# Patient Record
Sex: Male | Born: 1935 | Race: White | Hispanic: No | Marital: Married | Smoking: Never smoker
Health system: Southern US, Community
[De-identification: ages and names within clinical notes are randomized; demographics above are authoritative.]

## PROBLEM LIST (undated history)

## (undated) DIAGNOSIS — E538 Deficiency of other specified B group vitamins: Secondary | ICD-10-CM

## (undated) DIAGNOSIS — M79606 Pain in leg, unspecified: Secondary | ICD-10-CM

## (undated) DIAGNOSIS — R011 Cardiac murmur, unspecified: Secondary | ICD-10-CM

## (undated) DIAGNOSIS — E039 Hypothyroidism, unspecified: Secondary | ICD-10-CM

## (undated) DIAGNOSIS — I4891 Unspecified atrial fibrillation: Secondary | ICD-10-CM

## (undated) DIAGNOSIS — I2510 Atherosclerotic heart disease of native coronary artery without angina pectoris: Secondary | ICD-10-CM

## (undated) DIAGNOSIS — I495 Sick sinus syndrome: Secondary | ICD-10-CM

## (undated) DIAGNOSIS — Z87442 Personal history of urinary calculi: Secondary | ICD-10-CM

## (undated) DIAGNOSIS — I10 Essential (primary) hypertension: Secondary | ICD-10-CM

## (undated) HISTORY — DX: Personal history of urinary calculi: Z87.442

## (undated) HISTORY — DX: Sick sinus syndrome: I49.5

## (undated) HISTORY — DX: Deficiency of other specified B group vitamins: E53.8

## (undated) HISTORY — PX: ANGIOPLASTY: SHX39

## (undated) HISTORY — DX: Essential (primary) hypertension: I10

## (undated) HISTORY — DX: Cardiac murmur, unspecified: R01.1

## (undated) HISTORY — DX: Pain in leg, unspecified: M79.606

## (undated) HISTORY — DX: Atherosclerotic heart disease of native coronary artery without angina pectoris: I25.10

---

## 1998-01-31 ENCOUNTER — Encounter (HOSPITAL_COMMUNITY): Admission: RE | Admit: 1998-01-31 | Discharge: 1998-05-01 | Payer: Self-pay | Admitting: Cardiology

## 1998-05-03 ENCOUNTER — Encounter (HOSPITAL_COMMUNITY): Admission: RE | Admit: 1998-05-03 | Discharge: 1998-08-01 | Payer: Self-pay | Admitting: Cardiology

## 1998-08-02 ENCOUNTER — Encounter (HOSPITAL_COMMUNITY): Admission: RE | Admit: 1998-08-02 | Discharge: 1998-10-31 | Payer: Self-pay | Admitting: Cardiology

## 1998-11-01 ENCOUNTER — Encounter (HOSPITAL_COMMUNITY): Admission: RE | Admit: 1998-11-01 | Discharge: 1999-01-30 | Payer: Self-pay | Admitting: Cardiology

## 1999-01-31 ENCOUNTER — Encounter (HOSPITAL_COMMUNITY): Admission: RE | Admit: 1999-01-31 | Discharge: 1999-05-01 | Payer: Self-pay | Admitting: Cardiology

## 1999-02-08 ENCOUNTER — Encounter: Payer: Self-pay | Admitting: Emergency Medicine

## 1999-02-08 ENCOUNTER — Emergency Department (HOSPITAL_COMMUNITY): Admission: EM | Admit: 1999-02-08 | Discharge: 1999-02-08 | Payer: Self-pay | Admitting: Diagnostic Radiology

## 1999-02-09 ENCOUNTER — Encounter: Payer: Self-pay | Admitting: Emergency Medicine

## 1999-02-09 ENCOUNTER — Emergency Department (HOSPITAL_COMMUNITY): Admission: EM | Admit: 1999-02-09 | Discharge: 1999-02-09 | Payer: Self-pay | Admitting: Emergency Medicine

## 1999-02-14 ENCOUNTER — Encounter: Payer: Self-pay | Admitting: Family Medicine

## 1999-02-14 ENCOUNTER — Ambulatory Visit (HOSPITAL_COMMUNITY): Admission: RE | Admit: 1999-02-14 | Discharge: 1999-02-14 | Payer: Self-pay | Admitting: Family Medicine

## 1999-05-02 ENCOUNTER — Encounter (HOSPITAL_COMMUNITY): Admission: RE | Admit: 1999-05-02 | Discharge: 1999-06-02 | Payer: Self-pay | Admitting: Cardiology

## 1999-06-03 ENCOUNTER — Encounter: Admission: RE | Admit: 1999-06-03 | Discharge: 1999-09-01 | Payer: Self-pay | Admitting: Cardiology

## 1999-08-11 ENCOUNTER — Ambulatory Visit (HOSPITAL_COMMUNITY): Admission: RE | Admit: 1999-08-11 | Discharge: 1999-08-11 | Payer: Self-pay | Admitting: Cardiology

## 1999-08-11 ENCOUNTER — Encounter: Payer: Self-pay | Admitting: Cardiology

## 2000-05-21 ENCOUNTER — Inpatient Hospital Stay (HOSPITAL_COMMUNITY): Admission: AD | Admit: 2000-05-21 | Discharge: 2000-05-24 | Payer: Self-pay | Admitting: Cardiology

## 2000-05-21 ENCOUNTER — Encounter: Payer: Self-pay | Admitting: Cardiology

## 2000-09-16 ENCOUNTER — Encounter: Payer: Self-pay | Admitting: Cardiology

## 2000-09-16 ENCOUNTER — Inpatient Hospital Stay (HOSPITAL_COMMUNITY): Admission: AD | Admit: 2000-09-16 | Discharge: 2000-09-19 | Payer: Self-pay | Admitting: Cardiology

## 2001-06-16 ENCOUNTER — Emergency Department (HOSPITAL_COMMUNITY): Admission: EM | Admit: 2001-06-16 | Discharge: 2001-06-16 | Payer: Self-pay | Admitting: Emergency Medicine

## 2001-06-16 ENCOUNTER — Encounter: Payer: Self-pay | Admitting: Emergency Medicine

## 2001-10-21 ENCOUNTER — Ambulatory Visit (HOSPITAL_BASED_OUTPATIENT_CLINIC_OR_DEPARTMENT_OTHER): Admission: RE | Admit: 2001-10-21 | Discharge: 2001-10-21 | Payer: Self-pay | Admitting: Orthopedic Surgery

## 2002-07-08 ENCOUNTER — Encounter: Payer: Self-pay | Admitting: Family Medicine

## 2002-07-08 ENCOUNTER — Ambulatory Visit (HOSPITAL_COMMUNITY): Admission: RE | Admit: 2002-07-08 | Discharge: 2002-07-08 | Payer: Self-pay | Admitting: Family Medicine

## 2003-05-09 ENCOUNTER — Emergency Department (HOSPITAL_COMMUNITY): Admission: EM | Admit: 2003-05-09 | Discharge: 2003-05-09 | Payer: Self-pay | Admitting: Emergency Medicine

## 2003-05-09 ENCOUNTER — Encounter: Payer: Self-pay | Admitting: Emergency Medicine

## 2004-01-04 ENCOUNTER — Ambulatory Visit (HOSPITAL_COMMUNITY): Admission: RE | Admit: 2004-01-04 | Discharge: 2004-01-04 | Payer: Self-pay | Admitting: Family Medicine

## 2004-07-03 ENCOUNTER — Ambulatory Visit (HOSPITAL_COMMUNITY): Admission: RE | Admit: 2004-07-03 | Discharge: 2004-07-03 | Payer: Self-pay | Admitting: Cardiology

## 2004-08-05 ENCOUNTER — Encounter (INDEPENDENT_AMBULATORY_CARE_PROVIDER_SITE_OTHER): Payer: Self-pay | Admitting: *Deleted

## 2004-08-05 ENCOUNTER — Ambulatory Visit: Admission: RE | Admit: 2004-08-05 | Discharge: 2004-08-05 | Payer: Self-pay | Admitting: Critical Care Medicine

## 2004-09-04 ENCOUNTER — Ambulatory Visit: Payer: Self-pay | Admitting: Critical Care Medicine

## 2004-09-11 ENCOUNTER — Ambulatory Visit: Payer: Self-pay | Admitting: Cardiology

## 2004-09-12 ENCOUNTER — Ambulatory Visit: Payer: Self-pay | Admitting: Internal Medicine

## 2004-09-18 ENCOUNTER — Ambulatory Visit: Payer: Self-pay | Admitting: Internal Medicine

## 2004-09-23 ENCOUNTER — Ambulatory Visit: Payer: Self-pay

## 2004-10-02 ENCOUNTER — Ambulatory Visit: Payer: Self-pay | Admitting: Internal Medicine

## 2004-10-16 ENCOUNTER — Ambulatory Visit: Payer: Self-pay | Admitting: *Deleted

## 2004-11-06 ENCOUNTER — Ambulatory Visit: Payer: Self-pay | Admitting: Internal Medicine

## 2004-12-04 ENCOUNTER — Ambulatory Visit: Payer: Self-pay | Admitting: Critical Care Medicine

## 2004-12-04 ENCOUNTER — Ambulatory Visit: Payer: Self-pay | Admitting: Cardiology

## 2004-12-05 ENCOUNTER — Encounter: Admission: RE | Admit: 2004-12-05 | Discharge: 2004-12-05 | Payer: Self-pay | Admitting: Critical Care Medicine

## 2004-12-19 ENCOUNTER — Ambulatory Visit: Payer: Self-pay | Admitting: Internal Medicine

## 2005-02-03 ENCOUNTER — Ambulatory Visit (HOSPITAL_COMMUNITY): Admission: RE | Admit: 2005-02-03 | Discharge: 2005-02-03 | Payer: Self-pay | Admitting: Internal Medicine

## 2005-02-05 ENCOUNTER — Ambulatory Visit: Payer: Self-pay | Admitting: Internal Medicine

## 2005-02-10 ENCOUNTER — Ambulatory Visit: Payer: Self-pay | Admitting: Cardiology

## 2005-02-17 ENCOUNTER — Ambulatory Visit: Payer: Self-pay | Admitting: Cardiology

## 2005-02-17 ENCOUNTER — Ambulatory Visit (HOSPITAL_COMMUNITY): Admission: RE | Admit: 2005-02-17 | Discharge: 2005-02-17 | Payer: Self-pay | Admitting: Internal Medicine

## 2005-02-26 ENCOUNTER — Ambulatory Visit: Payer: Self-pay | Admitting: Cardiology

## 2005-03-10 ENCOUNTER — Ambulatory Visit (HOSPITAL_COMMUNITY): Admission: RE | Admit: 2005-03-10 | Discharge: 2005-03-10 | Payer: Self-pay | Admitting: Internal Medicine

## 2005-03-19 ENCOUNTER — Ambulatory Visit: Payer: Self-pay | Admitting: Cardiology

## 2005-03-19 ENCOUNTER — Ambulatory Visit: Payer: Self-pay | Admitting: Critical Care Medicine

## 2005-04-16 ENCOUNTER — Ambulatory Visit: Payer: Self-pay | Admitting: Internal Medicine

## 2005-05-08 ENCOUNTER — Ambulatory Visit: Payer: Self-pay | Admitting: Cardiology

## 2005-05-29 ENCOUNTER — Ambulatory Visit: Payer: Self-pay | Admitting: Cardiology

## 2005-06-11 ENCOUNTER — Ambulatory Visit: Payer: Self-pay | Admitting: Internal Medicine

## 2005-07-02 ENCOUNTER — Ambulatory Visit: Payer: Self-pay | Admitting: Cardiology

## 2005-07-30 ENCOUNTER — Ambulatory Visit: Payer: Self-pay | Admitting: Cardiology

## 2005-08-27 ENCOUNTER — Ambulatory Visit: Payer: Self-pay | Admitting: Internal Medicine

## 2005-09-23 ENCOUNTER — Ambulatory Visit: Payer: Self-pay | Admitting: Cardiology

## 2005-10-16 ENCOUNTER — Ambulatory Visit: Payer: Self-pay | Admitting: Cardiology

## 2005-11-13 ENCOUNTER — Ambulatory Visit: Payer: Self-pay | Admitting: Internal Medicine

## 2006-01-13 ENCOUNTER — Ambulatory Visit: Payer: Self-pay | Admitting: Cardiology

## 2006-01-28 ENCOUNTER — Ambulatory Visit: Payer: Self-pay | Admitting: Internal Medicine

## 2006-02-18 ENCOUNTER — Ambulatory Visit: Payer: Self-pay | Admitting: Cardiology

## 2006-03-18 ENCOUNTER — Ambulatory Visit: Payer: Self-pay | Admitting: Cardiology

## 2006-04-15 ENCOUNTER — Ambulatory Visit: Payer: Self-pay | Admitting: Cardiology

## 2006-04-29 ENCOUNTER — Ambulatory Visit: Payer: Self-pay | Admitting: Cardiology

## 2006-05-13 ENCOUNTER — Ambulatory Visit: Payer: Self-pay | Admitting: Cardiology

## 2006-06-22 ENCOUNTER — Ambulatory Visit: Payer: Self-pay | Admitting: Cardiology

## 2006-07-20 ENCOUNTER — Ambulatory Visit: Payer: Self-pay | Admitting: Cardiology

## 2006-08-17 ENCOUNTER — Ambulatory Visit: Payer: Self-pay | Admitting: Cardiology

## 2006-09-07 ENCOUNTER — Ambulatory Visit: Payer: Self-pay | Admitting: *Deleted

## 2006-10-05 ENCOUNTER — Ambulatory Visit: Payer: Self-pay | Admitting: Cardiology

## 2006-10-21 ENCOUNTER — Ambulatory Visit: Payer: Self-pay | Admitting: Cardiology

## 2006-10-28 ENCOUNTER — Ambulatory Visit: Payer: Self-pay | Admitting: Cardiology

## 2006-11-04 ENCOUNTER — Ambulatory Visit: Payer: Self-pay | Admitting: Cardiology

## 2006-11-25 ENCOUNTER — Ambulatory Visit: Payer: Self-pay | Admitting: Cardiology

## 2007-01-17 ENCOUNTER — Ambulatory Visit: Payer: Self-pay | Admitting: Internal Medicine

## 2007-02-15 ENCOUNTER — Ambulatory Visit: Payer: Self-pay | Admitting: *Deleted

## 2007-03-10 ENCOUNTER — Ambulatory Visit: Payer: Self-pay | Admitting: Cardiology

## 2007-03-24 ENCOUNTER — Ambulatory Visit: Payer: Self-pay | Admitting: Cardiology

## 2007-04-19 ENCOUNTER — Ambulatory Visit: Payer: Self-pay | Admitting: Cardiology

## 2007-05-17 ENCOUNTER — Ambulatory Visit: Payer: Self-pay | Admitting: Cardiovascular Disease

## 2007-06-17 ENCOUNTER — Ambulatory Visit: Payer: Self-pay | Admitting: Cardiology

## 2007-07-15 ENCOUNTER — Ambulatory Visit: Payer: Self-pay | Admitting: Cardiology

## 2007-07-27 ENCOUNTER — Ambulatory Visit: Payer: Self-pay | Admitting: Internal Medicine

## 2007-07-27 LAB — CONVERTED CEMR LAB: Vitamin B-12: 420 pg/mL (ref 211–911)

## 2007-08-12 ENCOUNTER — Ambulatory Visit: Payer: Self-pay | Admitting: Internal Medicine

## 2007-09-09 ENCOUNTER — Ambulatory Visit: Payer: Self-pay | Admitting: Cardiology

## 2007-09-09 LAB — CONVERTED CEMR LAB
ALT: 20 units/L (ref 0–53)
AST: 22 units/L (ref 0–37)
Albumin: 3.7 g/dL (ref 3.5–5.2)
Alkaline Phosphatase: 55 units/L (ref 39–117)
Bilirubin, Direct: 0.1 mg/dL (ref 0.0–0.3)
Cholesterol: 123 mg/dL (ref 0–200)
HDL: 38.4 mg/dL — ABNORMAL LOW (ref >39.0)
LDL Cholesterol: 65 mg/dL (ref 0–99)
Total Bilirubin: 0.8 mg/dL (ref 0.3–1.2)
Total CHOL/HDL Ratio: 3.2
Total Protein: 6.9 g/dL (ref 6.0–8.3)
Triglycerides: 97 mg/dL (ref 0–149)
VLDL: 19 mg/dL (ref 0–40)

## 2007-10-04 ENCOUNTER — Ambulatory Visit: Payer: Self-pay | Admitting: Cardiology

## 2007-11-01 ENCOUNTER — Ambulatory Visit: Payer: Self-pay | Admitting: Cardiology

## 2007-11-10 ENCOUNTER — Ambulatory Visit: Payer: Self-pay | Admitting: Cardiology

## 2007-11-11 ENCOUNTER — Ambulatory Visit: Payer: Self-pay | Admitting: Cardiology

## 2007-11-11 LAB — CONVERTED CEMR LAB
ALT: 29 units/L (ref 0–53)
AST: 28 units/L (ref 0–37)
Albumin: 4.1 g/dL (ref 3.5–5.2)
Alkaline Phosphatase: 58 units/L (ref 39–117)
BUN: 27 mg/dL — ABNORMAL HIGH (ref 6–23)
Basophils Absolute: 0 10*3/uL (ref 0.0–0.1)
Basophils Relative: 0.6 % (ref 0.0–1.0)
Bilirubin, Direct: 0.1 mg/dL (ref 0.0–0.3)
CO2: 29 meq/L (ref 19–32)
Calcium: 9.4 mg/dL (ref 8.4–10.5)
Chloride: 103 meq/L (ref 96–112)
Cholesterol: 160 mg/dL (ref 0–200)
Creatinine, Ser: 1.4 mg/dL (ref 0.4–1.5)
Eosinophils Absolute: 0.2 10*3/uL (ref 0.0–0.6)
Eosinophils Relative: 2.2 % (ref 0.0–5.0)
GFR calc Af Amer: 62 mL/min
GFR calc non Af Amer: 52 mL/min
Glucose, Bld: 124 mg/dL — ABNORMAL HIGH (ref 70–99)
HCT: 40.9 % (ref 39.0–52.0)
HDL: 48.9 mg/dL (ref >39.0)
Hemoglobin: 14.1 g/dL (ref 13.0–17.0)
LDL Cholesterol: 83 mg/dL (ref 0–99)
Lymphocytes Relative: 26.1 % (ref 12.0–46.0)
MCHC: 34.5 g/dL (ref 30.0–36.0)
MCV: 98.4 fL (ref 78.0–100.0)
Monocytes Absolute: 0.8 10*3/uL — ABNORMAL HIGH (ref 0.2–0.7)
Monocytes Relative: 10.9 % (ref 3.0–11.0)
Neutro Abs: 4.5 10*3/uL (ref 1.4–7.7)
Neutrophils Relative %: 60.2 % (ref 43.0–77.0)
Platelets: 238 10*3/uL (ref 150–400)
Potassium: 5.3 meq/L — ABNORMAL HIGH (ref 3.5–5.1)
RBC: 4.16 M/uL — ABNORMAL LOW (ref 4.22–5.81)
RDW: 12.9 % (ref 11.5–14.6)
Sodium: 140 meq/L (ref 135–145)
TSH: 0.34 microintl units/mL — ABNORMAL LOW (ref 0.35–5.50)
Total Bilirubin: 1.2 mg/dL (ref 0.3–1.2)
Total CHOL/HDL Ratio: 3.3
Total Protein: 7.5 g/dL (ref 6.0–8.3)
Triglycerides: 140 mg/dL (ref 0–149)
VLDL: 28 mg/dL (ref 0–40)
WBC: 7.5 10*3/uL (ref 4.5–10.5)

## 2007-11-18 ENCOUNTER — Ambulatory Visit: Payer: Self-pay | Admitting: Cardiology

## 2007-12-26 ENCOUNTER — Ambulatory Visit: Payer: Self-pay | Admitting: Cardiology

## 2007-12-26 ENCOUNTER — Ambulatory Visit: Payer: Self-pay | Admitting: Internal Medicine

## 2007-12-26 LAB — CONVERTED CEMR LAB
BUN: 19 mg/dL (ref 6–23)
CO2: 29 meq/L (ref 19–32)
Calcium: 9.2 mg/dL (ref 8.4–10.5)
Chloride: 103 meq/L (ref 96–112)
Creatinine, Ser: 1.3 mg/dL (ref 0.4–1.5)
GFR calc Af Amer: 68 mL/min
GFR calc non Af Amer: 56 mL/min
Glucose, Bld: 135 mg/dL — ABNORMAL HIGH (ref 70–99)
Potassium: 4.2 meq/L (ref 3.5–5.1)
Sodium: 140 meq/L (ref 135–145)
TSH: 1.74 microintl units/mL (ref 0.35–5.50)

## 2008-01-13 DIAGNOSIS — Z9861 Coronary angioplasty status: Secondary | ICD-10-CM

## 2008-01-13 DIAGNOSIS — I2510 Atherosclerotic heart disease of native coronary artery without angina pectoris: Secondary | ICD-10-CM

## 2008-01-13 DIAGNOSIS — E538 Deficiency of other specified B group vitamins: Secondary | ICD-10-CM

## 2008-01-13 DIAGNOSIS — I10 Essential (primary) hypertension: Secondary | ICD-10-CM

## 2008-01-13 DIAGNOSIS — Z87442 Personal history of urinary calculi: Secondary | ICD-10-CM

## 2008-01-24 ENCOUNTER — Ambulatory Visit: Payer: Self-pay | Admitting: Cardiology

## 2008-02-21 ENCOUNTER — Ambulatory Visit: Payer: Self-pay | Admitting: Cardiology

## 2008-03-20 ENCOUNTER — Ambulatory Visit: Payer: Self-pay | Admitting: Internal Medicine

## 2008-04-12 ENCOUNTER — Ambulatory Visit: Payer: Self-pay | Admitting: Cardiology

## 2008-05-10 ENCOUNTER — Ambulatory Visit: Payer: Self-pay | Admitting: Cardiology

## 2008-06-07 ENCOUNTER — Ambulatory Visit: Payer: Self-pay | Admitting: Cardiology

## 2008-07-05 ENCOUNTER — Ambulatory Visit: Payer: Self-pay | Admitting: Cardiology

## 2008-07-10 ENCOUNTER — Ambulatory Visit: Payer: Self-pay | Admitting: Internal Medicine

## 2008-07-16 LAB — CONVERTED CEMR LAB: Vitamin B-12: 308 pg/mL (ref 211–911)

## 2008-07-21 ENCOUNTER — Emergency Department (HOSPITAL_COMMUNITY): Admission: EM | Admit: 2008-07-21 | Discharge: 2008-07-21 | Payer: Self-pay | Admitting: Emergency Medicine

## 2008-08-02 ENCOUNTER — Ambulatory Visit: Payer: Self-pay | Admitting: Internal Medicine

## 2008-08-23 ENCOUNTER — Ambulatory Visit: Payer: Self-pay | Admitting: Cardiology

## 2008-08-31 ENCOUNTER — Encounter: Admission: RE | Admit: 2008-08-31 | Discharge: 2008-08-31 | Payer: Self-pay | Admitting: Orthopedic Surgery

## 2008-09-20 ENCOUNTER — Ambulatory Visit: Payer: Self-pay | Admitting: Internal Medicine

## 2008-10-18 ENCOUNTER — Ambulatory Visit: Payer: Self-pay | Admitting: Cardiology

## 2008-11-06 ENCOUNTER — Ambulatory Visit: Payer: Self-pay | Admitting: Cardiology

## 2008-11-13 ENCOUNTER — Ambulatory Visit: Payer: Self-pay | Admitting: Cardiovascular Disease

## 2008-11-13 ENCOUNTER — Ambulatory Visit: Payer: Self-pay | Admitting: Cardiology

## 2008-11-13 LAB — CONVERTED CEMR LAB
ALT: 22 units/L (ref 0–53)
AST: 22 units/L (ref 0–37)
Albumin: 3.8 g/dL (ref 3.5–5.2)
Alkaline Phosphatase: 49 units/L (ref 39–117)
BUN: 26 mg/dL — ABNORMAL HIGH (ref 6–23)
Basophils Absolute: 0.1 10*3/uL (ref 0.0–0.1)
Basophils Relative: 1.3 % (ref 0.0–3.0)
Bilirubin, Direct: 0.1 mg/dL (ref 0.0–0.3)
CO2: 30 meq/L (ref 19–32)
Calcium: 9 mg/dL (ref 8.4–10.5)
Chloride: 106 meq/L (ref 96–112)
Cholesterol: 167 mg/dL (ref 0–200)
Creatinine, Ser: 1.4 mg/dL (ref 0.4–1.5)
Eosinophils Absolute: 0.2 10*3/uL (ref 0.0–0.7)
Eosinophils Relative: 3.2 % (ref 0.0–5.0)
GFR calc Af Amer: 62 mL/min
GFR calc non Af Amer: 51 mL/min
Glucose, Bld: 126 mg/dL — ABNORMAL HIGH (ref 70–99)
HCT: 38.1 % — ABNORMAL LOW (ref 39.0–52.0)
HDL: 43.7 mg/dL (ref >39.0)
Hemoglobin: 13 g/dL (ref 13.0–17.0)
LDL Cholesterol: 99 mg/dL (ref 0–99)
Lymphocytes Relative: 31.4 % (ref 12.0–46.0)
MCHC: 34.2 g/dL (ref 30.0–36.0)
MCV: 99.6 fL (ref 78.0–100.0)
Monocytes Absolute: 0.7 10*3/uL (ref 0.1–1.0)
Monocytes Relative: 11.3 % (ref 3.0–12.0)
Neutro Abs: 3.5 10*3/uL (ref 1.4–7.7)
Neutrophils Relative %: 52.8 % (ref 43.0–77.0)
Platelets: 190 10*3/uL (ref 150–400)
Potassium: 4.6 meq/L (ref 3.5–5.1)
RBC: 3.83 M/uL — ABNORMAL LOW (ref 4.22–5.81)
RDW: 12.7 % (ref 11.5–14.6)
Sodium: 140 meq/L (ref 135–145)
TSH: 6.57 microintl units/mL — ABNORMAL HIGH (ref 0.35–5.50)
Total Bilirubin: 0.7 mg/dL (ref 0.3–1.2)
Total CHOL/HDL Ratio: 3.8
Total Protein: 7.2 g/dL (ref 6.0–8.3)
Triglycerides: 120 mg/dL (ref 0–149)
VLDL: 24 mg/dL (ref 0–40)
WBC: 6.6 10*3/uL (ref 4.5–10.5)

## 2008-12-25 ENCOUNTER — Ambulatory Visit: Payer: Self-pay | Admitting: Cardiovascular Disease

## 2009-01-08 ENCOUNTER — Ambulatory Visit: Payer: Self-pay | Admitting: Cardiology

## 2009-01-11 ENCOUNTER — Ambulatory Visit: Payer: Self-pay | Admitting: Cardiology

## 2009-01-11 LAB — CONVERTED CEMR LAB: TSH: 2.93 microintl units/mL (ref 0.35–5.50)

## 2009-01-29 ENCOUNTER — Ambulatory Visit: Payer: Self-pay | Admitting: Cardiology

## 2009-02-26 ENCOUNTER — Ambulatory Visit: Payer: Self-pay | Admitting: Cardiology

## 2009-03-07 ENCOUNTER — Telehealth: Payer: Self-pay | Admitting: Internal Medicine

## 2009-03-12 ENCOUNTER — Ambulatory Visit: Payer: Self-pay | Admitting: Internal Medicine

## 2009-03-12 LAB — CONVERTED CEMR LAB: Vitamin B-12: 684 pg/mL (ref 211–911)

## 2009-03-15 ENCOUNTER — Ambulatory Visit: Payer: Self-pay | Admitting: Cardiology

## 2009-03-22 ENCOUNTER — Encounter: Admission: RE | Admit: 2009-03-22 | Discharge: 2009-03-22 | Payer: Self-pay | Admitting: Internal Medicine

## 2009-04-02 ENCOUNTER — Encounter: Payer: Self-pay | Admitting: *Deleted

## 2009-04-09 ENCOUNTER — Ambulatory Visit: Payer: Self-pay | Admitting: Cardiology

## 2009-04-09 LAB — CONVERTED CEMR LAB
POC INR: 1.7
Protime: 18.1

## 2009-05-07 ENCOUNTER — Encounter (INDEPENDENT_AMBULATORY_CARE_PROVIDER_SITE_OTHER): Payer: Self-pay | Admitting: Cardiology

## 2009-05-07 ENCOUNTER — Ambulatory Visit: Payer: Self-pay | Admitting: Cardiology

## 2009-05-07 LAB — CONVERTED CEMR LAB
POC INR: 2.4
Prothrombin Time: 18.7 s

## 2009-05-08 ENCOUNTER — Encounter: Payer: Self-pay | Admitting: *Deleted

## 2009-06-04 ENCOUNTER — Ambulatory Visit: Payer: Self-pay | Admitting: Cardiovascular Disease

## 2009-06-04 LAB — CONVERTED CEMR LAB
POC INR: 2.3
Prothrombin Time: 18.7 s

## 2009-06-28 ENCOUNTER — Telehealth: Payer: Self-pay | Admitting: Cardiovascular Disease

## 2009-06-28 ENCOUNTER — Ambulatory Visit: Payer: Self-pay | Admitting: Internal Medicine

## 2009-06-28 LAB — CONVERTED CEMR LAB: POC INR: 2.5

## 2009-06-30 ENCOUNTER — Telehealth (INDEPENDENT_AMBULATORY_CARE_PROVIDER_SITE_OTHER): Payer: Self-pay | Admitting: Physician Assistant

## 2009-07-24 ENCOUNTER — Telehealth: Payer: Self-pay | Admitting: Cardiovascular Disease

## 2009-07-25 ENCOUNTER — Ambulatory Visit: Payer: Self-pay | Admitting: Cardiology

## 2009-07-25 LAB — CONVERTED CEMR LAB: POC INR: 3.4

## 2009-08-08 ENCOUNTER — Ambulatory Visit: Payer: Self-pay | Admitting: Cardiology

## 2009-08-08 LAB — CONVERTED CEMR LAB: POC INR: 2.7

## 2009-09-05 ENCOUNTER — Ambulatory Visit: Payer: Self-pay | Admitting: Internal Medicine

## 2009-09-05 LAB — CONVERTED CEMR LAB: POC INR: 2.4

## 2009-10-03 ENCOUNTER — Ambulatory Visit: Payer: Self-pay | Admitting: Cardiology

## 2009-10-31 ENCOUNTER — Ambulatory Visit: Payer: Self-pay | Admitting: Cardiovascular Disease

## 2009-10-31 LAB — CONVERTED CEMR LAB: POC INR: 2.4

## 2009-11-05 ENCOUNTER — Ambulatory Visit: Payer: Self-pay | Admitting: Cardiology

## 2009-11-22 ENCOUNTER — Encounter: Payer: Self-pay | Admitting: Cardiology

## 2009-11-25 ENCOUNTER — Ambulatory Visit: Payer: Self-pay | Admitting: Cardiology

## 2009-11-27 ENCOUNTER — Encounter: Payer: Self-pay | Admitting: Cardiology

## 2009-11-28 LAB — CONVERTED CEMR LAB
BUN: 26 mg/dL — ABNORMAL HIGH (ref 6–23)
CO2: 28 meq/L (ref 19–32)
Calcium: 9.1 mg/dL (ref 8.4–10.5)
Chloride: 103 meq/L (ref 96–112)
Creatinine, Ser: 1.3 mg/dL (ref 0.4–1.5)
GFR calc non Af Amer: 55.88 mL/min (ref >60)
Glucose, Bld: 94 mg/dL (ref 70–99)
Potassium: 4.7 meq/L (ref 3.5–5.1)
Sodium: 139 meq/L (ref 135–145)

## 2009-12-03 ENCOUNTER — Ambulatory Visit: Payer: Self-pay | Admitting: Cardiology

## 2009-12-03 ENCOUNTER — Ambulatory Visit: Payer: Self-pay | Admitting: Internal Medicine

## 2009-12-03 LAB — CONVERTED CEMR LAB: POC INR: 2.3

## 2010-01-07 ENCOUNTER — Ambulatory Visit: Payer: Self-pay | Admitting: Cardiovascular Disease

## 2010-01-07 LAB — CONVERTED CEMR LAB: POC INR: 2.4

## 2010-01-30 ENCOUNTER — Telehealth: Payer: Self-pay | Admitting: Internal Medicine

## 2010-02-04 ENCOUNTER — Ambulatory Visit: Payer: Self-pay | Admitting: Cardiology

## 2010-02-04 LAB — CONVERTED CEMR LAB: POC INR: 1.8

## 2010-03-04 ENCOUNTER — Ambulatory Visit: Payer: Self-pay | Admitting: Cardiology

## 2010-03-04 LAB — CONVERTED CEMR LAB: POC INR: 2.1

## 2010-03-17 ENCOUNTER — Encounter: Payer: Self-pay | Admitting: Cardiovascular Disease

## 2010-04-01 ENCOUNTER — Ambulatory Visit: Payer: Self-pay | Admitting: Cardiology

## 2010-04-01 LAB — CONVERTED CEMR LAB: POC INR: 1.7

## 2010-04-11 ENCOUNTER — Telehealth: Payer: Self-pay | Admitting: Internal Medicine

## 2010-04-17 ENCOUNTER — Ambulatory Visit: Payer: Self-pay | Admitting: Cardiovascular Disease

## 2010-04-17 LAB — CONVERTED CEMR LAB: POC INR: 2.3

## 2010-05-08 ENCOUNTER — Ambulatory Visit: Payer: Self-pay | Admitting: Internal Medicine

## 2010-05-09 LAB — CONVERTED CEMR LAB: Vitamin B-12: 899 pg/mL (ref 211–911)

## 2010-05-15 ENCOUNTER — Ambulatory Visit: Payer: Self-pay | Admitting: Cardiology

## 2010-05-15 LAB — CONVERTED CEMR LAB: POC INR: 1.8

## 2010-06-05 ENCOUNTER — Ambulatory Visit: Payer: Self-pay | Admitting: Internal Medicine

## 2010-06-05 LAB — CONVERTED CEMR LAB: POC INR: 2.7

## 2010-06-19 ENCOUNTER — Encounter: Payer: Self-pay | Admitting: Cardiovascular Disease

## 2010-06-27 ENCOUNTER — Ambulatory Visit: Payer: Self-pay | Admitting: Internal Medicine

## 2010-06-27 LAB — CONVERTED CEMR LAB: POC INR: 2.2

## 2010-08-19 ENCOUNTER — Telehealth: Payer: Self-pay | Admitting: Cardiology

## 2010-08-20 ENCOUNTER — Ambulatory Visit: Payer: Self-pay | Admitting: Internal Medicine

## 2010-08-20 LAB — CONVERTED CEMR LAB: POC INR: 2.1

## 2010-09-16 ENCOUNTER — Ambulatory Visit: Payer: Self-pay | Admitting: Cardiovascular Disease

## 2010-09-16 LAB — CONVERTED CEMR LAB: POC INR: 1.9

## 2010-10-14 ENCOUNTER — Ambulatory Visit: Payer: Self-pay | Admitting: Internal Medicine

## 2010-11-11 ENCOUNTER — Ambulatory Visit: Admission: RE | Admit: 2010-11-11 | Discharge: 2010-11-11 | Payer: Self-pay | Source: Home / Self Care

## 2010-11-11 LAB — CONVERTED CEMR LAB: POC INR: 2.5

## 2010-11-12 ENCOUNTER — Encounter: Payer: Self-pay | Admitting: Cardiovascular Disease

## 2010-11-12 ENCOUNTER — Ambulatory Visit
Admission: RE | Admit: 2010-11-12 | Discharge: 2010-11-12 | Payer: Self-pay | Source: Home / Self Care | Attending: Cardiovascular Disease | Admitting: Cardiovascular Disease

## 2010-11-12 ENCOUNTER — Other Ambulatory Visit: Payer: Self-pay | Admitting: Internal Medicine

## 2010-11-12 ENCOUNTER — Ambulatory Visit
Admission: RE | Admit: 2010-11-12 | Discharge: 2010-11-12 | Payer: Self-pay | Source: Home / Self Care | Attending: Internal Medicine | Admitting: Internal Medicine

## 2010-11-12 LAB — VITAMIN B12: Vitamin B-12: 281 pg/mL (ref 211–911)

## 2010-12-04 NOTE — Progress Notes (Signed)
Summary: refill request   Phone Note Refill Request Message from:  Patient on August 19, 2010 9:58 AM  Refills Requested: Medication #1:  QUINAPRIL HCL 40 MG TABS 2 tablet by mouth once daily [facility]/pt states this was req 10-15 and needs called in asap he is out   Method Requested: Telephone to Pharmacy Initial call taken by: Glynda Jaeger,  August 19, 2010 9:59 AM    Prescriptions: QUINAPRIL HCL 40 MG TABS (QUINAPRIL HCL) 2 tablet by mouth once daily  #60 x 6   Entered by:   Burnett Kanaris, CNA   Authorized by:   Lenoria Farrier, MD, [facility]   Signed by:   Burnett Kanaris, CNA on 08/19/2010   Method used:   Electronically to        [facility]  [phone]* (retail)       [address] [place], [place]  [address]       Ph: [phone] or [phone]       Fax: [phone]   RxID:   [id number]

## 2010-12-04 NOTE — Medication Information (Signed)
Summary: rov-tp  Anticoagulant Therapy  Managed by: Cloyde Reams, RN, BSN Referring MD: Dr Unknown PCP: Lucky Cowboy,  MD Supervising MD: Gala Romney MD, Reuel Boom Indication 1: Atrial Fibrillation (ICD-427.31) Indication 2: Patient has 2.5 mg tablets (ICD-2.5) Lab Used: [facility]  Site: [facility] INR POC 2.3 INR RANGE 2 - 3  Dietary changes: no    Health status changes: no    Bleeding/hemorrhagic complications: no    Recent/future hospitalizations: no    Any changes in medication regimen? no    Recent/future dental: no  Any missed doses?: no       Is patient compliant with meds? yes       Allergies (verified): No Known Drug Allergies  Anticoagulation Management History:      The patient is taking warfarin and comes in today for a routine follow up visit.  Positive risk factors for bleeding include an age of 75 years or older.  The bleeding index is 'intermediate risk'.  Positive CHADS2 values include History of HTN and Age > 66 years old.  The start date was 05/23/2000.  Anticoagulation responsible provider: Karess Harner MD, Reuel Boom.  INR POC: 2.3.  Cuvette Lot#: [id number].  Exp: 02/2011.    Anticoagulation Management Assessment/Plan:      The patient's current anticoagulation dose is Warfarin sodium 2.5 mg tabs: Take as directed by coumadin clinic..  The target INR is 2 - 3.  The next INR is due 01/07/2010.  Anticoagulation instructions were given to patient.  Results were reviewed/authorized by Cloyde Reams, RN, BSN.  He was notified by Cloyde Reams RN.         Prior Anticoagulation Instructions: The patient is to continue with the same dose of coumadin.  This dosage includes:   Current Anticoagulation Instructions: INR 2.3  Continue on same dosage 2.5mg  daily.  Recheck in 4 weeks.

## 2010-12-04 NOTE — Assessment & Plan Note (Signed)
Summary: f1y  Medications Added SIMVASTATIN 40 MG TABS (SIMVASTATIN) 1/2 tab by mouth once daily      Allergies Added: NKDA  Primary Provider:  Lucky Cowboy,  MD  CC:  yearly visit.  History of Present Illness: The patient is 77 years old and return for management of CAD and paroxysmal atrial fibrillation. He has had multiple prior PCI procedures and was last catheterized in 2000. he has a history of paroxysmal atrial fibrillation and is on amiodarone and Coumadin.  He has done quite well over the past year and has had no recent chest pain shortness of breath or palpitations.  He does not check his blood pressures at home. His blood pressure was borderline on his last reading here in the office.   His wife had a stroke in September but seems to be recovering   Current Problems (verified): 1)  Murmur  (ICD-785.2) 2)  Encounter For Long-term Use of Other Medications  (ICD-V58.69) 3)  Special Screening For Malignant Neoplasms Colon  (ICD-V76.51) 4)  Vitamin B12 Deficiency  (ICD-266.2) 5)  Nephrolithiasis, Hx of  (ICD-V13.01) 6)  Atrial Fibrillation, Paroxysmal, Hx of  (ICD-V12.50) 7)  Sinus Bradycardia  (ICD-427.81) 8)  Coronary Artery Disease  (ICD-414.00) 9)  Hypertension  (ICD-401.9)  Current Medications (verified): 1)  Amiodarone Hcl 200 Mg Tabs (Amiodarone Hcl) .... 1/2  Tablet By Mouth Once Daily 2)  Simvastatin 40 Mg Tabs (Simvastatin) .... 1/2 Tab By Mouth Once Daily 3)  Synthroid 75 Mcg Tabs (Levothyroxine Sodium) .... Take 1 Tablet Daily 4)  Warfarin Sodium 2.5 Mg Tabs (Warfarin Sodium) .... Take As Directed By Coumadin Clinic. 5)  Aspirin 81 Mg Tbec (Aspirin) .Marland Kitchen.. 1 Tablet By Mouth Once Daily 6)  Quinapril Hcl 40 Mg Tabs (Quinapril Hcl) .... 2 Tablet By Mouth Once Daily 7)  Amlodipine Besylate 5 Mg Tabs (Amlodipine Besylate) .... Take One Tablet By Mouth Daily  Allergies (verified): No Known Drug Allergies  Past History:  Past Medical History: Last updated:  01/13/2008 VITAMIN B12 DEFICIENCY (ICD-266.2) NEPHROLITHIASIS, HX OF (ICD-V13.01) ATRIAL FIBRILLATION, PAROXYSMAL, HX OF (ICD-V12.50) SINUS BRADYCARDIA (ICD-427.81) CORONARY ARTERY DISEASE (ICD-414.00) HYPERTENSION (ICD-401.9)  Past Surgical History: Last updated: 01/13/2008 RCA Angioplasty (1995)  Family History: Last updated: 03/12/2009 Family History of Colitis/Crohn's: Father Family History of Colon Polyps: Uncle Family History of Heart Disease: 3 Brothers, Father, Mother No FH of Colon Cancer:  Social History: Last updated: 03/12/2009 Alcohol Use - no Illicit Drug Use - no Occupation: retired Patient has never smoked.   Review of Systems       Denies fever, malais, weight loss, blurry vision, decreased visual acuity, cough, sputum, SOB, hemoptysis, pleuritic pain, palpitaitons, heartburn, abdominal pain, melena, lower extremity edema, claudication, or rash.   Vital Signs:  Patient profile:   77 year old male Height:      67 inches Weight:      160 pounds BMI:     25.15 Pulse rate:   66 / minute Resp:     14 per minute BP sitting:   120 / 68  (right arm)  Vitals Entered By: Kem Parkinson (November 12, 2010 9:53 AM)  Physical Exam  General:  Affect appropriate Healthy:  appears stated age HEENT: normal Neck supple with no adenopathy JVP normal no bruits no thyromegaly Lungs clear with no wheezing and good diaphragmatic motion Heart:  S1/S2 systolic  murmur,rub, gallop or click PMI normal Abdomen: benighn, BS positve, no tenderness, no AAA no bruit.  No HSM or HJR Distal  pulses intact with no bruits No edema Neuro non-focal Skin warm and dry    Impression & Recommendations:  Problem # 1:  MURMUR (ICD-785.2) AV sclerosis  no need for echo at this time His updated medication list for this problem includes:    Quinapril Hcl 40 Mg Tabs (Quinapril hcl) .Marland Kitchen... 2 tablet by mouth once daily  Problem # 2:  ATRIAL FIBRILLATION, PAROXYSMAL, HX OF  (ICD-V12.50) Maint NSR with no palpitations.  Continue F/U in coumadin clinic.  INR's Rx and still gets around well with low fall risk  Problem # 3:  CORONARY ARTERY DISEASE (ICD-414.00) Stable no angina Continue baby aspirin  His updated medication list for this problem includes:    Warfarin Sodium 2.5 Mg Tabs (Warfarin sodium) .Marland Kitchen... Take as directed by coumadin clinic.    Aspirin 81 Mg Tbec (Aspirin) .Marland Kitchen... 1 tablet by mouth once daily    Quinapril Hcl 40 Mg Tabs (Quinapril hcl) .Marland Kitchen... 2 tablet by mouth once daily    Amlodipine Besylate 5 Mg Tabs (Amlodipine besylate) .Marland Kitchen... Take one tablet by mouth daily  Problem # 4:  HYPERTENSION (ICD-401.9) Assessment: Comment Only Well contolled.  Follow HR may need to stop amlodimpine secondary to bradycardia His updated medication list for this problem includes:    Aspirin 81 Mg Tbec (Aspirin) .Marland Kitchen... 1 tablet by mouth once daily    Quinapril Hcl 40 Mg Tabs (Quinapril hcl) .Marland Kitchen... 2 tablet by mouth once daily    Amlodipine Besylate 5 Mg Tabs (Amlodipine besylate) .Marland Kitchen... Take one tablet by mouth daily  Other Orders: EKG w/ Interpretation (93000)  Patient Instructions: 1)  Your physician recommends that you schedule a follow-up appointment in: 6 MONTHS WITH DR Eden Emms 2)  Your physician recommends that you continue on your current medications as directed. Please refer to the Current Medication list given to you today.   EKG Report  Procedure date:  11/12/2010  Findings:      NSR 66 PR 220 LAD

## 2010-12-04 NOTE — Medication Information (Signed)
Summary: rov/tm   Anticoagulant Therapy  Managed by: Bethena Midget, RN, BSN Referring MD: Eden Emms MD, Theron Arista PCP: Lucky Cowboy,  MD Supervising MD: Shirlee Latch MD, Dalton Indication 1: Atrial Fibrillation (ICD-427.31) Indication 2: Patient has 2.5 mg tablets (ICD-2.5) Lab Used: [facility] Site: [facility] INR RANGE 2 - 3  Dietary changes: no    Health status changes: no    Bleeding/hemorrhagic complications: no    Recent/future hospitalizations: no    Any changes in medication regimen? no    Recent/future dental: no  Any missed doses?: no       Is patient compliant with meds? yes       Allergies: No Known Drug Allergies  Anticoagulation Management History:      Positive risk factors for bleeding include an age of 75 years or older.  The bleeding index is 'intermediate risk'.  Positive CHADS2 values include History of HTN and Age > 73 years old.  The start date was 05/23/2000.  Today's INR is 2.1.  Anticoagulation responsible provider: Shirlee Latch MD, Dalton.  Cuvette Lot#: [id number].  Exp: 11/2011.    Anticoagulation Management Assessment/Plan:      The patient's current anticoagulation dose is Warfarin sodium 2.5 mg tabs: Take as directed by coumadin clinic..  The target INR is 2 - 3.  The next INR is due 11/11/2010.  Anticoagulation instructions were given to patient.  Results were reviewed/authorized by Bethena Midget, RN, BSN.         Prior Anticoagulation Instructions: INR 1.9 Today take extra 2.5mg s then resume 2.5mg s everyday. Recheck in 4 weeks.   Current Anticoagulation Instructions: INR 2.1 The patient is to continue with the same dose of coumadin.  This dosage includes:  Take 1 tablet (2.5mg ) every day Recheck INR in 4 weeks

## 2010-12-04 NOTE — Medication Information (Signed)
Summary: rov/cb  Anticoagulant Therapy  Managed by: Eda Keys, PharmD Referring MD: Dr Unknown PCP: Lucky Cowboy,  MD Supervising MD: Shirlee Latch MD, Pearson Reasons Indication 1: Atrial Fibrillation (ICD-427.31) Indication 2: Patient has 2.5 mg tablets (ICD-2.5) Lab Used: [facility] Site: [facility] INR POC 2.1 INR RANGE 2 - 3  Dietary changes: no    Health status changes: no    Bleeding/hemorrhagic complications: no    Recent/future hospitalizations: no    Any changes in medication regimen? no    Recent/future dental: no  Any missed doses?: no       Is patient compliant with meds? yes       Allergies: No Known Drug Allergies  Anticoagulation Management History:      The patient is taking warfarin and comes in today for a routine follow up visit.  Positive risk factors for bleeding include an age of 75 years or older.  The bleeding index is 'intermediate risk'.  Positive CHADS2 values include History of HTN and Age > 66 years old.  The start date was 05/23/2000.  Anticoagulation responsible provider: Shirlee Latch MD, Darrien Laakso.  INR POC: 2.1.  Cuvette Lot#: [id number].  Exp: 04/2011.    Anticoagulation Management Assessment/Plan:      The patient's current anticoagulation dose is Warfarin sodium 2.5 mg tabs: Take as directed by coumadin clinic..  The target INR is 2 - 3.  The next INR is due 04/01/2010.  Anticoagulation instructions were given to patient.  Results were reviewed/authorized by Eda Keys, PharmD.  He was notified by Eda Keys.         Prior Anticoagulation Instructions: INR 1.8. Take an extra tablet today, then take 1 tablet daily.  Current Anticoagulation Instructions: INR 2.1  Continue taking 1 tablet every day.  Return to clinic in 4 weeks.

## 2010-12-04 NOTE — Medication Information (Signed)
Summary: rov/ewj  Anticoagulant Therapy  Managed by: Elaina Pattee, PharmD Referring MD: Dr Unknown PCP: Lucky Cowboy,  MD Supervising MD: Graciela Husbands MD, Viviann Spare Indication 1: Atrial Fibrillation (ICD-427.31) Indication 2: Patient has 2.5 mg tablets (ICD-2.5) Lab Used: [facility] Site: [facility] INR POC 1.8 INR RANGE 2 - 3  Dietary changes: no    Health status changes: no    Bleeding/hemorrhagic complications: no    Recent/future hospitalizations: no    Any changes in medication regimen? no    Recent/future dental: no  Any missed doses?: no       Is patient compliant with meds? yes       Allergies: No Known Drug Allergies  Anticoagulation Management History:      The patient is taking warfarin and comes in today for a routine follow up visit.  Positive risk factors for bleeding include an age of 5 years or older.  The bleeding index is 'intermediate risk'.  Positive CHADS2 values include History of HTN and Age > 80 years old.  The start date was 05/23/2000.  Anticoagulation responsible provider: Graciela Husbands MD, Viviann Spare.  INR POC: 1.8.  Cuvette Lot#: [id number].  Exp: 03/2011.    Anticoagulation Management Assessment/Plan:      The patient's current anticoagulation dose is Warfarin sodium 2.5 mg tabs: Take as directed by coumadin clinic..  The target INR is 2 - 3.  The next INR is due 03/04/2010.  Anticoagulation instructions were given to patient.  Results were reviewed/authorized by Elaina Pattee, PharmD.  He was notified by Elaina Pattee, PharmD.         Prior Anticoagulation Instructions: INR 2.4  Continue on same dosage 2.73mf daily.  Recheck in 4 weeks.    Current Anticoagulation Instructions: INR 1.8. Take an extra tablet today, then take 1 tablet daily.

## 2010-12-04 NOTE — Assessment & Plan Note (Signed)
Summary: bp check  Nurse Visit   Vital Signs:  Patient profile:   76 year old male Height:      67 inches (170.18 cm) Weight:      167.4 pounds (76.09 kg) Pulse rate:   80 / minute BP sitting:   128 / 48  (left arm)  Vitals Entered By: Dennis Bast, RN, BSN (December 03, 2009 3:03 PM)   Current Medications (verified): 1)  Cyanocobalamin 1000 Mcg/ml Soln (Cyanocobalamin) .... Inject 1 Ml Intramuscularly Into Deltoid Once Per Month. Pharmacy-Please Include Appropriate Syringes As Well. 2)  Amiodarone Hcl 200 Mg Tabs (Amiodarone Hcl) .... 1/2  Tablet By Mouth Once Daily 3)  Simvastatin 20 Mg Tabs (Simvastatin) .... Take One Tablet By Mouth Daily At Bedtime 4)  Synthroid 75 Mcg Tabs (Levothyroxine Sodium) .... Take 1 Tablet Daily 5)  Warfarin Sodium 2.5 Mg Tabs (Warfarin Sodium) .... Take As Directed By Coumadin Clinic. 6)  Aspirin 81 Mg Tbec (Aspirin) .Marland Kitchen.. 1 Tablet By Mouth Once Daily 7)  Quinapril Hcl 40 Mg Tabs (Quinapril Hcl) .... 2 Tablet By Mouth Once Daily 8)  Amlodipine Besylate 5 Mg Tabs (Amlodipine Besylate) .... Take One Tablet By Mouth Daily  Allergies (verified): No Known Drug Allergies   Visit Type:  BP Check  CC:  no problems/ All labs checked at Dr Kathryne Sharper office.

## 2010-12-04 NOTE — Medication Information (Signed)
Summary: rov/tm  Anticoagulant Therapy  Managed by: Bethena Midget, RN, BSN Referring MD: Eden Emms MD, Theron Arista PCP: Lucky Cowboy,  MD Supervising MD: Excell Seltzer MD, Casimiro Needle Indication 1: Atrial Fibrillation (ICD-427.31) Indication 2: Patient has 2.5 mg tablets (ICD-2.5) Lab Used: [facility] Site: [facility] INR POC 1.9 INR RANGE 2 - 3  Dietary changes: yes       Details: Did eat more green leafy veggies  Health status changes: no    Bleeding/hemorrhagic complications: no    Recent/future hospitalizations: no    Any changes in medication regimen? no    Recent/future dental: no  Any missed doses?: no       Is patient compliant with meds? yes       Allergies: No Known Drug Allergies  Anticoagulation Management History:      The patient is taking warfarin and comes in today for a routine follow up visit.  Positive risk factors for bleeding include an age of 75 years or older.  The bleeding index is 'intermediate risk'.  Positive CHADS2 values include History of HTN and Age > 36 years old.  The start date was 05/23/2000.  Anticoagulation responsible provider: Excell Seltzer MD, Casimiro Needle.  INR POC: 1.9.  Cuvette Lot#: [id number].  Exp: 10/2011.    Anticoagulation Management Assessment/Plan:      The patient's current anticoagulation dose is Warfarin sodium 2.5 mg tabs: Take as directed by coumadin clinic..  The target INR is 2 - 3.  The next INR is due 10/14/2010.  Anticoagulation instructions were given to patient.  Results were reviewed/authorized by Bethena Midget, RN, BSN.  He was notified by Bethena Midget, RN, BSN.         Prior Anticoagulation Instructions: INR 2.1 Continue 2.5mg s daily. REcheck in 4 weeks.   Current Anticoagulation Instructions: INR 1.9 Today take extra 2.5mg s then resume 2.5mg s everyday. Recheck in 4 weeks.

## 2010-12-04 NOTE — Medication Information (Signed)
Summary: rov/tm  Anticoagulant Therapy  Managed by: Cloyde Reams, RN, BSN Referring MD: Dr Unknown PCP: Lucky Cowboy,  MD Supervising MD: Tenny Craw MD, Gunnar Fusi Indication 1: Atrial Fibrillation (ICD-427.31) Indication 2: Patient has 2.5 mg tablets (ICD-2.5) Lab Used: [facility] Site: [facility] INR POC 2.7 INR RANGE 2 - 3  Dietary changes: no    Health status changes: no    Bleeding/hemorrhagic complications: no    Recent/future hospitalizations: no    Any changes in medication regimen? no    Recent/future dental: no  Any missed doses?: no       Is patient compliant with meds? yes       Allergies: No Known Drug Allergies  Anticoagulation Management History:      The patient is taking warfarin and comes in today for a routine follow up visit.  Positive risk factors for bleeding include an age of 75 years or older.  The bleeding index is 'intermediate risk'.  Positive CHADS2 values include History of HTN and Age > 35 years old.  The start date was 05/23/2000.  Anticoagulation responsible provider: Tenny Craw MD, Gunnar Fusi.  INR POC: 2.7.  Cuvette Lot#: [id number].  Exp: 08/2011.    Anticoagulation Management Assessment/Plan:      The patient's current anticoagulation dose is Warfarin sodium 2.5 mg tabs: Take as directed by coumadin clinic..  The target INR is 2 - 3.  The next INR is due 06/27/2010.  Anticoagulation instructions were given to patient.  Results were reviewed/authorized by Cloyde Reams, RN, BSN.  He was notified by Cloyde Reams RN.         Prior Anticoagulation Instructions: INR 1.8 Today take extra 1/2 pill on Friday take 1 1/2 pills  then resume 1 pill everyday. Recheck in 3 weeks.   Current Anticoagulation Instructions: INR 2.7  Continue on same dosage 1 tablet daily.   Recheck in 4 weeks.

## 2010-12-04 NOTE — Assessment & Plan Note (Signed)
Summary: 1 YR F/U  Medications Added AMLODIPINE BESYLATE 5 MG TABS (AMLODIPINE BESYLATE) Take one tablet by mouth daily      Allergies Added: NKDA  Visit Type:  Follow-up Primary Provider:  Lucky Cowboy,  MD  CC:  no complaints.  History of Present Illness: The patient is 77 years old and return for management of CAD and paroxysmal atrial fibrillation. He has had multiple prior PCI procedures and was last catheterized in 2000. he has a history of paroxysmal atrial fibrillation and is on amiodarone and Coumadin.  He has done quite well over the past year and has had no recent chest pain shortness of breath or palpitations.  He does not check his blood pressures at home. His blood pressure was borderline on his last reading here in the office.  Current Medications (verified): 1)  Cyanocobalamin 1000 Mcg/ml Soln (Cyanocobalamin) .... Inject 1 Ml Intramuscularly Into Deltoid Once Per Month. Pharmacy-Please Include Appropriate Syringes As Well. 2)  Amiodarone Hcl 200 Mg Tabs (Amiodarone Hcl) .... 1/2  Tablet By Mouth Once Daily 3)  Simvastatin 20 Mg Tabs (Simvastatin) .... Take One Tablet By Mouth Daily At Bedtime 4)  Synthroid 75 Mcg Tabs (Levothyroxine Sodium) .... Take 1 Tablet Daily 5)  Warfarin Sodium 2.5 Mg Tabs (Warfarin Sodium) .... Take As Directed By Coumadin Clinic. 6)  Aspirin 81 Mg Tbec (Aspirin) .Marland Kitchen.. 1 Tablet By Mouth Once Daily 7)  Quinapril Hcl 40 Mg Tabs (Quinapril Hcl) .... 2 Tablet By Mouth Once Daily  Allergies (verified): No Known Drug Allergies  Past History:  Past Medical History: Reviewed history from 01/13/2008 and no changes required. VITAMIN B12 DEFICIENCY (ICD-266.2) NEPHROLITHIASIS, HX OF (ICD-V13.01) ATRIAL FIBRILLATION, PAROXYSMAL, HX OF (ICD-V12.50) SINUS BRADYCARDIA (ICD-427.81) CORONARY ARTERY DISEASE (ICD-414.00) HYPERTENSION (ICD-401.9)  Review of Systems       ROS is negative except as outlined in HPI.   Vital Signs:  Patient  profile:   77 year old male Height:      67 inches Weight:      164 pounds Pulse rate:   82 / minute BP sitting:   150 / 78  (left arm) Cuff size:   regular  Vitals Entered By: Burnett Kanaris, CNA (November 05, 2009 4:11 PM)  Physical Exam  Additional Exam:  Gen. Well-nourished, in no distress   Neck: No JVD, thyroid not enlarged, no carotid bruits Lungs: No tachypnea, clear without rales, rhonchi or wheezes Cardiovascular: Rhythm regular, PMI not displaced,  heart sounds  normal, no murmurs or gallops, no peripheral edema, pulses normal in all 4 extremities. Abdomen: BS normal, abdomen soft and non-tender without masses or organomegaly, no hepatosplenomegaly. MS: No deformities, no cyanosis or clubbing   Neuro:  No focal sns   Skin:  no lesions    Impression & Recommendations:  Problem # 1:  CORONARY ARTERY DISEASE (ICD-414.00) He has had multiple PCI procedures and his last catheterization was in 2000. His had no recent chest pain. This problem is stable. His updated medication list for this problem includes:    Warfarin Sodium 2.5 Mg Tabs (Warfarin sodium) .Marland Kitchen... Take as directed by coumadin clinic.    Aspirin 81 Mg Tbec (Aspirin) .Marland Kitchen... 1 tablet by mouth once daily    Quinapril Hcl 40 Mg Tabs (Quinapril hcl) .Marland Kitchen... 2 tablet by mouth once daily    Amlodipine Besylate 5 Mg Tabs (Amlodipine besylate) .Marland Kitchen... Take one tablet by mouth daily  Problem # 2:  ATRIAL FIBRILLATION, PAROXYSMAL, HX OF (ICD-V12.50) He has paroxysmal  atrial fibrillation managed with amiodarone and Coumadin. His had no symptomatic recurrences over the past year. We will continue current therapy.  Problem # 3:  HYPERTENSION (ICD-401.9) His blood pressure is elevated today. We will start amlodipine 5 mg daily. We will get a followup blood pressure check with his Coumadin check in 4 weeks. We also get blood work for amiodarone surveillance at that time. His updated medication list for this problem includes:    Aspirin  81 Mg Tbec (Aspirin) .Marland Kitchen... 1 tablet by mouth once daily    Quinapril Hcl 40 Mg Tabs (Quinapril hcl) .Marland Kitchen... 2 tablet by mouth once daily    Amlodipine Besylate 5 Mg Tabs (Amlodipine besylate) .Marland Kitchen... Take one tablet by mouth daily  Patient Instructions: 1)  Your physician recommends that you return for FASTING lab work on: Tues 12/03/09 @ 9:00am- lipid/liver/tsh/bmp/cbc (414.01; 427.31;402.10) 2)  Your physician has recommended you make the following change in your medication: 1) START norvasc (amlodipine) 5mg  once daily. 3)  Your physician wants you to follow-up in: 1 year with Dr. Eden Emms.  You will receive a reminder letter in the mail two months in advance. If you don't receive a letter, please call our office to schedule the follow-up appointment. Prescriptions: AMLODIPINE BESYLATE 5 MG TABS (AMLODIPINE BESYLATE) Take one tablet by mouth daily  #30 x 11   Entered by:   Sherri Rad, RN, BSN   Authorized by:   Lenoria Farrier, MD, [facility]   Signed by:   Sherri Rad, RN, BSN on 11/05/2009   Method used:   [facility]ron[facility] to        [facility]  [phone]* (retail)       [address] [place], [place]  [address]       Ph: [phone] or [phone]       Fax: [phone]   RxID:   [id number]

## 2010-12-04 NOTE — Medication Information (Signed)
Summary: rov/ewj   Anticoagulant Therapy  Managed by: Reina Fuse, PharmD Referring MD: Dr Unknown PCP: Lucky Cowboy,  MD Supervising MD: Tenny Craw MD, Gunnar Fusi Indication 1: Atrial Fibrillation (ICD-427.31) Indication 2: Patient has 2.5 mg tablets (ICD-2.5) Lab Used: [facility] Site: [facility] INR POC 2.2 INR RANGE 2 - 3  Dietary changes: no    Health status changes: no    Bleeding/hemorrhagic complications: no    Recent/future hospitalizations: no    Any changes in medication regimen? no    Recent/future dental: no  Any missed doses?: no       Is patient compliant with meds? yes       Allergies: No Known Drug Allergies  Anticoagulation Management History:      The patient is taking warfarin and comes in today for a routine follow up visit.  Positive risk factors for bleeding include an age of 35 years or older.  The bleeding index is 'intermediate risk'.  Positive CHADS2 values include History of HTN and Age > 63 years old.  The start date was 05/23/2000.  Anticoagulation responsible provider: Tenny Craw MD, Gunnar Fusi.  INR POC: 2.2.  Cuvette Lot#: [id number].  Exp: 08/2011.    Anticoagulation Management Assessment/Plan:      The patient's current anticoagulation dose is Warfarin sodium 2.5 mg tabs: Take as directed by coumadin clinic..  The target INR is 2 - 3.  The next INR is due 07/29/2010.  Anticoagulation instructions were given to patient.  Results were reviewed/authorized by Reina Fuse, PharmD.  He was notified by Reina Fuse PharmD.         Prior Anticoagulation Instructions: INR 2.7  Continue on same dosage 1 tablet daily.   Recheck in 4 weeks.    Current Anticoagulation Instructions: INR 2.2  Continue taking Coumadin 1 tab (2.5 mg) every day. Return to clinic in 4 weeks.

## 2010-12-04 NOTE — Medication Information (Signed)
Summary: [name]  Anticoagulant Therapy  Managed by: Cloyde Reams, RN, BSN Referring MD: Dr Unknown PCP: Lucky Cowboy,  MD Supervising MD: Gala Romney MD, Reuel Boom Indication 1: Atrial Fibrillation (ICD-427.31) Indication 2: Patient has 2.5 mg tablets (ICD-2.5) Lab Used: [facility] Site: [facility] INR POC 2.4 INR RANGE 2 - 3  Dietary changes: no    Health status changes: yes       Details: Pt fell on uneven pavement broke 2 fingers on L hand splinted by ortho.  Bleeding/hemorrhagic complications: no    Recent/future hospitalizations: no    Any changes in medication regimen? no    Recent/future dental: no  Any missed doses?: no       Is patient compliant with meds? yes       Allergies: No Known Drug Allergies  Anticoagulation Management History:      The patient is taking warfarin and comes in today for a routine follow up visit.  Positive risk factors for bleeding include an age of 75 years or older.  The bleeding index is 'intermediate risk'.  Positive CHADS2 values include History of HTN and Age > 42 years old.  The start date was 05/23/2000.  Anticoagulation responsible provider: Bensimhon MD, Reuel Boom.  INR POC: 2.4.  Cuvette Lot#: [id number].  Exp: 03/2011.    Anticoagulation Management Assessment/Plan:      The patient's current anticoagulation dose is Warfarin sodium 2.5 mg tabs: Take as directed by coumadin clinic..  The target INR is 2 - 3.  The next INR is due 02/04/2010.  Anticoagulation instructions were given to patient.  Results were reviewed/authorized by Cloyde Reams, RN, BSN.  He was notified by Cloyde Reams RN.         Prior Anticoagulation Instructions: INR 2.3  Continue on same dosage 2.5mg  daily.  Recheck in 4 weeks.    Current Anticoagulation Instructions: INR 2.4  Continue on same dosage 2.38mf daily.  Recheck in 4 weeks.

## 2010-12-04 NOTE — Medication Information (Signed)
Summary: rov/sp  Anticoagulant Therapy  Managed by: Bethena Midget, RN, BSN Referring MD: Dr Unknown PCP: Lucky Cowboy,  MD Supervising MD: Jens Som MD, Arlys John Indication 1: Atrial Fibrillation (ICD-427.31) Indication 2: Patient has 2.5 mg tablets (ICD-2.5) Lab Used: [facility] Site: [facility] INR POC 1.8 INR RANGE 2 - 3  Dietary changes: no    Health status changes: no    Bleeding/hemorrhagic complications: no    Recent/future hospitalizations: no    Any changes in medication regimen? no    Recent/future dental: no  Any missed doses?: no       Is patient compliant with meds? yes       Allergies: No Known Drug Allergies  Anticoagulation Management History:      The patient is taking warfarin and comes in today for a routine follow up visit.  Positive risk factors for bleeding include an age of 75 years or older.  The bleeding index is 'intermediate risk'.  Positive CHADS2 values include History of HTN and Age > 58 years old.  The start date was 05/23/2000.  Anticoagulation responsible provider: Jens Som MD, Arlys John.  INR POC: 1.8.  Cuvette Lot#: [id number].  Exp: 07/2011.    Anticoagulation Management Assessment/Plan:      The patient's current anticoagulation dose is Warfarin sodium 2.5 mg tabs: Take as directed by coumadin clinic..  The target INR is 2 - 3.  The next INR is due 06/05/2010.  Anticoagulation instructions were given to patient.  Results were reviewed/authorized by Bethena Midget, RN, BSN.  He was notified by Bethena Midget, RN, BSN.         Prior Anticoagulation Instructions: INR 2.3  Continue same dose of 1 tablet every day   Current Anticoagulation Instructions: INR 1.8 Today take extra 1/2 pill on Friday take 1 1/2 pills  then resume 1 pill everyday. Recheck in 3 weeks.

## 2010-12-04 NOTE — Progress Notes (Signed)
Summary: TRIAGE   Phone Note Call from Patient Call back at [phone] cell   Call For: Dr Juanda Chance Reason for Call: Talk to Nurse Summary of Call:  Pharmacy gave him a different syringe than he usuallty gets - so he took too much B 12. Will that give him side effects? Initial call taken by: Leanor Kail [facility],  January 30, 2010 9:55 AM  Follow-up for Phone Call        Pt. took "2 1/2 mg" of VB12, wants to make sure he won't have a reaction and wants to know if he needs to take it next month?   DR.BRODIE PLEASE ADVISE  Follow-up by: Laureen Ochs LPN,  January 30, 2010 10:12 AM  Additional Follow-up for Phone Call Additional follow up Details #1::        no worry! he will not have any side effects. Just get the right syringe next time. Additional Follow-up by: Hart Carwin MD,  January 30, 2010 1:30 PM    Additional Follow-up for Phone Call Additional follow up Details #2::    Above MD orders reviewed with patient. Pt. instructed to call back as needed.  Follow-up by: Laureen Ochs LPN,  January 30, 2010 1:51 PM

## 2010-12-04 NOTE — Medication Information (Signed)
Summary: rov/eac  Anticoagulant Therapy  Managed by: Eda Keys, PharmD Referring MD: Dr Unknown PCP: Lucky Cowboy,  MD Supervising MD: Shirlee Latch MD, Jaquavius Hudler Indication 1: Atrial Fibrillation (ICD-427.31) Indication 2: Patient has 2.5 mg tablets (ICD-2.5) Lab Used: [facility] Site: [facility] INR POC 1.7 INR RANGE 2 - 3  Dietary changes: no    Health status changes: no    Bleeding/hemorrhagic complications: no    Recent/future hospitalizations: no    Any changes in medication regimen? yes       Details: started new medication (amoxicillin 250mg ) for 30 days for prostrate infection, per Dr. Vaughan Basta. Patient started this medication a week ago.  Recent/future dental: no  Any missed doses?: no       Is patient compliant with meds? yes       Allergies: No Known Drug Allergies  Anticoagulation Management History:      The patient is taking warfarin and comes in today for a routine follow up visit.  Positive risk factors for bleeding include an age of 40 years or older.  The bleeding index is 'intermediate risk'.  Positive CHADS2 values include History of HTN and Age > 66 years old.  The start date was 05/23/2000.  Anticoagulation responsible provider: Shirlee Latch MD, Torez Beauregard.  INR POC: 1.7.  Cuvette Lot#: [id number].  Exp: 06/2011.    Anticoagulation Management Assessment/Plan:      The patient's current anticoagulation dose is Warfarin sodium 2.5 mg tabs: Take as directed by coumadin clinic..  The target INR is 2 - 3.  The next INR is due 04/15/2010.  Anticoagulation instructions were given to patient.  Results were reviewed/authorized by Eda Keys, PharmD.  He was notified by Alcus Dad B Pharm.         Prior Anticoagulation Instructions: INR 2.1  Continue taking 1 tablet every day.  Return to clinic in 4 weeks.    Current Anticoagulation Instructions: INR-1.7 Take an extra 1/2 a tablet today. Resume normal schedule. Take 1 tablet daily. Return in 3 weeks.

## 2010-12-04 NOTE — Progress Notes (Signed)
Summary: b12 inj prescription  Medications Added CYANOCOBALAMIN 1000 MCG/ML SOLN (CYANOCOBALAMIN) Inject 1 ml intramuscularly into deltoid once per month. PHARMACY-PLEASE INCLUDE APPROPRIATE SYRINGES       Phone Note Call from Patient Call back at Home Phone [phone]   Call For: Dr Juanda Chance Summary of Call: When can he get another prescription for his B12 injections.  Initial call taken by: Leanor Kail [facility],  April 11, 2010 9:21 AM  Follow-up for Phone Call        Patient took 2-3x the amount of b12 he was supposed to take one month because he did not have the appropriate syringes...looks like he did call us to advise [place] of that. I will send a new prescription for him. Follow-up by: Lamona Curl CMA Duncan Dull),  April 11, 2010 9:35 AM    New/Updated Medications: CYANOCOBALAMIN 1000 MCG/ML SOLN (CYANOCOBALAMIN) Inject 1 ml intramuscularly into deltoid once per month. PHARMACY-PLEASE INCLUDE APPROPRIATE SYRINGES Prescriptions: CYANOCOBALAMIN 1000 MCG/ML SOLN (CYANOCOBALAMIN) Inject 1 ml intramuscularly into deltoid once per month. PHARMACY-PLEASE INCLUDE APPROPRIATE SYRINGES  #5 ml x 0   Entered by:   Lamona Curl CMA (AAMA)   Authorized by:   Hart Carwin MD   Signed by:   Lamona Curl CMA (AAMA) on 04/11/2010   Method used:   Electronically to        [facility]  [phone]* (retail)       [address] [place], [place]  [address]       Ph: [phone] or [phone]       Fax: [phone]   RxID:   [id number]   Appended Document: b12 inj prescription --- 05/02/2010 8:43 AM, Lamona Curl CMA (AAMA) wrote: Does patient need his b12 levels drawn? Last time I see it was done was 03/11/09.....  ---- 05/03/2010 9:47 PM, Hart Carwin MD wrote: yes, please recheck B12 once a year  Appended Document: b12 inj prescription Advised patient's wife that patient needs b12 levels drawn.

## 2010-12-04 NOTE — Medication Information (Signed)
Summary: ccr  Anticoagulant Therapy  Managed by: Weston Brass, PharmD Referring MD: Dr Crissie Figures PCP: Lucky Cowboy,  MD Supervising MD: Myrtis Ser MD, Tinnie Gens Indication 1: Atrial Fibrillation (ICD-427.31) Indication 2: Patient has 2.5 mg tablets (ICD-2.5) Lab Used: [facility] Site: [facility] INR POC 2.3 INR RANGE 2 - 3  Dietary changes: no    Health status changes: no    Bleeding/hemorrhagic complications: no    Recent/future hospitalizations: no    Any changes in medication regimen? no    Recent/future dental: no  Any missed doses?: no       Is patient compliant with meds? yes       Allergies: No Known Drug Allergies  Anticoagulation Management History:      The patient is taking warfarin and comes in today for a routine follow up visit.  Positive risk factors for bleeding include an age of 76 years or older.  The bleeding index is 'intermediate risk'.  Positive CHADS2 values include History of HTN and Age > 23 years old.  The start date was 05/23/2000.  Anticoagulation responsible provider: Myrtis Ser MD, Tinnie Gens.  INR POC: 2.3.  Cuvette Lot#: [id number].  Exp: 06/2011.    Anticoagulation Management Assessment/Plan:      The patient's current anticoagulation dose is Warfarin sodium 2.5 mg tabs: Take as directed by coumadin clinic..  The target INR is 2 - 3.  The next INR is due 05/15/2010.  Anticoagulation instructions were given to patient.  Results were reviewed/authorized by Weston Brass, PharmD.  He was notified by Weston Brass PharmD.         Prior Anticoagulation Instructions: INR-1.7 Take an extra 1/2 a tablet today. Resume normal schedule. Take 1 tablet daily. Return in 3 weeks.  Current Anticoagulation Instructions: INR 2.3  Continue same dose of 1 tablet every day

## 2010-12-04 NOTE — Medication Information (Signed)
Summary: ccr  Anticoagulant Therapy  Managed by: Bethena Midget, RN, BSN Referring MD: Eden Emms MD, Theron Arista PCP: Lucky Cowboy,  MD Supervising MD: Graciela Husbands MD, Viviann Spare Indication 1: Atrial Fibrillation (ICD-427.31) Indication 2: Patient has 2.5 mg tablets (ICD-2.5) Lab Used: [facility] Site: [facility] INR POC 2.1 INR RANGE 2 - 3  Dietary changes: no    Health status changes: no    Bleeding/hemorrhagic complications: no    Recent/future hospitalizations: no    Any changes in medication regimen? no    Recent/future dental: no  Any missed doses?: no       Is patient compliant with meds? yes       Allergies: No Known Drug Allergies  Anticoagulation Management History:      The patient is taking warfarin and comes in today for a routine follow up visit.  Positive risk factors for bleeding include an age of 75 years or older.  The bleeding index is 'intermediate risk'.  Positive CHADS2 values include History of HTN and Age > 75 years old.  The start date was 05/23/2000.  Anticoagulation responsible provider: Graciela Husbands MD, Viviann Spare.  INR POC: 2.1.  Cuvette Lot#: [id number].  Exp: 09/2011.    Anticoagulation Management Assessment/Plan:      The patient's current anticoagulation dose is Warfarin sodium 2.5 mg tabs: Take as directed by coumadin clinic..  The target INR is 2 - 3.  The next INR is due 09/17/2010.  Anticoagulation instructions were given to patient.  Results were reviewed/authorized by Bethena Midget, RN, BSN.  He was notified by Bethena Midget, RN, BSN.         Prior Anticoagulation Instructions: INR 2.2  Continue taking Coumadin 1 tab (2.5 mg) every day. Return to clinic in 4 weeks.   Current Anticoagulation Instructions: INR 2.1 Continue 2.5mg s daily. REcheck in 4 weeks.

## 2010-12-04 NOTE — Medication Information (Signed)
Summary: rov/mwb   Anticoagulant Therapy  Managed by: Weston Brass, PharmD Referring MD: Eden Emms MD, Theron Arista PCP: Lucky Cowboy,  MD Supervising MD: Shirlee Latch MD, Dalton Indication 1: Atrial Fibrillation (ICD-427.31) Indication 2: Patient has 2.5 mg tablets (ICD-2.5) Lab Used: [facility] Site: [facility] INR POC 2.5 INR RANGE 2 - 3  Dietary changes: no    Health status changes: no    Bleeding/hemorrhagic complications: no    Recent/future hospitalizations: no    Any changes in medication regimen? no    Recent/future dental: no  Any missed doses?: no       Is patient compliant with meds? yes       Allergies: No Known Drug Allergies  Anticoagulation Management History:      The patient is taking warfarin and comes in today for a routine follow up visit.  Positive risk factors for bleeding include an age of 75 years or older.  The bleeding index is 'intermediate risk'.  Positive CHADS2 values include History of HTN and Age > 47 years old.  The start date was 05/23/2000.  His last INR was 2.1.  Anticoagulation responsible provider: Shirlee Latch MD, Dalton.  INR POC: 2.5.  Cuvette Lot#: [id number].  Exp: 11/2011.    Anticoagulation Management Assessment/Plan:      The patient's current anticoagulation dose is Warfarin sodium 2.5 mg tabs: Take as directed by coumadin clinic..  The target INR is 2 - 3.  The next INR is due 12/05/2010.  Anticoagulation instructions were given to patient.  Results were reviewed/authorized by Weston Brass, PharmD.  He was notified by Linward Headland, PharmD candidate.         Prior Anticoagulation Instructions: INR 2.1 The patient is to continue with the same dose of coumadin.  This dosage includes:  Take 1 tablet (2.5mg ) every day Recheck INR in 4 weeks  Current Anticoagulation Instructions: INR 2.5 (goal INR: 2-3)  Take 1 tablet everyday.  Recheck in 3 weeks

## 2010-12-05 ENCOUNTER — Encounter: Payer: Self-pay | Admitting: Cardiology

## 2010-12-05 ENCOUNTER — Encounter (INDEPENDENT_AMBULATORY_CARE_PROVIDER_SITE_OTHER): Payer: Medicare Other

## 2010-12-05 ENCOUNTER — Ambulatory Visit: Admit: 2010-12-05 | Payer: Self-pay

## 2010-12-05 DIAGNOSIS — Z7901 Long term (current) use of anticoagulants: Secondary | ICD-10-CM

## 2010-12-05 DIAGNOSIS — I4891 Unspecified atrial fibrillation: Secondary | ICD-10-CM

## 2010-12-10 NOTE — Medication Information (Signed)
Summary: rov/ewj rsv per pt request-mb  Anticoagulant Therapy  Managed by: Windell Hummingbird, RN Referring MD: Eden Emms MD, Theron Arista PCP: Lucky Cowboy,  MD Supervising MD: Jens Som MD, Arlys John Indication 1: Atrial Fibrillation (ICD-427.31) Indication 2: Patient has 2.5 mg tablets (ICD-2.5) Lab Used: [facility] Site: [facility] INR POC 1.9 INR RANGE 2 - 3  Dietary changes: no    Health status changes: no    Bleeding/hemorrhagic complications: no    Recent/future hospitalizations: no    Any changes in medication regimen? no    Recent/future dental: no  Any missed doses?: no       Is patient compliant with meds? yes       Allergies: No Known Drug Allergies  Anticoagulation Management History:      The patient is taking warfarin and comes in today for a routine follow up visit.  Positive risk factors for bleeding include an age of 75 years or older.  The bleeding index is 'intermediate risk'.  Positive CHADS2 values include History of HTN and Age > 65 years old.  The start date was 05/23/2000.  His last INR was 2.1.  Anticoagulation responsible provider: Jens Som MD, Arlys John.  INR POC: 1.9.  Cuvette Lot#: [id number].  Exp: 11/2011.    Anticoagulation Management Assessment/Plan:      The patient's current anticoagulation dose is Warfarin sodium 2.5 mg tabs: Take as directed by coumadin clinic..  The target INR is 2 - 3.  The next INR is due 01/02/2011.  Anticoagulation instructions were given to patient.  Results were reviewed/authorized by Windell Hummingbird, RN.  He was notified by Windell Hummingbird, RN.         Prior Anticoagulation Instructions: INR 2.5 (goal INR: 2-3)  Take 1 tablet everyday.  Recheck in 3 weeks  Current Anticoagulation Instructions: INR 1.9 Take 1 1/2 tablets today, then continue taking 1 tablet every day. Recheck in 4 weeks.

## 2010-12-16 DIAGNOSIS — I4891 Unspecified atrial fibrillation: Secondary | ICD-10-CM

## 2010-12-16 DIAGNOSIS — Z8679 Personal history of other diseases of the circulatory system: Secondary | ICD-10-CM

## 2011-01-05 ENCOUNTER — Encounter (INDEPENDENT_AMBULATORY_CARE_PROVIDER_SITE_OTHER): Payer: Medicare Other

## 2011-01-05 ENCOUNTER — Encounter: Payer: Self-pay | Admitting: Cardiology

## 2011-01-05 DIAGNOSIS — I4891 Unspecified atrial fibrillation: Secondary | ICD-10-CM

## 2011-01-05 DIAGNOSIS — Z7901 Long term (current) use of anticoagulants: Secondary | ICD-10-CM

## 2011-01-05 LAB — CONVERTED CEMR LAB: POC INR: 2.4

## 2011-01-13 NOTE — Medication Information (Signed)
Summary: rov/tm  Anticoagulant Therapy  Managed by: Cloyde Reams, RN, BSN Referring MD: Eden Emms MD, Theron Arista PCP: Lucky Cowboy,  MD Supervising MD: Daleen Squibb MD, Maisie Fus Indication 1: Atrial Fibrillation (ICD-427.31) Indication 2: Patient has 2.5 mg tablets (ICD-2.5) Lab Used: [facility] Site: [facility] INR POC 2.4 INR RANGE 2 - 3  Dietary changes: no    Health status changes: no    Bleeding/hemorrhagic complications: no    Recent/future hospitalizations: no    Any changes in medication regimen? no    Recent/future dental: no  Any missed doses?: no       Is patient compliant with meds? yes       Allergies: No Known Drug Allergies  Anticoagulation Management History:      The patient is taking warfarin and comes in today for a routine follow up visit.  Positive risk factors for bleeding include an age of 75 years or older.  The bleeding index is 'intermediate risk'.  Positive CHADS2 values include History of HTN and Age > 75 years old.  The start date was 05/23/2000.  His last INR was 2.1.  Anticoagulation responsible provider: Daleen Squibb MD, Maisie Fus.  INR POC: 2.4.  Cuvette Lot#: [id number].  Exp: 11/2011.    Anticoagulation Management Assessment/Plan:      The patient's current anticoagulation dose is Warfarin sodium 2.5 mg tabs: Take as directed by coumadin clinic..  The target INR is 2 - 3.  The next INR is due 02/03/2011.  Anticoagulation instructions were given to patient.  Results were reviewed/authorized by Cloyde Reams, RN, BSN.  He was notified by Cloyde Reams RN.         Prior Anticoagulation Instructions: INR 1.9 Take 1 1/2 tablets today, then continue taking 1 tablet every day. Recheck in 4 weeks.  Current Anticoagulation Instructions: INR 2.4  Continue on same dosage 1 tablet daily.  Recheck in 4 weeks.

## 2011-01-27 ENCOUNTER — Ambulatory Visit (INDEPENDENT_AMBULATORY_CARE_PROVIDER_SITE_OTHER): Payer: Medicare Other | Admitting: *Deleted

## 2011-01-27 DIAGNOSIS — Z8679 Personal history of other diseases of the circulatory system: Secondary | ICD-10-CM

## 2011-01-27 DIAGNOSIS — I4891 Unspecified atrial fibrillation: Secondary | ICD-10-CM

## 2011-01-27 LAB — POCT INR: INR: 2.3

## 2011-01-27 NOTE — Patient Instructions (Signed)
INR 2.5  Continue same dose of 1 tablet every day.

## 2011-02-24 ENCOUNTER — Ambulatory Visit (INDEPENDENT_AMBULATORY_CARE_PROVIDER_SITE_OTHER): Payer: Medicare Other | Admitting: *Deleted

## 2011-02-24 DIAGNOSIS — I4891 Unspecified atrial fibrillation: Secondary | ICD-10-CM

## 2011-02-24 DIAGNOSIS — Z8679 Personal history of other diseases of the circulatory system: Secondary | ICD-10-CM

## 2011-03-04 ENCOUNTER — Telehealth: Payer: Self-pay | Admitting: Cardiology

## 2011-03-05 ENCOUNTER — Encounter (INDEPENDENT_AMBULATORY_CARE_PROVIDER_SITE_OTHER): Payer: Medicare Other | Admitting: Cardiology

## 2011-03-05 ENCOUNTER — Other Ambulatory Visit: Payer: Self-pay | Admitting: Cardiology

## 2011-03-05 DIAGNOSIS — R109 Unspecified abdominal pain: Secondary | ICD-10-CM

## 2011-03-05 DIAGNOSIS — I714 Abdominal aortic aneurysm, without rupture: Secondary | ICD-10-CM

## 2011-03-10 ENCOUNTER — Encounter: Payer: Self-pay | Admitting: Internal Medicine

## 2011-03-17 NOTE — Assessment & Plan Note (Signed)
[facility]                            CARDIOLOGY OFFICE NOTE   NAME:Kohls, ISSAM CARLYON                     MRN:          [id number]  DATE:11/10/2007                            DOB:          [birth year]    PRIMARY CARE PHYSICIAN:  Lucky Cowboy, M.D.   Mr. Dyas is 77 years old and has coronary artery disease, status  post multiple percutaneous interventions.  His last cath was in 2000, at  which time he had 90% stenosis in the posterior descending branch of the  right coronary artery which was managed medically.   He also has paroxysmal atrial fibrillation which has been managed with  amiodarone and Coumadin.   He says he has been doing quite well and has had no chest pain,  shortness breath or palpitations.   PAST MEDICAL HISTORY:  Significant for  1. Hypertension.  2. Hyperlipidemia.  3. Low B12.   CURRENT MEDICATIONS:  1. Amiodarone 100 mg daily.  2. Aspirin.,  3. Synthroid.  4. Simvastatin.  5. Quinapril.   PHYSICAL EXAMINATION:  VITAL SIGNS:  Today blood pressure was 140/74 and  pulse 65 and regular.  NECK:  There is no venous distention.  The carotid pulses were full  without bruits.  CHEST:  Clear without rales or rhonchi.  CARDIAC:  Rhythm was regular.  No murmurs or gallops.  ABDOMEN:  Soft, no organomegaly.  EXTREMITIES:  Peripheral pulses were full and there was no peripheral  edema.   Electrocardiogram showed left axis deviation and intraventricular  conduction delay and sinus rhythm.   IMPRESSION:  1. Coronary artery disease status post multiple percutaneous      interventions, now stable on medical therapy.  2. History of paroxysmal atrial fibrillation controlled on amiodarone.  3. Hypertension.  4. Hyperlipidemia.  5. History of B12 deficiency.   RECOMMENDATIONS:  I think Mr. Salguero is doing well.  Will get  laboratory work to check for amiodarone toxicity as well as a lipid  profile.  I will plan to see him  back in follow-up in one year.     Bruce Elvera Lennox Juanda Chance, MD, [facility]  Electronically Signed    BRB/MedQ  DD: 11/10/2007  DT: 11/10/2007  Job #: [id number]   cc:   Lucky Cowboy, M.D.

## 2011-03-17 NOTE — Assessment & Plan Note (Signed)
[facility] HEALTHCARE                            CARDIOLOGY OFFICE NOTE   NAME:Gnau, RIP HAWES                     MRN:          [id number]  DATE:11/06/2008                            DOB:          08-11-1923    PRIMARY CARE PHYSICIAN:  Lucky Cowboy, MD   CLINICAL HISTORY:  Mr. Yardley is 77 years old and returned for  followup management of his coronary heart disease and atrial  fibrillation.  He has had multiple percutaneous interventions and his  last cath in 2000, showed significant disease and small posterior  descending branch of the right coronary artery, which was managed  medically.  He also has atrial fibrillation, which has been managed with  amiodarone and Coumadin.   He says he has been doing quite well and has had no recent chest pain,  shortness of breath, or palpitations.  No chest pain, palpitations, or  exertional shortness of breath.  Says occasionally he gets shortness of  breath at rest, which is very short lived.   PAST MEDICAL HISTORY:  Significant for hypertension, hyperlipidemia, and  low B12.   CURRENT MEDICATIONS:  1. Coumadin.  2. Amiodarone 200 mg one-half tablet daily.  3. Aspirin.  4. Synthroid.  5. Quinapril 80 mg daily.  6. Simvastatin 40 mg daily.   PHYSICAL EXAMINATION:  VITAL SIGNS:  blood pressure is 156/64 and the  pulse 65 and regular.  There was no venous tension.  The carotid pulses  were full without bruits.  CHEST:  Clear without rales or rhonchi.  HEART:  Rhythm was regular.  No murmurs, rubs, or gallops.  ABDOMEN:  Soft without organomegaly.  EXTREMITIES:  Peripheral pulses full with no peripheral edema.   Electrocardiogram showed left axis deviation was otherwise normal.  Rhythm was sinus.   IMPRESSION:  1. Coronary artery disease status post multiple percutaneous      interventions with coronary anatomy as described above, now stable.  2. History of paroxysmal atrial fibrillation, controlled on  amiodarone      and Coumadin.  3. Hypertension, under optimal control.  4. Hyperlipidemia.  5. History of B12 deficiency.   RECOMMENDATIONS:  I think Mr. Bourque was doing well.  His blood  pressure is somewhat elevated today.  He is going to [place] in about a  week for about 5 weeks.  We will plan to have him come in for fasting  lipid profile, fasting lipid liver, CBC, BMP, and TSH next week when he  has his Coumadin check.  This will be prior to amiodarone surveillance  as well.  We will check his blood pressure when he gets back from  [place] since it is persistently elevated, I think we need to add  another medication for blood pressure control, and I probably chose  amlodipine 5 mg.  We will schedule him for followup p.r.n. in a year.     Bruce Elvera Lennox Juanda Chance, MD, [facility]  Electronically Signed    BRB/MedQ  DD: 11/06/2008  DT: 11/07/2008  Job #: [id number]   cc:   Lucky Cowboy, M.D.

## 2011-03-17 NOTE — Assessment & Plan Note (Signed)
[facility]                         GASTROENTEROLOGY OFFICE NOTE   NAME:Adam [name], Adam [name]                     MRN:          [id number]  DATE:07/27/2007                            DOB:          [birth year]    Mr. Dise is an 77 year old gentleman who is here today to recheck  his B12 level. In 2003 when we saw him for evaluation of an abnormal  function test, he was found to have borderline low level B12 at 213. He  at that time was started on B12 1000 mcg monthly which he has continued  until the present time. He ran out of the medications two months ago and  will need refills, but he was told that he needs an appointment. The  reason for his B12 deficiency is not known. He had an abnormal liver  function test. He also has paroxysmal atrial fibrillation, Coumadin  therapy, sinus bradycardia, coronary artery disease status post  angioplasty in 1995. He has hyperlipidemia and hypertension.   PHYSICAL EXAMINATION:  VITAL SIGNS:  Blood pressure 136/52, pulse 64,  weight 160 pounds.  HEENT:  The patient had rhiphoma of the nose and general flushing of the  face.  LUNGS:  Clear to auscultation.  CARDIAC:  Normal S1 and S2.  ABDOMEN:  Soft and nontender.   IMPRESSION:  This is an 77 year old gentleman with history of B12  deficiency of unknown etiology.   PLAN:  Repeat B12 levels. If low, the patient will continue on B12  supplements, and if normal, there is no need to continue. We will notify  the patient with the results.     Hedwig Morton. Juanda Chance, MD  Electronically Signed    DMB/MedQ  DD: 07/27/2007  DT: 07/28/2007  Job #: [id number]   cc:   Lucky Cowboy, M.D.

## 2011-03-20 NOTE — Discharge Summary (Signed)
[place]. [facility]  Patient:    Adam [name], Adam [name]                     MRN: [id number] Adm. Date:  [id number] Disc. Date: [id number] Attending:  Lenoria Farrier Dictator:   Rozell Searing, P.A.                           Discharge Summary  REASON FOR ADMISSION:  Please refer to dictated admission note.  LABORATORY DATA:  CBC normal. Metabolic profile notable for elevated BUN 27 on admission. Otherwise normal. LFTs normal, save for a mildly low albumin 3.3. Cardiac enzymes negative x 1. TSH and T4 normal. INR of 4.7 on admission, 2.8 at discharge.  Admission chest x-ray:  No active disease. Probable vascular prominence in right superior hilar region (as noted on prior study). Follow-up chest x-ray suggested for further evaluation.  Pulmonary function tests:  Normal spirometry limits/mild decrease in ______ .   HOSPITAL COURSE:  Adam [name] was admitted for amiodarone load secondary to recurrent atrial fibrillation on maximal sotalol. As noted, he had undergone recent successful TEE-TCCV this past July. He presented to the office for follow-up. He denied any complaints and was found to be in atrial fibrillation with controlled ventricular response.  On admission, sotalol was placed on hold, and amiodarone was initiated. The patient tolerated the amiodarone with no reported ill effect. He received a total of 2.6 g by time of discharge. On the morning of discharge, the patient was noted to have spontaneously converted to NSR wherein he remained at time of discharge.  The patient is on chronic Coumadin. INR elevated on admission (4.7). Thus, Coumadin was placed on hold during this brief stay. INR 2.8 at discharge, and patient was instructed to resume at 2.5 mg q.d. with early protime follow-up in the [facility].  RECOMMENDATIONS AT DISCHARGE:  Continue outpatient dosing of amiodarone at 400 mg q.d. with subsequent dosing to be determined by  Bruce R. Juanda Chance, M.D.  DISCHARGE MEDICATIONS: 1. Amiodarone 400 mg q.d. 2. Coumadin 2.5 mg q.d. 3. Zocor 10 mg q.d.  INSTRUCTIONS:  I will check follow-up protime Tuesday (November 20) at [facility].  FOLLOW-UP:  The patient is to return to [employer]. Juanda Chance, M.D. as previously scheduled in approximately 2-3 weeks.  DISCHARGE DIAGNOSES: 1. Recurrent paroxysmal atrial fibrillation.    a. Admitted for amiodarone load.    b. Failed sotalol.    c. Status post successful TEE-TCCV, July 2001. 2. Chronic Coumadin. Mildly supratherapeutic INR on admission. 3. Coronary artery disease, stable. 4. Hyperlipidemia. DD:  09/19/00 TD:  09/19/00 Job: [id number]

## 2011-03-20 NOTE — Op Note (Signed)
[name]. [facility]  Patient:    Adam, [name] Visit Number: [id number] MRN: [id number]          Service Type: DSU Location: [facility] Attending Physician:  Ronne Binning Dictated by:   Nicki Reaper, M.D. Proc. Date: 10/21/01 Admit Date:  10/21/2001                             Operative Report  PREOPERATIVE DIAGNOSIS:  Saw injury, right thumb.  POSTOPERATIVE DIAGNOSIS:  Saw injury, right thumb.  OPERATION:  Revision amputation, right thumb.  SURGEON:  Nicki Reaper, M.D.  ASSISTANT:  None.  ANESTHESIA:  Metacarpal block.  HISTORY:  The patient is a 77 year old male who suffered a saw injury to the tip of his left thumb.  DESCRIPTION OF PROCEDURE:  The patient was given a metacarpal block with 1% Xylocaine without epinephrine; 3 cc was used.  A Penrose drain was used for a tourniquet control at the base of the finger.  Betadine was used for prep. The remainder of the distal nail bed was removed.  The fractured fragments of the distal phalanx were removed with a rongeur.  The volar flap was undermined and this was able to be rotated dorsally.  A small incision was made in the lateral aspect to allow more complete closure.  The wound was irrigated, debrided, and closed with interrupted 5-0 chromic sutures.  Sterile compressive dressing and splint were applied.  The patient tolerated the procedure well.  DISPOSITION:  He is discharged home to return to [facility] in one week on Vicodin and Keflex. Dictated by:   Nicki Reaper, M.D. Attending Physician:  Ronne Binning DD:  10/21/01 TD:  10/22/01 Job: [id number]

## 2011-03-20 NOTE — Op Note (Signed)
NAME:  SEVERO, BEBER NO.:  [id number]   MEDICAL RECORD NO.:  [id number]          PATIENT TYPE:  AMB   LOCATION:  CARD                         FACILITY:  [facility]   PHYSICIAN:  Shan Levans, M.D. LHCDATE OF BIRTH:  August 21, 1923   DATE OF PROCEDURE:  08/05/2004  DATE OF DISCHARGE:                                 OPERATIVE REPORT   INDICATION:  Left upper lobe density, evaluate for malignancy.   OPERATOR:  Shan Levans, M.D. [facility]   ANESTHESIA:  1% Xylocaine local.   PREOPERATIVE MEDICATIONS:  1.  Versed 3 mg IV push.  2.  Demerol 30 mg IV push.   PROCEDURE:  The Pentax video bronchoscope was introduced in the right naris.  The upper airways were visualized and were unremarkable.  The entire  tracheobronchial tree was visualized and revealed diffuse tracheobronchitis.   Attention was then paid to the left upper lobe orifice.  No endobronchial  lesions were seen.  Transbronchial biopsies x5 were obtained.  Bronchial  washings were obtained.  A 50 cc hemorrhage ensued.  This was controlled  with topical epinephrine and 10,000 units of topical thrombin.  At the end  of the procedure, the bleeding had stopped.  The patient was stabilized and  transported to the recovery room.   IMPRESSION:  Left upper lobe density, rule out malignancy.   RECOMMENDATIONS:  Follow up pathology/microbiology.     Patr   PW/MEDQ  D:  08/05/2004  T:  08/05/2004  Job:  [id number]

## 2011-03-20 NOTE — Discharge Summary (Signed)
[place]. [facility]  Patient:    Adam [name], Adam [name]                       MRN: [id number] Adm. Date:  05/21/00 Disc. Date: 05/24/00 Attending:  Everardo Beals. Juanda Chance, M.D. [facility] Dictator:   Abelino Derrick, P.A.C. [facility] CC:         Bruce R. Juanda Chance, M.D. [facility]                           Discharge Summary  DISCHARGE DIAGNOSES: 1. Paroxysmal atrial fibrillation, holding sinus rhythm on increased    Betapace at discharge. 2. Coronary disease with a history of right coronary artery angioplasty in    1995, catheterization October 2000, plan for medical therapy. 3. Normal left ventricular function. 4. Treated hyperlipidemia. 5. Controlled hypertension.  PROCEDURES:  TEE-guided cardioversion May 21, 2000.  HOSPITAL COURSE:  The patient is a 77 year old male followed by Dr. Juanda Chance and Dr. Dayton Scrape with a history of coronary disease.  He has had previous RCA intervention in 1995.  Catheterization in October revealed diffuse coronary disease; plan is for medical therapy.  He has PAF and has been holding sinus rhythm for several years.  He has been on Betapace 120 b.i.d.  Coumadin was stopped in 1997 because he had been holding sinus rhythm.  He presented to the office May 18, 2000, with complaints of weakness and palpitations and was found to be in atrial fibrillation.  Dr. Juanda Chance decided to admit him for TEE-guided cardioversion and the follow him on telemetry for a few days on increased sotalol.  The patient was admitted May 21, 2000, for TEE-guided cardioversion.  Coumadin had been started May 18, 2000.  Dr. Tenny Craw performed a TEE-guided cardioversion on him without complications. TEE revealed no left atrial clot.  He had good LV function.  The patient was cardioverted to sinus rhythm.  He was kept on heparin postprocedure.  He did have some bradycardia with heart rate in the 40s, but he tolerated this well, and his Betapace was continued.  His heparin was continued until  July 22.  He continues to hold sinus rhythm, and he will be discharged May 24, 2000.  His INR is 2.3.  His QTC is 454.  He will have a pro time checked Wednesday at 8:45 a.m. and will follow up with Dian Queen August 9 at 11:30 a.m.  DISCHARGE MEDICATIONS: 1. Betapace 160 mg twice a day. 2. Zocor 10 mg a day. 3. Coumadin 5 mg once a day except 2.5 mg Monday, Wednesday, and Friday.  LABORATORY DATA:  INR on admission was 1.9; on July 21, it was 2.9; on July 22, it was 2.8.  White count 7.0, hemoglobin 12.8, hematocrit 36.2, platelets 151.  Sodium 138, potassium 4.6, BUN 22, creatinine 1.1.  TSH is 0.618. Urinalysis unremarkable.  EKG shows normal sinus rhythm, sinus bradycardia.  Chest x-ray shows no acute disease.  DISPOSITION:  Patient discharged in stable condition with followup as noted above. DD:  05/24/00 TD:  05/24/00 Job: [id number]

## 2011-03-20 NOTE — Assessment & Plan Note (Signed)
[facility] HEALTHCARE                            CARDIOLOGY OFFICE NOTE   NAME:Pettway, QUEST TAVENNER                     MRN:          [id number]  DATE:10/21/2006                            DOB:          1923-04-26    Primary care physician, Dr. Marlowe Shores.   CLINICAL HISTORY:  Mr. Uher is 77 years old, and has coronary artery  disease, and has had multiple percutaneous interventions.  His last cath  was in 2000, at which time he had 90% stenosis of the posterior  descending branch, which was treated medically.   He also has paroxysmal atrial fibrillation, is being controlled on  amiodarone.   He says he has been doing quite well, has had no recent chest pain,  shortness of breath, or palpitations.   PAST MEDICAL HISTORY:  Significant for hypertension, hyperlipidemia, and  a low B12.   CURRENT MEDICATIONS:  1. Coumadin.  2. Amiodarone.  3. Aspirin  4. Synthroid.  5. Quinapril.  6. Zocor.   PHYSICAL EXAMINATION:  On examination today, the blood pressure was  150/70.  There was no venous distention.  The carotid pulses were full  without bruits.  CHEST:  Clear without rales or rhonchi.  HEART:  Rhythm was regular, no murmurs or gallops.  ABDOMEN:  Soft without organomegaly.  Peripheral pulses were full and there is no peripheral edema.   An ECG showed left axis deviation.   IMPRESSION:  1. Coronary artery disease, status post prior percutaneous coronary      intervention procedures, now stable.  2. History of paroxysmal atrial fibrillation, controlled with      amiodarone.  3. Hypertension under optimal control.  4. Good left ventricular function.  5. Hyperlipidemia.  6. History of B12 deficiency.   RECOMMENDATIONS:  We will get a BMP on Mr. Zandi today, and if his  creatinine is stable, then we will increase his quinapril from 40 to 80  a day.  His creatinines in the past have been in the 1.4 range.  We will  also get a BMP a week  after starting him on the Accupril.  He is  scheduled to see Dr. Oneta Rack early next year, and we will ask him to  include a TSH, a lipid and liver, as part of his laboratories studies  for amiodarone surveillance.  I plan to see Mr. Gieselman back in  followup in 12 months.     Bruce Elvera Lennox Juanda Chance, MD, [facility]  Electronically Signed    BRB/MedQ  DD: 10/21/2006  DT: 10/21/2006  Job #: [id number]   cc:   Lucky Cowboy, M.D.

## 2011-03-24 ENCOUNTER — Ambulatory Visit (INDEPENDENT_AMBULATORY_CARE_PROVIDER_SITE_OTHER): Payer: Medicare Other | Admitting: *Deleted

## 2011-03-24 DIAGNOSIS — I4891 Unspecified atrial fibrillation: Secondary | ICD-10-CM

## 2011-03-24 DIAGNOSIS — Z8679 Personal history of other diseases of the circulatory system: Secondary | ICD-10-CM

## 2011-03-31 ENCOUNTER — Encounter: Payer: Self-pay | Admitting: Cardiovascular Disease

## 2011-04-01 ENCOUNTER — Telehealth: Payer: Self-pay | Admitting: Cardiovascular Disease

## 2011-04-01 MED ORDER — QUINAPRIL HCL 40 MG PO TABS: ORAL_TABLET | ORAL | Status: DC

## 2011-04-01 NOTE — Telephone Encounter (Signed)
Refill meds quinapril  -[facility] on [address]

## 2011-04-06 ENCOUNTER — Telehealth: Payer: Self-pay | Admitting: *Deleted

## 2011-04-06 NOTE — Telephone Encounter (Signed)
PT AWARE  OF  LAB RESULTS FROM 03/31/11  DR MCKEOWN'S OFFICE./CY

## 2011-04-21 ENCOUNTER — Ambulatory Visit (INDEPENDENT_AMBULATORY_CARE_PROVIDER_SITE_OTHER): Payer: Medicare Other | Admitting: *Deleted

## 2011-04-21 DIAGNOSIS — Z8679 Personal history of other diseases of the circulatory system: Secondary | ICD-10-CM

## 2011-04-21 DIAGNOSIS — I4891 Unspecified atrial fibrillation: Secondary | ICD-10-CM

## 2011-04-21 LAB — POCT INR: INR: 2.2

## 2011-05-19 ENCOUNTER — Ambulatory Visit (INDEPENDENT_AMBULATORY_CARE_PROVIDER_SITE_OTHER): Payer: Medicare Other | Admitting: *Deleted

## 2011-05-19 DIAGNOSIS — Z8679 Personal history of other diseases of the circulatory system: Secondary | ICD-10-CM

## 2011-05-19 DIAGNOSIS — I4891 Unspecified atrial fibrillation: Secondary | ICD-10-CM

## 2011-05-19 LAB — POCT INR: INR: 2.6

## 2011-05-28 ENCOUNTER — Other Ambulatory Visit: Payer: Self-pay | Admitting: Cardiovascular Disease

## 2011-06-09 ENCOUNTER — Encounter: Payer: Medicare Other | Admitting: *Deleted

## 2011-06-10 ENCOUNTER — Ambulatory Visit (INDEPENDENT_AMBULATORY_CARE_PROVIDER_SITE_OTHER): Payer: Medicare Other | Admitting: *Deleted

## 2011-06-10 DIAGNOSIS — Z8679 Personal history of other diseases of the circulatory system: Secondary | ICD-10-CM

## 2011-06-10 DIAGNOSIS — I4891 Unspecified atrial fibrillation: Secondary | ICD-10-CM

## 2011-06-10 LAB — POCT INR: INR: 2.2

## 2011-07-01 ENCOUNTER — Encounter: Payer: Self-pay | Admitting: Cardiovascular Disease

## 2011-07-07 ENCOUNTER — Ambulatory Visit (INDEPENDENT_AMBULATORY_CARE_PROVIDER_SITE_OTHER): Payer: Medicare Other | Admitting: *Deleted

## 2011-07-07 DIAGNOSIS — Z8679 Personal history of other diseases of the circulatory system: Secondary | ICD-10-CM

## 2011-07-07 DIAGNOSIS — I4891 Unspecified atrial fibrillation: Secondary | ICD-10-CM

## 2011-07-20 ENCOUNTER — Other Ambulatory Visit: Payer: Self-pay | Admitting: Internal Medicine

## 2011-08-03 ENCOUNTER — Other Ambulatory Visit: Payer: Self-pay | Admitting: Internal Medicine

## 2011-08-03 DIAGNOSIS — E538 Deficiency of other specified B group vitamins: Secondary | ICD-10-CM

## 2011-08-03 NOTE — Telephone Encounter (Signed)
Advised patient that he needs b12 levels drawn first (we have not checked them since January 2012). Patient verbalizes understanding.

## 2011-08-04 ENCOUNTER — Ambulatory Visit (INDEPENDENT_AMBULATORY_CARE_PROVIDER_SITE_OTHER): Payer: Medicare Other | Admitting: *Deleted

## 2011-08-04 ENCOUNTER — Other Ambulatory Visit (INDEPENDENT_AMBULATORY_CARE_PROVIDER_SITE_OTHER): Payer: Medicare Other

## 2011-08-04 ENCOUNTER — Encounter: Payer: Medicare Other | Admitting: *Deleted

## 2011-08-04 DIAGNOSIS — E538 Deficiency of other specified B group vitamins: Secondary | ICD-10-CM

## 2011-08-04 DIAGNOSIS — I4891 Unspecified atrial fibrillation: Secondary | ICD-10-CM

## 2011-08-04 DIAGNOSIS — Z8679 Personal history of other diseases of the circulatory system: Secondary | ICD-10-CM

## 2011-08-04 LAB — POCT INR: INR: 2.5

## 2011-08-05 ENCOUNTER — Telehealth: Payer: Self-pay | Admitting: *Deleted

## 2011-08-05 DIAGNOSIS — E538 Deficiency of other specified B group vitamins: Secondary | ICD-10-CM

## 2011-08-05 NOTE — Telephone Encounter (Signed)
Labs in EPIC. Note to remind patient. Left a message for patient to call me.

## 2011-08-05 NOTE — Telephone Encounter (Signed)
Message copied by Daphine Deutscher on Wed Aug 05, 2011  8:43 AM ------      Message from: Hart Carwin      Created: Tue Aug 04, 2011 11:09 PM       Please call pt with normal B12 level, no B12 supplements, recheck B12 in  6 months

## 2011-09-01 ENCOUNTER — Ambulatory Visit (INDEPENDENT_AMBULATORY_CARE_PROVIDER_SITE_OTHER): Payer: Medicare Other | Admitting: *Deleted

## 2011-09-01 DIAGNOSIS — Z8679 Personal history of other diseases of the circulatory system: Secondary | ICD-10-CM

## 2011-09-01 DIAGNOSIS — I4891 Unspecified atrial fibrillation: Secondary | ICD-10-CM

## 2011-09-09 ENCOUNTER — Encounter: Payer: Self-pay | Admitting: *Deleted

## 2011-09-09 ENCOUNTER — Emergency Department (HOSPITAL_COMMUNITY)
Admission: EM | Admit: 2011-09-09 | Discharge: 2011-09-10 | Discharge status: Against Medical Advice | Payer: Medicare Other | Attending: Emergency Medicine | Admitting: Emergency Medicine

## 2011-09-09 ENCOUNTER — Other Ambulatory Visit: Payer: Self-pay

## 2011-09-09 DIAGNOSIS — M79609 Pain in unspecified limb: Secondary | ICD-10-CM | POA: Insufficient documentation

## 2011-09-09 DIAGNOSIS — M79602 Pain in left arm: Secondary | ICD-10-CM

## 2011-09-09 HISTORY — DX: Unspecified atrial fibrillation: I48.91

## 2011-09-09 NOTE — ED Notes (Signed)
Labs drawn with IV stick for hold.

## 2011-09-09 NOTE — ED Notes (Signed)
Pt in c/o left arm pain since 7pm tonight, pt denies chest pain or any other symptoms, EKG being obtained in triage

## 2011-09-10 ENCOUNTER — Emergency Department (HOSPITAL_COMMUNITY): Payer: Medicare Other

## 2011-09-10 LAB — CBC
HCT: 33.9 % — ABNORMAL LOW (ref 39.0–52.0)
MCH: 33.4 pg (ref 26.0–34.0)
MCV: 100.3 fL — ABNORMAL HIGH (ref 78.0–100.0)
RBC: 3.38 MIL/uL — ABNORMAL LOW (ref 4.22–5.81)
RDW: 13.2 % (ref 11.5–15.5)
WBC: 8.7 10*3/uL (ref 4.0–10.5)

## 2011-09-10 LAB — CARDIAC PANEL(CRET KIN+CKTOT+MB+TROPI)
Relative Index: 2.3 (ref 0.0–2.5)
Total CK: 241 U/L — ABNORMAL HIGH (ref 7–232)
Troponin I: <0.3 ng/mL (ref <0.30)

## 2011-09-10 LAB — DIFFERENTIAL
Eosinophils Absolute: 0.2 10*3/uL (ref 0.0–0.7)
Eosinophils Relative: 2 % (ref 0–5)
Lymphocytes Relative: 32 % (ref 12–46)
Lymphs Abs: 2.8 10*3/uL (ref 0.7–4.0)
Monocytes Absolute: 1 10*3/uL (ref 0.1–1.0)

## 2011-09-10 LAB — BASIC METABOLIC PANEL
CO2: 24 mEq/L (ref 19–32)
Calcium: 9.2 mg/dL (ref 8.4–10.5)
Creatinine, Ser: 1.65 mg/dL — ABNORMAL HIGH (ref 0.50–1.35)
GFR calc non Af Amer: 36 mL/min — ABNORMAL LOW (ref >90)
Glucose, Bld: 149 mg/dL — ABNORMAL HIGH (ref 70–99)

## 2011-09-10 NOTE — ED Provider Notes (Signed)
History     CSN: [id number] Arrival date & time: 09/09/2011 10:07 PM   First MD Initiated Contact with Patient 09/09/11 2303      Chief Complaint  Patient presents with  . Arm Pain    left    Patient is a 77 y.o. male presenting with arm pain.  Arm Pain   77 year old male presents to emergency with complaint of left arm pain. Patient reports onset of pain tonight around 7 PM after fixing dinner. Pain is diffuse mild starts in mid upper arm and radiates downward. He denies any chest pain shortness of breath nausea diaphoresis or other associated symptoms. Pain worse with moving arm neck and fourth. If he keeps his arm still pain is fine. Patient with significant history of mild coronary disease with reported catheter 5 years ago no prior history stents. Patient with history of atrial fibrillation, on Coumadin and amiodarone, followed by Dr. Ellyn Hack. No prior history of left arm pain. Patient denies any injury to the left arm. Patient played golf today as he does 2-3 times a week.  Past Medical History  Diagnosis Date  . Atrial fibrillation     History reviewed. No pertinent past surgical history.  History reviewed. No pertinent family history.  History  Substance Use Topics  . Smoking status: Never Smoker   . Smokeless tobacco: Not on file  . Alcohol Use: No      Review of Systems  Constitutional: Negative.   HENT: Negative.   Eyes: Negative.   Respiratory: Negative.   Cardiovascular: Negative.   Gastrointestinal: Negative.   Genitourinary: Negative.   Musculoskeletal: Positive for myalgias.  Skin: Negative.   Neurological: Negative.   Hematological: Negative.   Psychiatric/Behavioral: Negative.   All other systems reviewed and are negative.    Allergies  Review of patient's allergies indicates no known allergies.  Home Medications   Current Outpatient Rx  Name Route Sig Dispense Refill  . SYNTHROID PO Oral Take 75 mcg by mouth.     . QUINAPRIL HCL 40 MG PO  TABS  Take 2 tablets once daily 60 tablet 11  . SIMVASTATIN 40 MG PO TABS Oral Take 40 mg by mouth at bedtime.      . WARFARIN SODIUM 2.5 MG PO TABS  TAKE AS DIRECTED BY COUMADIN CLINIC. 90 tablet 1    REFILL    BP 138/76  Pulse 72  Temp(Src) 97.9 F (36.6 C) (Oral)  Resp 20  SpO2 99%  Physical Exam  Nursing note and vitals reviewed. Constitutional: He is oriented to person, place, and time. He appears well-developed and well-nourished.  HENT:  Head: Normocephalic and atraumatic.  Nose: Nose normal.  Mouth/Throat: Oropharynx is clear and moist.  Eyes: Conjunctivae and EOM are normal. Pupils are equal, round, and reactive to light.  Neck: Normal range of motion. Neck supple. No JVD present. No tracheal deviation present. No thyromegaly present.  Cardiovascular: Normal rate, regular rhythm, normal heart sounds and intact distal pulses.  Exam reveals no gallop and no friction rub.   No murmur heard. Pulmonary/Chest: Effort normal and breath sounds normal. No stridor. No respiratory distress. He has no wheezes. He has no rales. He exhibits no tenderness.  Abdominal: Soft. Bowel sounds are normal. He exhibits no distension and no mass. There is no tenderness. There is no rebound and no guarding.  Musculoskeletal: Normal range of motion. He exhibits no edema and no tenderness.       Unable to elicit pain with palpation or  movement of arm  Lymphadenopathy:    He has no cervical adenopathy.  Neurological: He is oriented to person, place, and time. He has normal reflexes. No cranial nerve deficit. He exhibits normal muscle tone. Coordination normal.  Skin: Skin is dry. No rash noted. No erythema. No pallor.  Psychiatric: He has a normal mood and affect. His behavior is normal. Judgment and thought content normal.    ED Course  Procedures (including critical care time)   Labs Reviewed  CARDIAC PANEL(CRET KIN+CKTOT+MB+TROPI)  PROTIME-INR  CBC  DIFFERENTIAL  BASIC METABOLIC PANEL    No results found.   No diagnosis found.   Date: 09/10/2011  Rate: 70  Rhythm: normal sinus rhythm  QRS Axis: normal  Intervals: PR prolonged  ST/T Wave abnormalities: normal  Conduction Disutrbances:first-degree A-V block   Narrative Interpretation: LAFB  Old EKG Reviewed: changes noted, PVC have resolved   MDM  77 year old gentleman with left arm pain without any other symptoms but has history of cardiac disease, atrial fibrillation. Workup essentially unremarkable. Discuss with Dr. Mayford Knife on call for Dr. Eden Emms, who recommends admission by hospitalist for rule out MI. Discuss with the patient who does not wish to be admitted. Patient strongly encouraged to stay, however does not wish admission. Will followup with Dr.Nishnan within the week        Olivia Mackie, MD 09/10/11 [phone]

## 2011-09-10 NOTE — ED Notes (Addendum)
Patient stable resting in room. Patient being discharged home.   Patient stable and being discharged home with family.

## 2011-10-01 ENCOUNTER — Encounter: Payer: Self-pay | Admitting: Cardiovascular Disease

## 2011-10-13 ENCOUNTER — Encounter: Payer: Self-pay | Admitting: *Deleted

## 2011-10-13 ENCOUNTER — Encounter: Payer: Medicare Other | Admitting: *Deleted

## 2011-10-14 ENCOUNTER — Ambulatory Visit (INDEPENDENT_AMBULATORY_CARE_PROVIDER_SITE_OTHER): Payer: Medicare Other | Admitting: *Deleted

## 2011-10-14 ENCOUNTER — Ambulatory Visit (INDEPENDENT_AMBULATORY_CARE_PROVIDER_SITE_OTHER): Payer: Medicare Other | Admitting: Cardiovascular Disease

## 2011-10-14 ENCOUNTER — Encounter: Payer: Self-pay | Admitting: Cardiovascular Disease

## 2011-10-14 DIAGNOSIS — I4891 Unspecified atrial fibrillation: Secondary | ICD-10-CM

## 2011-10-14 DIAGNOSIS — I2510 Atherosclerotic heart disease of native coronary artery without angina pectoris: Secondary | ICD-10-CM

## 2011-10-14 DIAGNOSIS — I10 Essential (primary) hypertension: Secondary | ICD-10-CM

## 2011-10-14 DIAGNOSIS — Z8679 Personal history of other diseases of the circulatory system: Secondary | ICD-10-CM

## 2011-10-14 NOTE — Assessment & Plan Note (Signed)
Recent ER visit 3 weeks ago R/O sounded like muscular left arm pain.

## 2011-10-14 NOTE — Assessment & Plan Note (Signed)
Well controlled.  Continue current medications and low sodium Dash type diet.    

## 2011-10-14 NOTE — Progress Notes (Signed)
History of Present Illness:  The patient is 77 years old and return for management of CAD and paroxysmal atrial fibrillation.  He has had multiple prior PCI procedures and was last catheterized in 2000. he has a history of paroxysmal atrial fibrillation and is on amiodarone and Coumadin.  He has done quite well over the past year and has had no recent chest pain shortness of breath or palpitations.  He does not check his blood pressures at home. His blood pressure was borderline on his last reading here in the office.  His wife had a stroke in September but seems to be recovering   Reviewed ER note from 3 weeks ago Seen for atypical left arm pain Likely muscle strain from golfing   ROS: Denies fever, malais, weight loss, blurry vision, decreased visual acuity, cough, sputum, SOB, hemoptysis, pleuritic pain, palpitaitons, heartburn, abdominal pain, melena, lower extremity edema, claudication, or rash.  All other systems reviewed and negative  General: Affect appropriate Healthy:  appears stated age HEENT: normal Neck supple with no adenopathy JVP normal no bruits no thyromegaly Lungs clear with no wheezing and good diaphragmatic motion Heart:  S1/S2 no murmur,rub, gallop or click PMI normal Abdomen: benighn, BS positve, no tenderness, no AAA no bruit.  No HSM or HJR Distal pulses intact with no bruits No edema Neuro non-focal Skin warm and dry No muscular weakness   Current Outpatient Prescriptions  Medication Sig Dispense Refill  . amiodarone (PACERONE) 200 MG tablet Take 100 mg by mouth daily.        Marland Kitchen amLODipine (NORVASC) 5 MG tablet Take 5 mg by mouth daily.        Marland Kitchen aspirin 81 MG tablet Take 81 mg by mouth daily.        . Levothyroxine Sodium (SYNTHROID PO) Take 75 mcg by mouth.       . quinapril (ACCUPRIL) 40 MG tablet Take 2 tablets once daily  60 tablet  11  . simvastatin (ZOCOR) 40 MG tablet Take 20 mg by mouth at bedtime.       Marland Kitchen warfarin (COUMADIN) 2.5 MG tablet TAKE  AS DIRECTED BY COUMADIN CLINIC.  90 tablet  1    Allergies  Review of patient's allergies indicates no known allergies.  Electrocardiogram:  Assessment and Plan e

## 2011-10-14 NOTE — Assessment & Plan Note (Signed)
Maint NSR continue low dose amiodarone  Coumadin clinic today

## 2011-10-25 ENCOUNTER — Other Ambulatory Visit: Payer: Self-pay | Admitting: Cardiovascular Disease

## 2011-11-24 ENCOUNTER — Ambulatory Visit (INDEPENDENT_AMBULATORY_CARE_PROVIDER_SITE_OTHER): Payer: Medicare Other

## 2011-11-24 DIAGNOSIS — Z8679 Personal history of other diseases of the circulatory system: Secondary | ICD-10-CM

## 2011-11-24 DIAGNOSIS — I4891 Unspecified atrial fibrillation: Secondary | ICD-10-CM

## 2011-11-27 ENCOUNTER — Other Ambulatory Visit: Payer: Self-pay | Admitting: *Deleted

## 2011-11-27 MED ORDER — AMLODIPINE BESYLATE 5 MG PO TABS: 5.0000 mg | ORAL_TABLET | Freq: Every day | ORAL | Status: DC

## 2011-11-27 NOTE — Telephone Encounter (Signed)
Fax Received. Refill Completed. Kasy Iannacone Chowoe (R.M.A)   

## 2012-01-12 ENCOUNTER — Ambulatory Visit (INDEPENDENT_AMBULATORY_CARE_PROVIDER_SITE_OTHER): Payer: Medicare Other | Admitting: Pharmacist

## 2012-01-12 DIAGNOSIS — I4891 Unspecified atrial fibrillation: Secondary | ICD-10-CM

## 2012-01-12 DIAGNOSIS — Z8679 Personal history of other diseases of the circulatory system: Secondary | ICD-10-CM

## 2012-01-15 ENCOUNTER — Encounter: Payer: Self-pay | Admitting: Cardiovascular Disease

## 2012-02-02 ENCOUNTER — Telehealth: Payer: Self-pay | Admitting: *Deleted

## 2012-02-02 ENCOUNTER — Other Ambulatory Visit (INDEPENDENT_AMBULATORY_CARE_PROVIDER_SITE_OTHER): Payer: Medicare Other

## 2012-02-02 DIAGNOSIS — E538 Deficiency of other specified B group vitamins: Secondary | ICD-10-CM

## 2012-02-02 NOTE — Telephone Encounter (Signed)
Message copied by Daphine Deutscher on Tue Feb 02, 2012  9:18 AM ------      Message from: Daphine Deutscher      Created: Thu Aug 06, 2011  8:23 AM       Call and remind pat. B12 level due to be rechecked 02/04/12

## 2012-02-02 NOTE — Telephone Encounter (Signed)
Spoke with patient and he will come for his labs.

## 2012-02-02 NOTE — Telephone Encounter (Signed)
Left a message for patient to call me. 

## 2012-02-04 ENCOUNTER — Telehealth: Payer: Self-pay | Admitting: Internal Medicine

## 2012-02-04 ENCOUNTER — Other Ambulatory Visit: Payer: Self-pay

## 2012-02-04 DIAGNOSIS — E538 Deficiency of other specified B group vitamins: Secondary | ICD-10-CM

## 2012-02-04 MED ORDER — CYANOCOBALAMIN 1000 MCG/ML IJ SOLN: INTRAMUSCULAR | Status: DC

## 2012-02-04 NOTE — Telephone Encounter (Signed)
Patient advised of the results of the B12 and Dr Regino Schultze recommendations.  He wants to give himself his own injections.  I have sent the RX to his pharmacy and I will order labs for 6 months.

## 2012-02-08 ENCOUNTER — Telehealth: Payer: Self-pay | Admitting: Internal Medicine

## 2012-02-08 NOTE — Telephone Encounter (Signed)
Patient wants to know why he got individual vials and not a large multi-dose vial.  I have explained that the multidose vial will be need to be disposed of after 30 days.

## 2012-02-12 ENCOUNTER — Telehealth: Payer: Self-pay | Admitting: Cardiovascular Disease

## 2012-02-12 NOTE — Telephone Encounter (Signed)
02/12/12--LMTCB--NT

## 2012-02-12 NOTE — Telephone Encounter (Signed)
New Problem:     Patient called in needing clarification about his quinapril (ACCUPRIL) 40 MG tablet dose.  Please call back.

## 2012-02-15 NOTE — Telephone Encounter (Signed)
PER PT WAS CHECKING ON DIRECTIONS  FOR  ACCUPRIL PER LAST OFFICE NOTE PT TO TAKE  40 MG  1 TAB TWICE DAILY, THOUGHT WAS CHANGED TO 1 A DAY  NO DOCUMENTATION  TO VERIFY . PT AWARE  TO TAKE BID .Zack Seal

## 2012-02-18 ENCOUNTER — Ambulatory Visit (INDEPENDENT_AMBULATORY_CARE_PROVIDER_SITE_OTHER): Payer: Medicare Other

## 2012-02-18 DIAGNOSIS — I4891 Unspecified atrial fibrillation: Secondary | ICD-10-CM

## 2012-02-18 DIAGNOSIS — Z8679 Personal history of other diseases of the circulatory system: Secondary | ICD-10-CM

## 2012-02-18 LAB — POCT INR: INR: 2.2

## 2012-02-22 ENCOUNTER — Other Ambulatory Visit: Payer: Self-pay

## 2012-02-22 ENCOUNTER — Other Ambulatory Visit: Payer: Self-pay | Admitting: Cardiovascular Disease

## 2012-02-22 MED ORDER — AMLODIPINE BESYLATE 5 MG PO TABS: 5.0000 mg | ORAL_TABLET | Freq: Every day | ORAL | Status: DC

## 2012-02-22 MED ORDER — QUINAPRIL HCL 40 MG PO TABS: ORAL_TABLET | ORAL | Status: DC

## 2012-03-31 ENCOUNTER — Ambulatory Visit (INDEPENDENT_AMBULATORY_CARE_PROVIDER_SITE_OTHER): Payer: Medicare Other

## 2012-03-31 DIAGNOSIS — I4891 Unspecified atrial fibrillation: Secondary | ICD-10-CM

## 2012-03-31 DIAGNOSIS — Z8679 Personal history of other diseases of the circulatory system: Secondary | ICD-10-CM

## 2012-03-31 LAB — POCT INR: INR: 2.1

## 2012-05-12 ENCOUNTER — Ambulatory Visit (INDEPENDENT_AMBULATORY_CARE_PROVIDER_SITE_OTHER): Payer: Medicare Other

## 2012-05-12 DIAGNOSIS — I4891 Unspecified atrial fibrillation: Secondary | ICD-10-CM

## 2012-05-12 DIAGNOSIS — Z8679 Personal history of other diseases of the circulatory system: Secondary | ICD-10-CM

## 2012-05-12 LAB — POCT INR: INR: 2.3

## 2012-05-16 ENCOUNTER — Other Ambulatory Visit: Payer: Self-pay | Admitting: Cardiovascular Disease

## 2012-05-24 ENCOUNTER — Other Ambulatory Visit: Payer: Self-pay | Admitting: Cardiovascular Disease

## 2012-06-13 ENCOUNTER — Other Ambulatory Visit: Payer: Self-pay | Admitting: Cardiovascular Disease

## 2012-06-23 ENCOUNTER — Ambulatory Visit (INDEPENDENT_AMBULATORY_CARE_PROVIDER_SITE_OTHER): Payer: Medicare Other | Admitting: Pharmacist

## 2012-06-23 DIAGNOSIS — Z8679 Personal history of other diseases of the circulatory system: Secondary | ICD-10-CM

## 2012-06-23 DIAGNOSIS — I4891 Unspecified atrial fibrillation: Secondary | ICD-10-CM

## 2012-06-23 LAB — POCT INR: INR: 2.3

## 2012-07-16 ENCOUNTER — Other Ambulatory Visit: Payer: Self-pay | Admitting: Cardiovascular Disease

## 2012-08-04 ENCOUNTER — Ambulatory Visit (INDEPENDENT_AMBULATORY_CARE_PROVIDER_SITE_OTHER): Payer: Medicare Other | Admitting: *Deleted

## 2012-08-04 DIAGNOSIS — Z8679 Personal history of other diseases of the circulatory system: Secondary | ICD-10-CM

## 2012-08-04 DIAGNOSIS — I4891 Unspecified atrial fibrillation: Secondary | ICD-10-CM

## 2012-08-04 LAB — POCT INR: INR: 3.5

## 2012-08-05 ENCOUNTER — Telehealth: Payer: Self-pay | Admitting: *Deleted

## 2012-08-05 NOTE — Telephone Encounter (Signed)
Message copied by Daphine Deutscher on Fri Aug 05, 2012  9:00 AM ------      Message from: Annett Fabian      Created: Thu Feb 04, 2012  3:23 PM       Needs b12 level, please call him I have placed the orders

## 2012-08-05 NOTE — Telephone Encounter (Signed)
Spoke with patient and reminded of labs due. 

## 2012-08-11 ENCOUNTER — Other Ambulatory Visit (INDEPENDENT_AMBULATORY_CARE_PROVIDER_SITE_OTHER): Payer: Medicare Other

## 2012-08-11 DIAGNOSIS — E538 Deficiency of other specified B group vitamins: Secondary | ICD-10-CM

## 2012-08-12 ENCOUNTER — Other Ambulatory Visit: Payer: Self-pay | Admitting: *Deleted

## 2012-08-12 DIAGNOSIS — E538 Deficiency of other specified B group vitamins: Secondary | ICD-10-CM

## 2012-08-25 ENCOUNTER — Ambulatory Visit (INDEPENDENT_AMBULATORY_CARE_PROVIDER_SITE_OTHER): Payer: Medicare Other | Admitting: *Deleted

## 2012-08-25 DIAGNOSIS — I4891 Unspecified atrial fibrillation: Secondary | ICD-10-CM

## 2012-08-25 DIAGNOSIS — Z8679 Personal history of other diseases of the circulatory system: Secondary | ICD-10-CM

## 2012-09-23 ENCOUNTER — Ambulatory Visit (INDEPENDENT_AMBULATORY_CARE_PROVIDER_SITE_OTHER): Payer: Medicare Other | Admitting: *Deleted

## 2012-09-23 DIAGNOSIS — Z8679 Personal history of other diseases of the circulatory system: Secondary | ICD-10-CM

## 2012-09-23 DIAGNOSIS — I4891 Unspecified atrial fibrillation: Secondary | ICD-10-CM

## 2012-10-20 ENCOUNTER — Ambulatory Visit (INDEPENDENT_AMBULATORY_CARE_PROVIDER_SITE_OTHER): Payer: Medicare Other

## 2012-10-20 DIAGNOSIS — Z8679 Personal history of other diseases of the circulatory system: Secondary | ICD-10-CM

## 2012-10-20 DIAGNOSIS — I4891 Unspecified atrial fibrillation: Secondary | ICD-10-CM

## 2012-10-20 LAB — POCT INR: INR: 2

## 2012-10-21 ENCOUNTER — Ambulatory Visit: Payer: Medicare Other | Admitting: Cardiovascular Disease

## 2012-11-04 ENCOUNTER — Other Ambulatory Visit: Payer: Self-pay | Admitting: Surgery

## 2012-11-11 ENCOUNTER — Encounter: Payer: Self-pay | Admitting: Cardiovascular Disease

## 2012-11-11 ENCOUNTER — Ambulatory Visit (INDEPENDENT_AMBULATORY_CARE_PROVIDER_SITE_OTHER): Payer: Medicare Other | Admitting: Cardiovascular Disease

## 2012-11-11 VITALS — BP 129/55 | HR 64 | Wt 162.0 lb

## 2012-11-11 DIAGNOSIS — I2510 Atherosclerotic heart disease of native coronary artery without angina pectoris: Secondary | ICD-10-CM

## 2012-11-11 DIAGNOSIS — I4891 Unspecified atrial fibrillation: Secondary | ICD-10-CM

## 2012-11-11 DIAGNOSIS — I10 Essential (primary) hypertension: Secondary | ICD-10-CM

## 2012-11-11 NOTE — Assessment & Plan Note (Signed)
Maint NSR on very low dose amiodarone Labs followed by Dr Marvel Plan

## 2012-11-11 NOTE — Assessment & Plan Note (Signed)
Stable with no angina and good activity level.  Continue medical Rx  

## 2012-11-11 NOTE — Assessment & Plan Note (Signed)
Well controlled.  Continue current medications and low sodium Dash type diet.    

## 2012-11-11 NOTE — Progress Notes (Signed)
Patient ID: Adam [name], male   DOB: July 22, 1923, 79 y.o.   MRN: [id number] History of Present Illness:  The patient is 79 years old and return for management of CAD and paroxysmal atrial fibrillation.  He has had multiple prior PCI procedures and was last catheterized in 2000. he has a history of paroxysmal atrial fibrillation and is on amiodarone and Coumadin.  He has done quite well over the past year and has had no recent chest pain shortness of breath or palpitations.  He does not check his blood pressures at home. His blood pressure was borderline on his last reading here in the office.  His wife had a stroke in September but seems to be recovering  Reviewed ER note from 3 weeks ago Seen for atypical left arm pain Likely muscle strain from golfing   ROS: Denies fever, malais, weight loss, blurry vision, decreased visual acuity, cough, sputum, SOB, hemoptysis, pleuritic pain, palpitaitons, heartburn, abdominal pain, melena, lower extremity edema, claudication, or rash.  All other systems reviewed and negative  General: Affect appropriate Healthy:  appears stated age HEENT: normal Neck supple with no adenopathy JVP normal no bruits no thyromegaly Lungs clear with no wheezing and good diaphragmatic motion Heart:  S1/S2 no murmur, no rub, gallop or click PMI normal Abdomen: benighn, BS positve, no tenderness, no AAA no bruit.  No HSM or HJR Distal pulses intact with no bruits No edema but some venous stasis and dry skin in LEs Neuro non-focal Skin warm and dry No muscular weakness   Current Outpatient Prescriptions  Medication Sig Dispense Refill  . amiodarone (PACERONE) 200 MG tablet Take 100 mg by mouth daily.        Marland Kitchen amLODipine (NORVASC) 5 MG tablet Take 1 tablet (5 mg total) by mouth daily.  30 tablet  5  . aspirin 81 MG tablet Take 81 mg by mouth daily.        . cyanocobalamin (,VITAMIN B-12,) 1000 MCG/ML injection Inject 1 ml into the muscle once a month.  1 mL  6  .  Levothyroxine Sodium (SYNTHROID PO) Take 75 mcg by mouth.       . quinapril (ACCUPRIL) 40 MG tablet TAKE 2 TABLETS BY MOUTH EVERY DAY  60 tablet  12  . rosuvastatin (CRESTOR) 10 MG tablet Take 5 mg by mouth daily.      Marland Kitchen warfarin (COUMADIN) 2.5 MG tablet TAKE AS DIRECTED BY COUMADIN CLINIC.  90 tablet  1    Allergies  Review of patient's allergies indicates no known allergies.  Electrocardiogram:  NSR rate 64 LAD PR 232   Assessment and Plan

## 2012-11-11 NOTE — Patient Instructions (Signed)
**Note De-identified  Obfuscation** Your physician recommends that you continue on your current medications as directed. Please refer to the Current Medication list given to you today.  Your physician wants you to follow-up in: 1 year. You will receive a reminder letter in the mail two months in advance. If you don't receive a letter, please call our office to schedule the follow-up appointment.  

## 2012-11-18 ENCOUNTER — Other Ambulatory Visit: Payer: Self-pay | Admitting: *Deleted

## 2012-11-18 MED ORDER — WARFARIN SODIUM 2.5 MG PO TABS: 2.5000 mg | ORAL_TABLET | ORAL | Status: DC

## 2012-12-01 ENCOUNTER — Ambulatory Visit (INDEPENDENT_AMBULATORY_CARE_PROVIDER_SITE_OTHER): Payer: Medicare Other

## 2012-12-01 DIAGNOSIS — I4891 Unspecified atrial fibrillation: Secondary | ICD-10-CM

## 2012-12-01 DIAGNOSIS — Z8679 Personal history of other diseases of the circulatory system: Secondary | ICD-10-CM

## 2012-12-01 LAB — POCT INR: INR: 1.9

## 2012-12-12 ENCOUNTER — Other Ambulatory Visit: Payer: Self-pay | Admitting: *Deleted

## 2012-12-12 MED ORDER — AMLODIPINE BESYLATE 5 MG PO TABS: 5.0000 mg | ORAL_TABLET | Freq: Every day | ORAL | Status: DC

## 2012-12-12 NOTE — Telephone Encounter (Signed)
Pt is Dr. Eden Emms patient not Dr. Elease Hashimoto. Fax Received. Refill Completed. Cayci Mcnabb Chowoe (R.M.A)

## 2013-01-12 ENCOUNTER — Ambulatory Visit (INDEPENDENT_AMBULATORY_CARE_PROVIDER_SITE_OTHER): Payer: Medicare Other | Admitting: *Deleted

## 2013-01-12 DIAGNOSIS — Z8679 Personal history of other diseases of the circulatory system: Secondary | ICD-10-CM

## 2013-01-12 DIAGNOSIS — I4891 Unspecified atrial fibrillation: Secondary | ICD-10-CM

## 2013-01-12 LAB — POCT INR: INR: 1.7

## 2013-01-26 ENCOUNTER — Ambulatory Visit (INDEPENDENT_AMBULATORY_CARE_PROVIDER_SITE_OTHER): Payer: Medicare Other

## 2013-01-26 DIAGNOSIS — Z8679 Personal history of other diseases of the circulatory system: Secondary | ICD-10-CM

## 2013-01-26 DIAGNOSIS — I4891 Unspecified atrial fibrillation: Secondary | ICD-10-CM

## 2013-01-26 LAB — POCT INR: INR: 1.9

## 2013-02-09 ENCOUNTER — Telehealth: Payer: Self-pay | Admitting: *Deleted

## 2013-02-09 ENCOUNTER — Other Ambulatory Visit: Payer: Self-pay | Admitting: *Deleted

## 2013-02-09 NOTE — Telephone Encounter (Signed)
Patient not available with try again later.

## 2013-02-09 NOTE — Telephone Encounter (Signed)
Message copied by Daphine Deutscher on Thu Feb 09, 2013  9:49 AM ------      Message from: Daphine Deutscher      Created: Fri Aug 12, 2012  9:58 AM       Call and remind patient due for B12 recheck 02/13/13.DB ------

## 2013-02-10 NOTE — Telephone Encounter (Signed)
Patient notified of lab work due next week. He will come for labs.

## 2013-02-16 ENCOUNTER — Other Ambulatory Visit (INDEPENDENT_AMBULATORY_CARE_PROVIDER_SITE_OTHER): Payer: Medicare Other

## 2013-02-16 DIAGNOSIS — E538 Deficiency of other specified B group vitamins: Secondary | ICD-10-CM

## 2013-02-16 LAB — VITAMIN B12: Vitamin B-12: 290 pg/mL (ref 211–911)

## 2013-02-20 ENCOUNTER — Other Ambulatory Visit: Payer: Self-pay | Admitting: Internal Medicine

## 2013-02-20 DIAGNOSIS — D649 Anemia, unspecified: Secondary | ICD-10-CM

## 2013-02-22 ENCOUNTER — Telehealth: Payer: Self-pay | Admitting: Internal Medicine

## 2013-02-22 NOTE — Telephone Encounter (Signed)
Spoke with patient and told him per results note on 02/16/13, he is to take B12 1000 ug po daily OTC.

## 2013-02-23 ENCOUNTER — Ambulatory Visit (INDEPENDENT_AMBULATORY_CARE_PROVIDER_SITE_OTHER): Payer: Medicare Other | Admitting: Pharmacist

## 2013-02-23 DIAGNOSIS — I4891 Unspecified atrial fibrillation: Secondary | ICD-10-CM

## 2013-02-23 DIAGNOSIS — Z8679 Personal history of other diseases of the circulatory system: Secondary | ICD-10-CM

## 2013-02-28 ENCOUNTER — Telehealth: Payer: Self-pay | Admitting: Internal Medicine

## 2013-02-28 NOTE — Telephone Encounter (Signed)
Patient advised that b12 injectable is on national backorder. However, he can take B12 1000 mcg orally once daily until the b12 becomes available. Patient verbalizes understanding.

## 2013-03-16 ENCOUNTER — Ambulatory Visit (INDEPENDENT_AMBULATORY_CARE_PROVIDER_SITE_OTHER): Payer: Medicare Other

## 2013-03-16 DIAGNOSIS — I4891 Unspecified atrial fibrillation: Secondary | ICD-10-CM

## 2013-03-16 DIAGNOSIS — Z8679 Personal history of other diseases of the circulatory system: Secondary | ICD-10-CM

## 2013-03-16 LAB — POCT INR: INR: 2

## 2013-03-20 ENCOUNTER — Other Ambulatory Visit: Payer: Self-pay | Admitting: Emergency Medicine

## 2013-03-20 DIAGNOSIS — R1012 Left upper quadrant pain: Secondary | ICD-10-CM

## 2013-03-21 ENCOUNTER — Telehealth: Payer: Self-pay | Admitting: Cardiovascular Disease

## 2013-03-21 ENCOUNTER — Other Ambulatory Visit: Payer: Self-pay | Admitting: *Deleted

## 2013-03-21 ENCOUNTER — Ambulatory Visit
Admission: RE | Admit: 2013-03-21 | Discharge: 2013-03-21 | Disposition: A | Discharge status: Home / Self Care | Payer: Medicare Other | Source: Ambulatory Visit | Attending: Emergency Medicine | Admitting: Emergency Medicine

## 2013-03-21 DIAGNOSIS — R1012 Left upper quadrant pain: Secondary | ICD-10-CM

## 2013-03-21 MED ORDER — WARFARIN SODIUM 2.5 MG PO TABS: 2.5000 mg | ORAL_TABLET | ORAL | Status: DC

## 2013-03-21 NOTE — Telephone Encounter (Signed)
New problem   Pt has a question regarding his coumadin

## 2013-03-21 NOTE — Telephone Encounter (Signed)
Patient calling to have his coumadin refilled at [facility] on [address]. He is no longer with [facility] and needs this "as soon as possible" because he is out of coumadin. Let patient know I will forward this message to coumadin. He said thank you.  Micki Riley, CMA

## 2013-03-21 NOTE — Telephone Encounter (Signed)
Larey Seat last week and bruised his back, pt states bruising is extensive and wraps around to abdomen.  Pt had INR checked on 03/16/13 2.0.  Pt states he skipped yesterday's dosage of Coumadin secondary to bruising and has not taken today's dosage yet.  Advised we do not recommend holding Coumadin without an INR check or doctor recommendation after assessment of injury.  Pt had CT scan done this am to assess injury.  Advised pt to continue on Coumadin unless MD specifies otherwise.  Pt verbalized understanding.  If bruising worsens advised pt to go to ED for assessment.  Pt will await results from CT scan.

## 2013-04-04 ENCOUNTER — Encounter: Payer: Self-pay | Admitting: Cardiovascular Disease

## 2013-04-06 ENCOUNTER — Ambulatory Visit (INDEPENDENT_AMBULATORY_CARE_PROVIDER_SITE_OTHER): Payer: Medicare Other

## 2013-04-06 DIAGNOSIS — Z8679 Personal history of other diseases of the circulatory system: Secondary | ICD-10-CM

## 2013-04-06 DIAGNOSIS — I4891 Unspecified atrial fibrillation: Secondary | ICD-10-CM

## 2013-04-06 LAB — POCT INR: INR: 2.2

## 2013-05-04 ENCOUNTER — Ambulatory Visit (INDEPENDENT_AMBULATORY_CARE_PROVIDER_SITE_OTHER): Payer: Medicare Other | Admitting: *Deleted

## 2013-05-04 DIAGNOSIS — I4891 Unspecified atrial fibrillation: Secondary | ICD-10-CM

## 2013-05-04 DIAGNOSIS — Z8679 Personal history of other diseases of the circulatory system: Secondary | ICD-10-CM

## 2013-06-08 ENCOUNTER — Telehealth: Payer: Self-pay

## 2013-06-08 ENCOUNTER — Ambulatory Visit (INDEPENDENT_AMBULATORY_CARE_PROVIDER_SITE_OTHER): Payer: Medicare Other

## 2013-06-08 DIAGNOSIS — Z8679 Personal history of other diseases of the circulatory system: Secondary | ICD-10-CM

## 2013-06-08 DIAGNOSIS — I4891 Unspecified atrial fibrillation: Secondary | ICD-10-CM

## 2013-06-08 NOTE — Telephone Encounter (Signed)
Pt needs refill on Amlodipine 30 day supply to [facility].

## 2013-06-12 ENCOUNTER — Other Ambulatory Visit: Payer: Self-pay | Admitting: *Deleted

## 2013-06-12 MED ORDER — AMLODIPINE BESYLATE 5 MG PO TABS: 5.0000 mg | ORAL_TABLET | Freq: Every day | ORAL | Status: DC

## 2013-06-12 NOTE — Telephone Encounter (Signed)
Patient needs to ask Dr Eden Emms for refills. Dr Juanda Chance is his gastroenterologist. Patient's wife has been advised (patient asked that I speak with his wife as he is unable to hear well)

## 2013-07-20 ENCOUNTER — Ambulatory Visit (INDEPENDENT_AMBULATORY_CARE_PROVIDER_SITE_OTHER): Payer: Medicare Other | Admitting: Pharmacist

## 2013-07-20 DIAGNOSIS — Z8679 Personal history of other diseases of the circulatory system: Secondary | ICD-10-CM

## 2013-07-20 DIAGNOSIS — I4891 Unspecified atrial fibrillation: Secondary | ICD-10-CM

## 2013-07-20 LAB — POCT INR: INR: 2.8

## 2013-08-07 ENCOUNTER — Telehealth: Payer: Self-pay | Admitting: *Deleted

## 2013-08-07 NOTE — Telephone Encounter (Signed)
Left a message for patient to call me. 

## 2013-08-07 NOTE — Telephone Encounter (Signed)
Message copied by Daphine Deutscher on Mon Aug 07, 2013  2:38 PM ------      Message from: HUNT, [place] R      Created: Mon Feb 20, 2013  3:44 PM      Regarding: B12 level       Needs labs in ------

## 2013-08-08 NOTE — Telephone Encounter (Signed)
Left a message for patient to call me. 

## 2013-08-11 ENCOUNTER — Other Ambulatory Visit (INDEPENDENT_AMBULATORY_CARE_PROVIDER_SITE_OTHER): Payer: Medicare Other

## 2013-08-11 ENCOUNTER — Ambulatory Visit (HOSPITAL_COMMUNITY)
Admission: RE | Admit: 2013-08-11 | Discharge: 2013-08-11 | Disposition: A | Discharge status: Home / Self Care | Payer: Medicare Other | Source: Ambulatory Visit | Attending: Internal Medicine | Admitting: Internal Medicine

## 2013-08-11 ENCOUNTER — Other Ambulatory Visit (HOSPITAL_COMMUNITY): Payer: Self-pay | Admitting: Internal Medicine

## 2013-08-11 DIAGNOSIS — R05 Cough: Secondary | ICD-10-CM | POA: Insufficient documentation

## 2013-08-11 DIAGNOSIS — J189 Pneumonia, unspecified organism: Secondary | ICD-10-CM | POA: Insufficient documentation

## 2013-08-11 DIAGNOSIS — D649 Anemia, unspecified: Secondary | ICD-10-CM

## 2013-08-11 DIAGNOSIS — R059 Cough, unspecified: Secondary | ICD-10-CM | POA: Insufficient documentation

## 2013-08-11 LAB — VITAMIN B12: Vitamin B-12: 1124 pg/mL — ABNORMAL HIGH (ref 211–911)

## 2013-08-14 ENCOUNTER — Other Ambulatory Visit: Payer: Self-pay | Admitting: Cardiovascular Disease

## 2013-08-14 ENCOUNTER — Other Ambulatory Visit: Payer: Self-pay

## 2013-08-14 DIAGNOSIS — E538 Deficiency of other specified B group vitamins: Secondary | ICD-10-CM

## 2013-08-14 NOTE — Telephone Encounter (Signed)
Patient aware. See lab result note from 08/11/13 (b12).

## 2013-08-31 ENCOUNTER — Ambulatory Visit (INDEPENDENT_AMBULATORY_CARE_PROVIDER_SITE_OTHER): Payer: Medicare Other | Admitting: General Practice

## 2013-08-31 DIAGNOSIS — Z8679 Personal history of other diseases of the circulatory system: Secondary | ICD-10-CM

## 2013-08-31 DIAGNOSIS — I4891 Unspecified atrial fibrillation: Secondary | ICD-10-CM

## 2013-08-31 LAB — POCT INR: INR: 5.4

## 2013-09-05 ENCOUNTER — Other Ambulatory Visit: Payer: Self-pay | Admitting: Cardiovascular Disease

## 2013-09-12 ENCOUNTER — Ambulatory Visit (INDEPENDENT_AMBULATORY_CARE_PROVIDER_SITE_OTHER): Payer: Medicare Other | Admitting: General Practice

## 2013-09-12 DIAGNOSIS — I4891 Unspecified atrial fibrillation: Secondary | ICD-10-CM

## 2013-09-12 DIAGNOSIS — Z8679 Personal history of other diseases of the circulatory system: Secondary | ICD-10-CM

## 2013-09-12 LAB — POCT INR: INR: 3.3

## 2013-09-26 ENCOUNTER — Encounter (INDEPENDENT_AMBULATORY_CARE_PROVIDER_SITE_OTHER): Payer: Self-pay

## 2013-09-26 ENCOUNTER — Ambulatory Visit (INDEPENDENT_AMBULATORY_CARE_PROVIDER_SITE_OTHER): Payer: Medicare Other | Admitting: General Practice

## 2013-09-26 DIAGNOSIS — Z8679 Personal history of other diseases of the circulatory system: Secondary | ICD-10-CM

## 2013-09-26 DIAGNOSIS — I4891 Unspecified atrial fibrillation: Secondary | ICD-10-CM

## 2013-09-26 LAB — POCT INR: INR: 3.3

## 2013-10-15 ENCOUNTER — Encounter: Payer: Self-pay | Admitting: Internal Medicine

## 2013-10-17 ENCOUNTER — Ambulatory Visit (INDEPENDENT_AMBULATORY_CARE_PROVIDER_SITE_OTHER): Payer: Medicare Other | Admitting: *Deleted

## 2013-10-17 ENCOUNTER — Ambulatory Visit (INDEPENDENT_AMBULATORY_CARE_PROVIDER_SITE_OTHER): Payer: Medicare Other | Admitting: Internal Medicine

## 2013-10-17 ENCOUNTER — Encounter: Payer: Self-pay | Admitting: Internal Medicine

## 2013-10-17 ENCOUNTER — Other Ambulatory Visit: Payer: Self-pay | Admitting: Internal Medicine

## 2013-10-17 VITALS — BP 134/70 | HR 76 | Temp 97.9°F | Resp 18 | Wt 165.8 lb

## 2013-10-17 DIAGNOSIS — E559 Vitamin D deficiency, unspecified: Secondary | ICD-10-CM | POA: Insufficient documentation

## 2013-10-17 DIAGNOSIS — I10 Essential (primary) hypertension: Secondary | ICD-10-CM

## 2013-10-17 DIAGNOSIS — R7309 Other abnormal glucose: Secondary | ICD-10-CM

## 2013-10-17 DIAGNOSIS — Z79899 Other long term (current) drug therapy: Secondary | ICD-10-CM

## 2013-10-17 DIAGNOSIS — E1165 Type 2 diabetes mellitus with hyperglycemia: Secondary | ICD-10-CM | POA: Insufficient documentation

## 2013-10-17 DIAGNOSIS — I4891 Unspecified atrial fibrillation: Secondary | ICD-10-CM

## 2013-10-17 DIAGNOSIS — Z8679 Personal history of other diseases of the circulatory system: Secondary | ICD-10-CM

## 2013-10-17 DIAGNOSIS — E782 Mixed hyperlipidemia: Secondary | ICD-10-CM

## 2013-10-17 LAB — CBC WITH DIFFERENTIAL/PLATELET
Basophils Absolute: 0.1 10*3/uL (ref 0.0–0.1)
Eosinophils Relative: 3 % (ref 0–5)
HCT: 35 % — ABNORMAL LOW (ref 39.0–52.0)
Lymphocytes Relative: 27 % (ref 12–46)
MCHC: 33.1 g/dL (ref 30.0–36.0)
MCV: 100 fL (ref 78.0–100.0)
Monocytes Absolute: 0.9 10*3/uL (ref 0.1–1.0)
Platelets: 232 10*3/uL (ref 150–400)
RDW: 14.1 % (ref 11.5–15.5)
WBC: 7.1 10*3/uL (ref 4.0–10.5)

## 2013-10-17 LAB — BASIC METABOLIC PANEL WITH GFR
BUN: 30 mg/dL — ABNORMAL HIGH (ref 6–23)
Chloride: 106 mEq/L (ref 96–112)
Creat: 1.71 mg/dL — ABNORMAL HIGH (ref 0.50–1.35)
GFR, Est Non African American: 35 mL/min — ABNORMAL LOW
Glucose, Bld: 104 mg/dL — ABNORMAL HIGH (ref 70–99)
Potassium: 5.4 mEq/L — ABNORMAL HIGH (ref 3.5–5.3)

## 2013-10-17 LAB — HEPATIC FUNCTION PANEL
ALT: 16 U/L (ref 0–53)
AST: 17 U/L (ref 0–37)
Albumin: 4.3 g/dL (ref 3.5–5.2)
Alkaline Phosphatase: 58 U/L (ref 39–117)
Bilirubin, Direct: 0.1 mg/dL (ref 0.0–0.3)
Total Protein: 7 g/dL (ref 6.0–8.3)

## 2013-10-17 LAB — HEMOGLOBIN A1C
Hgb A1c MFr Bld: 7 % — ABNORMAL HIGH (ref <5.7)
Mean Plasma Glucose: 154 mg/dL — ABNORMAL HIGH (ref <117)

## 2013-10-17 LAB — TSH: TSH: 3.755 u[IU]/mL (ref 0.350–4.500)

## 2013-10-17 LAB — LIPID PANEL
Cholesterol: 173 mg/dL (ref 0–200)
LDL Cholesterol: 86 mg/dL (ref 0–99)
Total CHOL/HDL Ratio: 3.6 Ratio
Triglycerides: 195 mg/dL — ABNORMAL HIGH (ref <150)

## 2013-10-17 LAB — POCT INR: INR: 2.4

## 2013-10-17 MED ORDER — QUINAPRIL HCL 40 MG PO TABS: 40.0000 mg | ORAL_TABLET | Freq: Every day | ORAL | Status: DC

## 2013-10-17 NOTE — Patient Instructions (Signed)
Continue diet & medications same as discussed.   Further disposition pending lab results.     Hypertension As your heart beats, it forces blood through your arteries. This force is your blood pressure. If the pressure is too high, it is called hypertension (HTN) or high blood pressure. HTN is dangerous because you may have it and not know it. High blood pressure may mean that your heart has to work harder to pump blood. Your arteries may be narrow or stiff. The extra work puts you at risk for heart disease, stroke, and other problems.  Blood pressure consists of two numbers, a higher number over a lower, 110/72, for example. It is stated as "110 over 72." The ideal is below 120 for the top number (systolic) and under 80 for the bottom (diastolic). Write down your blood pressure today. You should pay close attention to your blood pressure if you have certain conditions such as:  Heart failure.  Prior heart attack.  Diabetes  Chronic kidney disease.  Prior stroke.  Multiple risk factors for heart disease. To see if you have HTN, your blood pressure should be measured while you are seated with your arm held at the level of the heart. It should be measured at least twice. A one-time elevated blood pressure reading (especially in the Emergency Department) does not mean that you need treatment. There may be conditions in which the blood pressure is different between your right and left arms. It is important to see your caregiver soon for a recheck. Most people have essential hypertension which means that there is not a specific cause. This type of high blood pressure may be lowered by changing lifestyle factors such as:  Stress.  Smoking.  Lack of exercise.  Excessive weight.  Drug/tobacco/alcohol use.  Eating less salt. Most people do not have symptoms from high blood pressure until it has caused damage to the body. Effective treatment can often prevent, delay or reduce that  damage. TREATMENT  When a cause has been identified, treatment for high blood pressure is directed at the cause. There are a large number of medications to treat HTN. These fall into several categories, and your caregiver will help you select the medicines that are best for you. Medications may have side effects. You should review side effects with your caregiver. If your blood pressure stays high after you have made lifestyle changes or started on medicines,   Your medication(s) may need to be changed.  Other problems may need to be addressed.  Be certain you understand your prescriptions, and know how and when to take your medicine.  Be sure to follow up with your caregiver within the time frame advised (usually within two weeks) to have your blood pressure rechecked and to review your medications.  If you are taking more than one medicine to lower your blood pressure, make sure you know how and at what times they should be taken. Taking two medicines at the same time can result in blood pressure that is too low. SEEK IMMEDIATE MEDICAL CARE IF:  You develop a severe headache, blurred or changing vision, or confusion.  You have unusual weakness or numbness, or a faint feeling.  You have severe chest or abdominal pain, vomiting, or breathing problems. MAKE SURE YOU:   Understand these instructions.  Will watch your condition.  Will get help right away if you are not doing well or get worse. Document Released: 10/19/2005 Document Revised: 01/11/2012 Document Reviewed: 06/08/2008 Exit[employer] Patient Information 2014   [employer]. Cholesterol Cholesterol is a white, waxy, fat-like protein needed by your body in small amounts. The liver makes all the cholesterol you need. It is carried from the liver by the blood through the blood vessels. Deposits (plaque) may build up on blood vessel walls. This makes the arteries narrower and stiffer. Plaque increases the risk for heart attack and  stroke. You cannot feel your cholesterol level even if it is very high. The only way to know is by a blood test to check your lipid (fats) levels. Once you know your cholesterol levels, you should keep a record of the test results. Work with your caregiver to to keep your levels in the desired range. WHAT THE RESULTS MEAN:  Total cholesterol is a rough measure of all the cholesterol in your blood.  LDL is the so-called bad cholesterol. This is the type that deposits cholesterol in the walls of the arteries. You want this level to be low.  HDL is the good cholesterol because it cleans the arteries and carries the LDL away. You want this level to be high.  Triglycerides are fat that the body can either burn for energy or store. High levels are closely linked to heart disease. DESIRED LEVELS:  Total cholesterol below 200.  LDL below 100 for people at risk, below 70 for very high risk.  HDL above 50 is good, above 60 is best.  Triglycerides below 150. HOW TO LOWER YOUR CHOLESTEROL:  Diet.  Choose fish or white meat chicken and turkey, roasted or baked. Limit fatty cuts of red meat, fried foods, and processed meats, such as sausage and lunch meat.  Eat lots of fresh fruits and vegetables. Choose whole grains, beans, pasta, potatoes and cereals.  Use only small amounts of olive, corn or canola oils. Avoid butter, mayonnaise, shortening or palm kernel oils. Avoid foods with trans-fats.  Use skim/nonfat milk and low-fat/nonfat yogurt and cheeses. Avoid whole milk, cream, ice cream, egg yolks and cheeses. Healthy desserts include angel food cake, ginger snaps, animal crackers, hard candy, popsicles, and low-fat/nonfat frozen yogurt. Avoid pastries, cakes, pies and cookies.  Exercise.  A regular program helps decrease LDL and raises HDL.  Helps with weight control.  Do things that increase your activity level like gardening, walking, or taking the stairs.  Medication.  May be  prescribed by your caregiver to help lowering cholesterol and the risk for heart disease.  You may need medicine even if your levels are normal if you have several risk factors. HOME CARE INSTRUCTIONS   Follow your diet and exercise programs as suggested by your caregiver.  Take medications as directed.  Have blood work done when your caregiver feels it is necessary. MAKE SURE YOU:   Understand these instructions.  Will watch your condition.  Will get help right away if you are not doing well or get worse. Document Released: 07/14/2001 Document Revised: 01/11/2012 Document Reviewed: 01/04/2008 Exit[employer] Patient Information 2014 [employer]. Vitamin D Deficiency Vitamin D is an important vitamin that your body needs. Having too little of it in your body is called a deficiency. A very bad deficiency can make your bones soft and can cause a condition called rickets.  Vitamin D is important to your body for different reasons, such as:   It helps your body absorb 2 minerals called calcium and phosphorus.  It helps make your bones healthy.  It may prevent some diseases, such as diabetes and multiple sclerosis.  It helps your muscles and heart.   You can get vitamin D in several ways. It is a natural part of some foods. The vitamin is also added to some dairy products and cereals. Some people take vitamin D supplements. Also, your body makes vitamin D when you are in the sun. It changes the sun's rays into a form of the vitamin that your body can use. CAUSES   Not eating enough foods that contain vitamin D.  Not getting enough sunlight.  Having certain digestive system diseases that make it hard to absorb vitamin D. These diseases include Crohn's disease, chronic pancreatitis, and cystic fibrosis.  Having a surgery in which part of the stomach or small intestine is removed.  Being obese. Fat cells pull vitamin D out of your blood. That means that obese people may not have enough  vitamin D left in their blood and in other body tissues.  Having chronic kidney or liver disease. RISK FACTORS Risk factors are things that make you more likely to develop a vitamin D deficiency. They include:  Being older.  Not being able to get outside very much.  Living in a nursing home.  Having had broken bones.  Having weak or thin bones (osteoporosis).  Having a disease or condition that changes how your body absorbs vitamin D.  Having dark skin.  Some medicines such as seizure medicines or steroids.  Being overweight or obese. SYMPTOMS Mild cases of vitamin D deficiency may not have any symptoms. If you have a very bad case, symptoms may include:  Bone pain.  Muscle pain.  Falling often.  Broken bones caused by a minor injury, due to osteoporosis. DIAGNOSIS A blood test is the best way to tell if you have a vitamin D deficiency. TREATMENT Vitamin D deficiency can be treated in different ways. Treatment for vitamin D deficiency depends on what is causing it. Options include:  Taking vitamin D supplements.  Taking a calcium supplement. Your caregiver will suggest what dose is best for you. HOME CARE INSTRUCTIONS  Take any supplements that your caregiver prescribes. Follow the directions carefully. Take only the suggested amount.  Have your blood tested 2 months after you start taking supplements.  Eat foods that contain vitamin D. Healthy choices include:  Fortified dairy products, cereals, or juices. Fortified means vitamin D has been added to the food. Check the label on the package to be sure.  Fatty fish like salmon or trout.  Eggs.  Oysters.  Do not use a tanning bed.  Keep your weight at a healthy level. Lose weight if you need to.  Keep all follow-up appointments. Your caregiver will need to perform blood tests to make sure your vitamin D deficiency is going away. SEEK MEDICAL CARE IF:  You have any questions about your treatment.  You  continue to have symptoms of vitamin D deficiency.  You have nausea or vomiting.  You are constipated.  You feel confused.  You have severe abdominal or back pain. MAKE SURE YOU:  Understand these instructions.  Will watch your condition.  Will get help right away if you are not doing well or get worse. Document Released: 01/11/2012 Document Revised: 02/13/2013 Document Reviewed: 01/11/2012 [employer] Patient Information 2014 [employer].  

## 2013-10-17 NOTE — Progress Notes (Signed)
Patient ID: Adam [name], male   DOB: September 12, 1923, 79 y.o.   MRN: [id number]   This very nice 79 yo MWM presents for 3 month follow up with Hypertension, Hyperlipidemia, Pre-Diabetes and Vitamin D Deficiency.    BP has been controlled at home. Today's BP is 134/70. In October, patient had become more pre-renal with BUN/Creat rising from 31/1.80 (GFR 33) to 54/2.08 (GFR 28) and he was advised to taper Quinapril 40 mg BID to QD and increase water intake .  He also had his Amlodipine dose cut in half because of low BP's and lightheadedness. He is here in part for follow up recheck of renal parameters. Patient denies any cardiac type chest pain, palpitations, dyspnea/orthopnea/PND, dizziness, claudication, or dependent edema. He continues on warfarin for his Hx/o  pA.fib - monitored at [facility] coumadin clinic. He states his annual F/U with Dr Eden Emms is forthcoming soon.   Hyperlipidemia is controlled with diet & meds. Last Cholesterol was  169, Triglycerides were 186, HDL 42 and LDL 90. Patient denies myalgias or other med SE's.    Also, the patient has history of PreDiabetes/insulin resistance with last A1c of 6.5% and elevated insulin 56 suggesting insulin resistance (A1c was 6.5% in May 2012). Patient denies any symptoms of reactive hypoglycemia, diabetic polys, paresthesias or visual blurring.   Further, Patient has history of Vitamin D Deficiency with last vitamin D of 69 (was 25 in 2009). Patient supplements vitamin D without any suspected side-effects.  Current Outpatient Prescriptions on File Prior to Visit  Medication Sig Dispense Refill  . amiodarone (PACERONE) 200 MG tablet Take 100 mg by mouth daily.        Marland Kitchen amLODipine (NORVASC) 5 MG tablet Take 1 tablet (5 mg total) by mouth daily.  30 tablet  5  . aspirin 81 MG tablet Take 81 mg by mouth daily.        . cyanocobalamin (,VITAMIN B-12,) 1000 MCG/ML injection Inject 1 ml into the muscle once a month.  1 mL  6  . Levothyroxine Sodium  (SYNTHROID PO) Take 75 mcg by mouth.       . quinapril (ACCUPRIL) 40 MG tablet TAKE TWO TABLETS BY MOUTH ONCE DAILY  60 tablet  6  . rosuvastatin (CRESTOR) 10 MG tablet Take 5 mg by mouth daily.      Marland Kitchen warfarin (COUMADIN) 2.5 MG tablet TAKE ONE TABLET BY MOUTH AS DIRECTED  100 tablet  1   No current facility-administered medications on file prior to visit.     No Known Allergies  PMHx:   Past Medical History  Diagnosis Date  . Atrial fibrillation   . SINUS BRADYCARDIA   . HYPERTENSION   . CORONARY ARTERY DISEASE   . VITAMIN B12 DEFICIENCY   . NEPHROLITHIASIS, HX OF    PRE-DIABETES / INSULIN RESISTANCE    HYPOTHYROIDISM   . MURMUR     FHx:    Reviewed / unchanged  SHx:    Reviewed / unchanged  Systems Review: Constitutional: Denies fever, chills, wt changes, headaches, insomnia, fatigue, night sweats, change in appetite. Eyes: Denies redness, blurred vision, diplopia, discharge, itchy, watery eyes.  ENT: Denies discharge, congestion, post nasal drip, epistaxis, sore throat, earache, hearing loss, dental pain, tinnitus, vertigo, sinus pain, snoring.  CV: Denies chest pain, palpitations, irregular heartbeat, syncope, dyspnea, diaphoresis, orthopnea, PND, claudication, edema. Respiratory: denies cough, dyspnea, DOE, pleurisy, hoarseness, laryngitis, wheezing.  Gastrointestinal: Denies dysphagia, odynophagia, heartburn, reflux, water brash, abdominal pain or cramps, nausea, vomiting, bloating,  diarrhea, constipation, hematemesis, melena, hematochezia,  or hemorrhoids. Genitourinary: Denies dysuria, frequency, urgency, nocturia, hesitancy, discharge, hematuria, flank pain. Musculoskeletal: Denies arthralgias, myalgias, stiffness, jt. swelling, pain, limp, strain/sprain.  Skin: Denies pruritus, rash, hives, warts, acne, eczema, change in skin lesion(s). Neuro: No weakness, tremor, incoordination, spasms, paresthesia, or pain. Psychiatric: Denies confusion, memory loss, or sensory  loss. Endo: Denies change in weight, skin, hair change.  Heme/Lymph: No excessive bleeding, bruising, orenlarged lymph nodes.  There were no vitals filed for this visit.  Estimated body mass index is 25.37 kg/(m^2) as calculated from the following:   Height as of 10/14/11: 5\' 7"  (1.702 m).   Weight as of 11/11/12: 162 lb (73.483 kg).  On Exam: Appears well nourished - in no distress. Eyes: PERRLA, EOMs, conjunctiva no swelling or erythema. Sinuses: No frontal/maxillary tenderness ENT/Mouth: EAC's clear, TM's nl w/o erythema, bulging. Nares clear w/o erythema, swelling, exudates. Oropharynx clear without erythema or exudates. Oral hygiene is good. Tongue normal, non obstructing. Hearing intact.  Neck: Supple. Thyroid nl. Car 2+/2+ without bruits, nodes or JVD. Chest: Respirations nl with BS clear & equal w/o rales, rhonchi, wheezing or stridor.  Cor: Heart sounds normal w/ regular rate and rhythm without sig. murmurs, gallops, clicks, or rubs. Peripheral pulses normal and equal  without edema.  Abdomen: Soft & bowel sounds normal. Non-tender w/o guarding, rebound, hernias, masses, or organomegaly.  Lymphatics: Unremarkable.  Musculoskeletal: Full ROM all peripheral extremities, joint stability, 5/5 strength, and normal gait.  Skin: Warm, dry without exposed rashes, lesions, ecchymosis apparent.  Neuro: Cranial nerves intact, reflexes equal bilaterally. Sensory-motor testing grossly intact. Tendon reflexes grossly intact.  Pysch: Alert & oriented x 3. Insight and judgement nl & appropriate. No ideations.  Assessment and Plan:  1. Hypertension - Continue monitor blood pressure at home. Continue diet/meds same.  2. Hyperlipidemia - Continue diet/meds, exercise,& lifestyle modifications. Continue monitor periodic cholesterol/liver & renal functions   3. Pre-diabetes/Insulin Resistance - Continue diet, exercise, lifestyle modifications. Monitor appropriate labs.  4. Vitamin D Deficiency -  Continue supplementation.  5. ASHD/A.Fib  6. 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 Recommended labs to assess and monitor clinical status. Further disposition pending results of labs.

## 2013-10-18 LAB — VITAMIN D 25 HYDROXY (VIT D DEFICIENCY, FRACTURES): Vit D, 25-Hydroxy: 63 ng/mL (ref 30–89)

## 2013-10-18 LAB — INSULIN, FASTING: Insulin fasting, serum: 26 u[IU]/mL (ref 3–28)

## 2013-10-19 LAB — VITAMIN D 25 HYDROXY (VIT D DEFICIENCY, FRACTURES): Vit D, 25-Hydroxy: 69 ng/mL (ref 30–89)

## 2013-11-07 ENCOUNTER — Ambulatory Visit (INDEPENDENT_AMBULATORY_CARE_PROVIDER_SITE_OTHER): Payer: Medicare Other | Admitting: *Deleted

## 2013-11-07 DIAGNOSIS — I4891 Unspecified atrial fibrillation: Secondary | ICD-10-CM

## 2013-11-07 DIAGNOSIS — Z8679 Personal history of other diseases of the circulatory system: Secondary | ICD-10-CM

## 2013-11-07 LAB — POCT INR: INR: 2.1

## 2013-11-24 ENCOUNTER — Ambulatory Visit: Payer: Medicare Other | Admitting: Cardiovascular Disease

## 2013-11-28 ENCOUNTER — Ambulatory Visit (INDEPENDENT_AMBULATORY_CARE_PROVIDER_SITE_OTHER): Payer: Medicare Other | Admitting: Cardiovascular Disease

## 2013-11-28 VITALS — BP 138/54 | HR 68 | Ht 67.0 in | Wt 165.0 lb

## 2013-11-28 DIAGNOSIS — Z9189 Other specified personal risk factors, not elsewhere classified: Principal | ICD-10-CM

## 2013-11-28 DIAGNOSIS — I4891 Unspecified atrial fibrillation: Secondary | ICD-10-CM

## 2013-11-28 DIAGNOSIS — Z79899 Other long term (current) drug therapy: Secondary | ICD-10-CM

## 2013-11-28 NOTE — Assessment & Plan Note (Signed)
No palpitations INR;s ok on coumadin.  NSR in office today  Check PFTls with DLCO on amiodarone  TSH and LFTls normal 12/14

## 2013-11-28 NOTE — Assessment & Plan Note (Signed)
SEM of AV sclerosis no need for echo at this time

## 2013-11-28 NOTE — Assessment & Plan Note (Signed)
Stable with no angina and good activity level.  Continue medical Rx  

## 2013-11-28 NOTE — Assessment & Plan Note (Signed)
Well controlled.  Continue current medications and low sodium Dash type diet.   Quinapril decreased to 40 daily by primary

## 2013-11-28 NOTE — Patient Instructions (Signed)
Your physician has recommended that you have a pulmonary function test. Pulmonary Function Tests are a group of tests that measure how well air moves in and out of your lungs.  Your physician wants you to follow-up in: 1 year with Dr. Eden EmmsNishan. You will receive a reminder letter in the mail two months in advance. If you don't receive a letter, please call our office to schedule the follow-up appointment.

## 2013-11-28 NOTE — Progress Notes (Signed)
Patient ID: Adam [name], male   DOB: 02/19/1923, 80 y.o.   MRN: [id number] The patient is 80 years old and return for management of CAD and paroxysmal atrial fibrillation.  He has had multiple prior PCI procedures and was last catheterized in 2000. he has a history of paroxysmal atrial fibrillation and is on amiodarone and Coumadin.  He has done quite well over the past year and has had no recent chest pain shortness of breath or palpitations.  He does not check his blood pressures at home. His blood pressure was borderline on his last reading here in the office.  His wife had a stroke in September but seems to be recovering   Last ER visit 1/14 Seen for atypical left arm pain Likely muscle strain from golfing  Hepatic and TSH normal 12/14  No recent PFTls       ROS: Denies fever, malais, weight loss, blurry vision, decreased visual acuity, cough, sputum, SOB, hemoptysis, pleuritic pain, palpitaitons, heartburn, abdominal pain, melena, lower extremity edema, claudication, or rash.  All other systems reviewed and negative  General: Affect appropriate Healthy:  appears stated age HEENT: hearing aids  Neck supple with no adenopathy JVP normal no bruits no thyromegaly Lungs clear with no wheezing and good diaphragmatic motion Heart:  S1/S2 SEM  murmur, no rub, gallop or click PMI normal Abdomen: benighn, BS positve, no tenderness, no AAA no bruit.  No HSM or HJR Distal pulses intact with no bruits No edema Neuro non-focal Skin warm and dry No muscular weakness   Current Outpatient Prescriptions  Medication Sig Dispense Refill  . amiodarone (PACERONE) 200 MG tablet Take 100 mg by mouth daily.       Marland Kitchen. amLODipine (NORVASC) 5 MG tablet Take 5 mg by mouth daily. Take 1/2 tablet daily.      Marland Kitchen. aspirin 81 MG tablet Take 81 mg by mouth daily.        . Cholecalciferol (VITAMIN D-3) 1000 UNITS CAPS Take 4,000 Units by mouth daily.      . Levothyroxine Sodium (SYNTHROID PO) Take 75 mcg by  mouth.       . quinapril (ACCUPRIL) 40 MG tablet Take 1 tablet (40 mg total) by mouth daily. For BP, heart and kidneys  90 tablet  99  . rosuvastatin (CRESTOR) 10 MG tablet Take 5 mg by mouth daily.      . vitamin B-12 (CYANOCOBALAMIN) 1000 MCG tablet Take 1,000 mcg by mouth daily.      Marland Kitchen. warfarin (COUMADIN) 2.5 MG tablet TAKE ONE TABLET BY MOUTH AS DIRECTED  100 tablet  1   No current facility-administered medications for this visit.    Allergies  Review of patient's allergies indicates no known allergies.  Electrocardiogram:SR rate 68 PR 216 PAC otherwise normal   Assessment and Plan

## 2013-12-05 ENCOUNTER — Ambulatory Visit (INDEPENDENT_AMBULATORY_CARE_PROVIDER_SITE_OTHER): Payer: Medicare Other

## 2013-12-05 DIAGNOSIS — I4891 Unspecified atrial fibrillation: Secondary | ICD-10-CM

## 2013-12-05 DIAGNOSIS — Z8679 Personal history of other diseases of the circulatory system: Secondary | ICD-10-CM

## 2013-12-05 DIAGNOSIS — Z5181 Encounter for therapeutic drug level monitoring: Secondary | ICD-10-CM

## 2013-12-05 LAB — POCT INR: INR: 2.3

## 2014-01-09 ENCOUNTER — Ambulatory Visit (INDEPENDENT_AMBULATORY_CARE_PROVIDER_SITE_OTHER): Payer: Medicare Other | Admitting: Pharmacist

## 2014-01-09 ENCOUNTER — Ambulatory Visit (INDEPENDENT_AMBULATORY_CARE_PROVIDER_SITE_OTHER): Payer: Medicare Other | Admitting: Internal Medicine

## 2014-01-09 DIAGNOSIS — Z5181 Encounter for therapeutic drug level monitoring: Secondary | ICD-10-CM

## 2014-01-09 DIAGNOSIS — Z8679 Personal history of other diseases of the circulatory system: Secondary | ICD-10-CM

## 2014-01-09 DIAGNOSIS — Z9189 Other specified personal risk factors, not elsewhere classified: Principal | ICD-10-CM

## 2014-01-09 DIAGNOSIS — Z79899 Other long term (current) drug therapy: Secondary | ICD-10-CM

## 2014-01-09 DIAGNOSIS — I4891 Unspecified atrial fibrillation: Secondary | ICD-10-CM

## 2014-01-09 LAB — POCT INR: INR: 2.4

## 2014-01-09 NOTE — Progress Notes (Signed)
PFT done today. 

## 2014-01-10 LAB — PULMONARY FUNCTION TEST
DL/VA % pred: 90 %
DL/VA: 3.96 ml/min/mmHg/L
DLCO UNC: 21.18 ml/min/mmHg
DLCO unc % pred: 74 %
FEF 25-75 Post: 2.5 L/sec
FEF 25-75 Pre: 1.96 L/sec
FEF2575-%CHANGE-POST: 27 %
FEF2575-%PRED-PRE: 152 %
FEF2575-%Pred-Post: 193 %
FEV1-%CHANGE-POST: 4 %
FEV1-%PRED-PRE: 126 %
FEV1-%Pred-Post: 132 %
FEV1-Post: 2.84 L
FEV1-Pre: 2.73 L
FEV1FVC-%Change-Post: 5 %
FEV1FVC-%Pred-Pre: 108 %
FEV6-%CHANGE-POST: 0 %
FEV6-%PRED-POST: 121 %
FEV6-%PRED-PRE: 121 %
FEV6-PRE: 3.54 L
FEV6-Post: 3.52 L
FEV6FVC-%Change-Post: 0 %
FEV6FVC-%PRED-POST: 108 %
FEV6FVC-%PRED-PRE: 108 %
FVC-%Change-Post: 0 %
FVC-%Pred-Post: 112 %
FVC-%Pred-Pre: 113 %
FVC-POST: 3.57 L
FVC-PRE: 3.6 L
POST FEV1/FVC RATIO: 80 %
POST FEV6/FVC RATIO: 99 %
Pre FEV1/FVC ratio: 76 %
Pre FEV6/FVC Ratio: 99 %
RV % pred: 97 %
RV: 2.59 L
TLC % PRED: 101 %
TLC: 6.58 L

## 2014-01-26 ENCOUNTER — Ambulatory Visit (INDEPENDENT_AMBULATORY_CARE_PROVIDER_SITE_OTHER): Payer: Medicare Other | Admitting: Pharmacist

## 2014-01-26 DIAGNOSIS — Z5181 Encounter for therapeutic drug level monitoring: Secondary | ICD-10-CM

## 2014-01-26 DIAGNOSIS — I4891 Unspecified atrial fibrillation: Secondary | ICD-10-CM

## 2014-01-26 DIAGNOSIS — Z8679 Personal history of other diseases of the circulatory system: Secondary | ICD-10-CM

## 2014-01-26 LAB — POCT INR: INR: 2.4

## 2014-02-12 ENCOUNTER — Other Ambulatory Visit (INDEPENDENT_AMBULATORY_CARE_PROVIDER_SITE_OTHER): Payer: Medicare Other

## 2014-02-12 ENCOUNTER — Telehealth: Payer: Self-pay | Admitting: *Deleted

## 2014-02-12 DIAGNOSIS — E538 Deficiency of other specified B group vitamins: Secondary | ICD-10-CM

## 2014-02-12 LAB — VITAMIN B12: Vitamin B-12: >1500 pg/mL — ABNORMAL HIGH (ref 211–911)

## 2014-02-12 NOTE — Telephone Encounter (Signed)
Spoke with Mrs. Goins and she will have patient come for lab.

## 2014-02-12 NOTE — Telephone Encounter (Signed)
Message copied by Daphine DeutscherMILLER, Barbara Keng N on Mon Feb 12, 2014  9:53 AM ------      Message from: Annett FabianJONES, SHERI L      Created: Mon Aug 14, 2013  1:23 PM       Needs B12 level.  Orders are in please call him.  See labs 08/14/13 ------

## 2014-02-13 ENCOUNTER — Other Ambulatory Visit: Payer: Self-pay | Admitting: *Deleted

## 2014-02-13 DIAGNOSIS — E538 Deficiency of other specified B group vitamins: Secondary | ICD-10-CM

## 2014-02-16 ENCOUNTER — Ambulatory Visit (INDEPENDENT_AMBULATORY_CARE_PROVIDER_SITE_OTHER): Payer: Medicare Other | Admitting: *Deleted

## 2014-02-16 DIAGNOSIS — Z8679 Personal history of other diseases of the circulatory system: Secondary | ICD-10-CM

## 2014-02-16 DIAGNOSIS — Z5181 Encounter for therapeutic drug level monitoring: Secondary | ICD-10-CM

## 2014-02-16 DIAGNOSIS — I4891 Unspecified atrial fibrillation: Secondary | ICD-10-CM

## 2014-02-16 LAB — POCT INR: INR: 2.5

## 2014-02-27 ENCOUNTER — Telehealth: Payer: Self-pay | Admitting: *Deleted

## 2014-02-27 NOTE — Telephone Encounter (Signed)
PT'S  WIFE AWARE  PFT  OKAY PER  DR NISHAN./CY

## 2014-03-15 ENCOUNTER — Other Ambulatory Visit: Payer: Self-pay | Admitting: Cardiovascular Disease

## 2014-03-16 ENCOUNTER — Ambulatory Visit (INDEPENDENT_AMBULATORY_CARE_PROVIDER_SITE_OTHER): Payer: Medicare Other

## 2014-03-16 DIAGNOSIS — Z8679 Personal history of other diseases of the circulatory system: Secondary | ICD-10-CM

## 2014-03-16 DIAGNOSIS — Z5181 Encounter for therapeutic drug level monitoring: Secondary | ICD-10-CM

## 2014-03-16 DIAGNOSIS — I4891 Unspecified atrial fibrillation: Secondary | ICD-10-CM

## 2014-03-16 LAB — POCT INR: INR: 2.4

## 2014-04-04 ENCOUNTER — Other Ambulatory Visit: Payer: Self-pay | Admitting: Cardiovascular Disease

## 2014-04-08 NOTE — Patient Instructions (Signed)

## 2014-04-08 NOTE — Progress Notes (Signed)
Patient ID: Adam [name], male   DOB: 1923/01/18, 80 y.o.   MRN: [id number]   Annual Screening Comprehensive Examination  This very nice 80 y.o.MWM presents for complete physical.  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   HTN predates since the 1990's. Patient's BP has been controlled at home.Today's BP: 126/58 mmHg. Patient has ASHD and is post PTCA with Hx/o pAfib on coumadin followed at the [facility] coag clinics & his primary cardiologist is Dr Eden Emms. Patient denies any cardiac symptoms as chest pain, palpitations, shortness of breath, dizziness or ankle swelling.   Patient's hyperlipidemia is controlled with diet and medications. Patient denies myalgias or other medication SE's. Last Lipids in Dec 2014 as below were at goal.   Lab Results  Component Value Date   CHOL 173 10/17/2013   HDL 48 10/17/2013   LDLCALC 86 10/17/2013   TRIG 195* 10/17/2013   CHOLHDL 3.6 10/17/2013    Patient has T2_NIDDM (diet controlled) w/Stage 3 CKD with last A1c of 7.0% in Dec 2014.Patient attempts management with diet. Patient denies reactive hypoglycemic symptoms, visual blurring, diabetic polys, or paresthesias.    Finally, patient has history of Vitamin D Deficiency with last vitamin D 63 in Dec 2014.   Medication Sig  . amiodarone  200 MG tablet Take 100 mg  daily.   Marland Kitchen amLODipine (5 MG tablet Take 1/2 tablet daily.  Marland Kitchen aspirin 81 MG tablet Take 81 mg  daily.    Marland Kitchen VITAMIN D-3 1000 UNITS Take 4,000 Unitsdaily.  . Levothyroxine Sodium  Take 75 mcg daily  . Quinapril 40 MG tablet Take 1 tablet (40 mg total)  daily  . rosuvastatin 10 MG tablet Take 5 mgdaily.  Marland Kitchen warfarin (COUMADIN) 2.5 MG tab TAKE ONE TABLET  AS DIRECTED   No Known Allergies  Past Medical History  Diagnosis Date  . Atrial fibrillation   . SINUS BRADYCARDIA   . HYPERTENSION   . CORONARY ARTERY DISEASE   . VITAMIN B12 DEFICIENCY   .  NEPHROLITHIASIS, HX OF   . MURMUR    Past Surgical History  Procedure Laterality Date  . Angioplasty     Family History  Problem Relation Age of Onset  . Colon polyps    . Heart disease     History   Social History  . Marital Status: Married    Spouse Name: N/A    Number of Children: N/A  . Years of Education: N/A   Occupational History  . Retired Barista   Social History Main Topics  . Smoking status: Never Smoker   . Smokeless tobacco: Not on file  . Alcohol Use: No  . Drug Use: No  . Sexual Activity:     ROS Constitutional: Denies fever, chills, weight loss/gain, headaches, insomnia, fatigue, night sweats or change in appetite. Eyes: Denies redness, blurred vision, diplopia, discharge, itchy or watery eyes.  ENT: Denies discharge, congestion, post nasal drip, epistaxis, sore throat, earache, hearing loss, dental pain, Tinnitus, Vertigo, Sinus pain or snoring.  Cardio: Denies chest pain, palpitations, irregular heartbeat, syncope, dyspnea, diaphoresis, orthopnea, PND, claudication or edema Respiratory: denies cough, dyspnea, DOE, pleurisy, hoarseness, laryngitis or wheezing.  Gastrointestinal: Denies dysphagia, heartburn, reflux, water brash, pain, cramps, nausea, vomiting, bloating, diarrhea, constipation, hematemesis, melena, hematochezia, jaundice or hemorrhoids Genitourinary: Denies dysuria, frequency, urgency, nocturia, hesitancy, discharge, hematuria or flank pain Musculoskeletal: Denies arthralgia, myalgia, stiffness, Jt. Swelling, pain, limp or strain/sprain. Skin: Denies  puritis, rash, hives, warts, acne, eczema or change in skin lesion Neuro: No weakness, tremor, incoordination, spasms, paresthesia or pain Psychiatric: Denies confusion, memory loss or sensory loss Endocrine: Denies change in weight, skin, hair change, nocturia, and paresthesia, diabetic polys, visual blurring or hyper / hypo glycemic episodes.  Heme/Lymph: No excessive bleeding, bruising or  enlarged lymph nodes.  Physical Exam  BP 126/58  Pulse 64  Temp 98.1 F   Resp 16  Ht 5' 7.75"   Wt 158 lb 9.6 oz   BMI 24.29 kg/m2  General Appearance: Well nourished, in no apparent distress. Eyes: PERRLA, EOMs, conjunctiva no swelling or erythema, normal fundi and vessels. Sinuses: No frontal/maxillary tenderness ENT/Mouth: EACs patent / TMs  nl. Nares clear without erythema, swelling, mucoid exudates. Oral hygiene is good. No erythema, swelling, or exudate. Tongue normal, non-obstructing. Tonsils not swollen or erythematous. Hearing normal.  Neck: Supple, thyroid normal. No bruits, nodes or JVD. Respiratory: Respiratory effort normal.  BS equal and clear bilateral without rales, rhonci, wheezing or stridor. Cardio: Heart sounds are normal with regular rate and rhythm and no murmurs, rubs or gallops. Peripheral pulses are normal and equal bilaterally without edema. No aortic or femoral bruits. Chest: symmetric with normal excursions and percussion.  Abdomen: Flat, soft, with bowl sounds. Nontender, no guarding, rebound, hernias, masses, or organomegaly.  Lymphatics: Non tender without lymphadenopathy.  Genitourinary: No hernias.Testes nl. DRE - prostate nl for age - smooth & firm w/o nodules. Musculoskeletal: Full ROM all peripheral extremities, joint stability, 5/5 strength, and normal gait. Skin: Warm and dry without rashes, lesions, cyanosis, clubbing or  ecchymosis.  Neuro: Cranial nerves intact, reflexes equal bilaterally. Normal muscle tone, no cerebellar symptoms. Sensation intact.  Pysch: Awake and oriented X 3, normal affect, insight and judgment appropriate.   Assessment and Plan  1. Annual Screening Examination 2. Hypertension  3. Hyperlipidemia 4. ASCAD/pAfib/ PTCA 5. T2 NIDDM w/CKD Stage 3 (GFR 2335ml/min) 6. Vitamin D Deficiency  Continue prudent diet as discussed, weight control, BP monitoring, regular exercise, and medications as discussed.  Discussed med effects  and SE's. Routine screening labs and tests as requested with regular follow-up as recommended.

## 2014-04-10 ENCOUNTER — Encounter: Payer: Self-pay | Admitting: Internal Medicine

## 2014-04-10 ENCOUNTER — Ambulatory Visit (INDEPENDENT_AMBULATORY_CARE_PROVIDER_SITE_OTHER): Payer: Medicare Other | Admitting: Internal Medicine

## 2014-04-10 VITALS — BP 126/58 | HR 64 | Temp 98.1°F | Resp 16 | Ht 67.75 in | Wt 158.6 lb

## 2014-04-10 DIAGNOSIS — E559 Vitamin D deficiency, unspecified: Secondary | ICD-10-CM

## 2014-04-10 DIAGNOSIS — Z1212 Encounter for screening for malignant neoplasm of rectum: Secondary | ICD-10-CM

## 2014-04-10 DIAGNOSIS — I10 Essential (primary) hypertension: Secondary | ICD-10-CM

## 2014-04-10 DIAGNOSIS — E782 Mixed hyperlipidemia: Secondary | ICD-10-CM

## 2014-04-10 DIAGNOSIS — Z0000 Encounter for general adult medical examination without abnormal findings: Secondary | ICD-10-CM

## 2014-04-10 DIAGNOSIS — E538 Deficiency of other specified B group vitamins: Secondary | ICD-10-CM

## 2014-04-10 DIAGNOSIS — E1129 Type 2 diabetes mellitus with other diabetic kidney complication: Secondary | ICD-10-CM

## 2014-04-10 DIAGNOSIS — Z789 Other specified health status: Secondary | ICD-10-CM

## 2014-04-10 DIAGNOSIS — Z125 Encounter for screening for malignant neoplasm of prostate: Secondary | ICD-10-CM

## 2014-04-10 DIAGNOSIS — Z5181 Encounter for therapeutic drug level monitoring: Secondary | ICD-10-CM

## 2014-04-10 DIAGNOSIS — Z1331 Encounter for screening for depression: Secondary | ICD-10-CM

## 2014-04-10 LAB — CBC WITH DIFFERENTIAL/PLATELET
Basophils Absolute: 0.1 10*3/uL (ref 0.0–0.1)
Basophils Relative: 1 % (ref 0–1)
EOS ABS: 0.1 10*3/uL (ref 0.0–0.7)
EOS PCT: 2 % (ref 0–5)
HEMATOCRIT: 32.8 % — AB (ref 39.0–52.0)
Hemoglobin: 11.2 g/dL — ABNORMAL LOW (ref 13.0–17.0)
LYMPHS PCT: 26 % (ref 12–46)
Lymphs Abs: 1.7 10*3/uL (ref 0.7–4.0)
MCH: 33 pg (ref 26.0–34.0)
MCHC: 34.1 g/dL (ref 30.0–36.0)
MCV: 96.8 fL (ref 78.0–100.0)
MONO ABS: 0.7 10*3/uL (ref 0.1–1.0)
Monocytes Relative: 10 % (ref 3–12)
Neutro Abs: 4 10*3/uL (ref 1.7–7.7)
Neutrophils Relative %: 61 % (ref 43–77)
PLATELETS: 245 10*3/uL (ref 150–400)
RBC: 3.39 MIL/uL — ABNORMAL LOW (ref 4.22–5.81)
RDW: 13.8 % (ref 11.5–15.5)
WBC: 6.5 10*3/uL (ref 4.0–10.5)

## 2014-04-10 LAB — HEMOGLOBIN A1C
HEMOGLOBIN A1C: 6.6 % — AB (ref <5.7)
MEAN PLASMA GLUCOSE: 143 mg/dL — AB (ref <117)

## 2014-04-11 LAB — VITAMIN D 25 HYDROXY (VIT D DEFICIENCY, FRACTURES): Vit D, 25-Hydroxy: 75 ng/mL (ref 30–89)

## 2014-04-11 LAB — HEPATIC FUNCTION PANEL
ALT: 16 U/L (ref 0–53)
AST: 18 U/L (ref 0–37)
Albumin: 4.3 g/dL (ref 3.5–5.2)
Alkaline Phosphatase: 59 U/L (ref 39–117)
Bilirubin, Direct: 0.1 mg/dL (ref 0.0–0.3)
Indirect Bilirubin: 0.4 mg/dL (ref 0.2–1.2)
Total Bilirubin: 0.5 mg/dL (ref 0.2–1.2)
Total Protein: 6.8 g/dL (ref 6.0–8.3)

## 2014-04-11 LAB — URINALYSIS, MICROSCOPIC ONLY
BACTERIA UA: NONE SEEN
Crystals: NONE SEEN
SQUAMOUS EPITHELIAL / LPF: NONE SEEN

## 2014-04-11 LAB — LIPID PANEL
Cholesterol: 151 mg/dL (ref 0–200)
HDL: 44 mg/dL (ref >39)
LDL CALC: 78 mg/dL (ref 0–99)
Total CHOL/HDL Ratio: 3.4 Ratio
Triglycerides: 144 mg/dL (ref <150)
VLDL: 29 mg/dL (ref 0–40)

## 2014-04-11 LAB — BASIC METABOLIC PANEL WITHOUT GFR
BUN: 36 mg/dL — ABNORMAL HIGH (ref 6–23)
CO2: 24 meq/L (ref 19–32)
Calcium: 9.1 mg/dL (ref 8.4–10.5)
Chloride: 104 meq/L (ref 96–112)
Creat: 1.69 mg/dL — ABNORMAL HIGH (ref 0.50–1.35)
GFR, Est African American: 41 mL/min — ABNORMAL LOW
GFR, Est Non African American: 35 mL/min — ABNORMAL LOW
Glucose, Bld: 94 mg/dL (ref 70–99)
Potassium: 4.6 meq/L (ref 3.5–5.3)
Sodium: 138 meq/L (ref 135–145)

## 2014-04-11 LAB — MICROALBUMIN / CREATININE URINE RATIO
Creatinine, Urine: 182.2 mg/dL
Microalb Creat Ratio: 18.1 mg/g (ref 0.0–30.0)
Microalb, Ur: 3.29 mg/dL — ABNORMAL HIGH (ref 0.00–1.89)

## 2014-04-11 LAB — TSH: TSH: 0.65 u[IU]/mL (ref 0.350–4.500)

## 2014-04-11 LAB — PSA: PSA: 3.35 ng/mL (ref <=4.00)

## 2014-04-11 LAB — INSULIN, FASTING: INSULIN FASTING, SERUM: 45 u[IU]/mL — AB (ref 3–28)

## 2014-04-11 LAB — MAGNESIUM: Magnesium: 2 mg/dL (ref 1.5–2.5)

## 2014-04-11 LAB — VITAMIN B12: Vitamin B-12: 663 pg/mL (ref 211–911)

## 2014-04-12 ENCOUNTER — Ambulatory Visit (INDEPENDENT_AMBULATORY_CARE_PROVIDER_SITE_OTHER): Payer: Medicare Other

## 2014-04-12 DIAGNOSIS — Z5181 Encounter for therapeutic drug level monitoring: Secondary | ICD-10-CM

## 2014-04-12 DIAGNOSIS — I4891 Unspecified atrial fibrillation: Secondary | ICD-10-CM

## 2014-04-12 DIAGNOSIS — Z8679 Personal history of other diseases of the circulatory system: Secondary | ICD-10-CM

## 2014-04-12 LAB — POCT INR: INR: 2.5

## 2014-04-17 ENCOUNTER — Other Ambulatory Visit: Payer: Self-pay | Admitting: Emergency Medicine

## 2014-04-17 ENCOUNTER — Encounter: Payer: Self-pay | Admitting: Emergency Medicine

## 2014-04-17 ENCOUNTER — Ambulatory Visit (INDEPENDENT_AMBULATORY_CARE_PROVIDER_SITE_OTHER): Payer: Medicare Other | Admitting: Emergency Medicine

## 2014-04-17 VITALS — BP 122/56 | HR 52 | Resp 16 | Ht 67.75 in

## 2014-04-17 DIAGNOSIS — R6889 Other general symptoms and signs: Secondary | ICD-10-CM

## 2014-04-17 DIAGNOSIS — R222 Localized swelling, mass and lump, trunk: Secondary | ICD-10-CM

## 2014-04-17 LAB — CBC WITH DIFFERENTIAL/PLATELET
Basophils Absolute: 0.1 10*3/uL (ref 0.0–0.1)
Basophils Relative: 1 % (ref 0–1)
EOS ABS: 0.1 10*3/uL (ref 0.0–0.7)
Eosinophils Relative: 1 % (ref 0–5)
HCT: 31.2 % — ABNORMAL LOW (ref 39.0–52.0)
HEMOGLOBIN: 10.7 g/dL — AB (ref 13.0–17.0)
Lymphocytes Relative: 25 % (ref 12–46)
Lymphs Abs: 1.6 10*3/uL (ref 0.7–4.0)
MCH: 33.5 pg (ref 26.0–34.0)
MCHC: 34.3 g/dL (ref 30.0–36.0)
MCV: 97.8 fL (ref 78.0–100.0)
MONOS PCT: 11 % (ref 3–12)
Monocytes Absolute: 0.7 10*3/uL (ref 0.1–1.0)
Neutro Abs: 4 10*3/uL (ref 1.7–7.7)
Neutrophils Relative %: 62 % (ref 43–77)
Platelets: 220 10*3/uL (ref 150–400)
RBC: 3.19 MIL/uL — ABNORMAL LOW (ref 4.22–5.81)
RDW: 13.8 % (ref 11.5–15.5)
WBC: 6.4 10*3/uL (ref 4.0–10.5)

## 2014-04-17 LAB — BASIC METABOLIC PANEL WITH GFR
BUN: 43 mg/dL — ABNORMAL HIGH (ref 6–23)
CALCIUM: 9 mg/dL (ref 8.4–10.5)
CO2: 24 mEq/L (ref 19–32)
Chloride: 106 mEq/L (ref 96–112)
Creat: 2.02 mg/dL — ABNORMAL HIGH (ref 0.50–1.35)
GFR, Est African American: 33 mL/min — ABNORMAL LOW
GFR, Est Non African American: 29 mL/min — ABNORMAL LOW
GLUCOSE: 106 mg/dL — AB (ref 70–99)
POTASSIUM: 4.8 meq/L (ref 3.5–5.3)
Sodium: 140 mEq/L (ref 135–145)

## 2014-04-17 NOTE — Progress Notes (Signed)
   Subjective:    Patient ID: Adam [name], male    DOB: 10/19/1923, 80 y.o.   MRN: [id number]  HPI Comments: 80 yo WM with concerns about lump that has reoccurred in right chest/ breast area. He notes last noticed over a week ago and he thinks area resolved. He noted today increased bruising at the site and increased size from previous. He denies any cancer hx for himself but notes son passed with leukemia early in life. He notes mild fatigue but denies any other changes.     Review of Systems  Constitutional: Positive for fatigue.  Respiratory:       Mass right upper  Hematological: Bruises/bleeds easily.  All other systems reviewed and are negative.  BP 122/56  Pulse 52  Resp 16  Ht 5' 7.75" (1.721 m)     Objective:   Physical Exam  Nursing note and vitals reviewed. Constitutional: He is oriented to person, place, and time. He appears well-developed and well-nourished.  HENT:  Head: Normocephalic and atraumatic.  Right Ear: External ear normal.  Left Ear: External ear normal.  Nose: Nose normal.  Mouth/Throat: Oropharynx is clear and moist. No oropharyngeal exudate.  Eyes: Conjunctivae are normal.  Neck: Normal range of motion. Neck supple. No thyromegaly present.  Cardiovascular: Normal rate, regular rhythm, normal heart sounds and intact distal pulses.   Pulmonary/Chest: Effort normal and breath sounds normal.    Mass is fixed and firm with mild tenderness, no obvious axilla lymph nodes  Abdominal: Soft.  Musculoskeletal: Normal range of motion.  Lymphadenopathy:    He has no cervical adenopathy.  Neurological: He is alert and oriented to person, place, and time.  Skin: Skin is warm and dry.  See chest  Psychiatric: He has a normal mood and affect. Judgment normal.          Assessment & Plan:  ? Chest/ breast/ axilla mass vs hematoma- get u/s to evaluate, w/c if symptoms increase, check labs

## 2014-04-18 ENCOUNTER — Other Ambulatory Visit: Payer: Self-pay | Admitting: Emergency Medicine

## 2014-04-18 DIAGNOSIS — R222 Localized swelling, mass and lump, trunk: Secondary | ICD-10-CM

## 2014-04-18 DIAGNOSIS — R6889 Other general symptoms and signs: Secondary | ICD-10-CM

## 2014-04-18 LAB — IRON AND TIBC
%SAT: 31 % (ref 20–55)
Iron: 87 ug/dL (ref 42–165)
TIBC: 278 ug/dL (ref 215–435)
UIBC: 191 ug/dL (ref 125–400)

## 2014-04-18 LAB — VITAMIN B12: VITAMIN B 12: 613 pg/mL (ref 211–911)

## 2014-04-24 ENCOUNTER — Ambulatory Visit
Admission: RE | Admit: 2014-04-24 | Discharge: 2014-04-24 | Disposition: A | Discharge status: Home / Self Care | Payer: Medicare Other | Source: Ambulatory Visit | Attending: Emergency Medicine | Admitting: Emergency Medicine

## 2014-04-24 ENCOUNTER — Other Ambulatory Visit: Payer: Self-pay | Admitting: Emergency Medicine

## 2014-04-24 ENCOUNTER — Ambulatory Visit (INDEPENDENT_AMBULATORY_CARE_PROVIDER_SITE_OTHER): Payer: Medicare Other

## 2014-04-24 DIAGNOSIS — R6889 Other general symptoms and signs: Secondary | ICD-10-CM

## 2014-04-24 DIAGNOSIS — R222 Localized swelling, mass and lump, trunk: Secondary | ICD-10-CM

## 2014-04-24 DIAGNOSIS — N63 Unspecified lump in unspecified breast: Secondary | ICD-10-CM

## 2014-04-24 LAB — BASIC METABOLIC PANEL WITH GFR
BUN: 36 mg/dL — ABNORMAL HIGH (ref 6–23)
CHLORIDE: 105 meq/L (ref 96–112)
CO2: 24 mEq/L (ref 19–32)
Calcium: 8.9 mg/dL (ref 8.4–10.5)
Creat: 1.79 mg/dL — ABNORMAL HIGH (ref 0.50–1.35)
GFR, Est African American: 38 mL/min — ABNORMAL LOW
GFR, Est Non African American: 33 mL/min — ABNORMAL LOW
Glucose, Bld: 139 mg/dL — ABNORMAL HIGH (ref 70–99)
POTASSIUM: 5.1 meq/L (ref 3.5–5.3)
SODIUM: 137 meq/L (ref 135–145)

## 2014-04-24 LAB — CBC WITH DIFFERENTIAL/PLATELET
Basophils Absolute: 0.1 10*3/uL (ref 0.0–0.1)
Basophils Relative: 1 % (ref 0–1)
Eosinophils Absolute: 0.1 10*3/uL (ref 0.0–0.7)
Eosinophils Relative: 1 % (ref 0–5)
HEMATOCRIT: 30.6 % — AB (ref 39.0–52.0)
HEMOGLOBIN: 10.5 g/dL — AB (ref 13.0–17.0)
LYMPHS ABS: 1.5 10*3/uL (ref 0.7–4.0)
LYMPHS PCT: 25 % (ref 12–46)
MCH: 33.5 pg (ref 26.0–34.0)
MCHC: 34.3 g/dL (ref 30.0–36.0)
MCV: 97.8 fL (ref 78.0–100.0)
MONO ABS: 0.5 10*3/uL (ref 0.1–1.0)
Monocytes Relative: 8 % (ref 3–12)
NEUTROS ABS: 4 10*3/uL (ref 1.7–7.7)
NEUTROS PCT: 65 % (ref 43–77)
Platelets: 210 10*3/uL (ref 150–400)
RBC: 3.13 MIL/uL — AB (ref 4.22–5.81)
RDW: 13.9 % (ref 11.5–15.5)
WBC: 6.1 10*3/uL (ref 4.0–10.5)

## 2014-04-24 NOTE — Progress Notes (Signed)
Patient ID: Adam [name], male   DOB: 03/07/1923, 80 y.o.   MRN: [id number] Patient here today to recheck labs BMP and CBC.

## 2014-04-25 ENCOUNTER — Telehealth: Payer: Self-pay | Admitting: Internal Medicine

## 2014-04-25 ENCOUNTER — Telehealth: Payer: Self-pay

## 2014-04-25 ENCOUNTER — Other Ambulatory Visit (INDEPENDENT_AMBULATORY_CARE_PROVIDER_SITE_OTHER): Payer: Medicare Other

## 2014-04-25 ENCOUNTER — Other Ambulatory Visit: Payer: Self-pay

## 2014-04-25 DIAGNOSIS — K921 Melena: Secondary | ICD-10-CM

## 2014-04-25 DIAGNOSIS — Z1212 Encounter for screening for malignant neoplasm of rectum: Secondary | ICD-10-CM

## 2014-04-25 LAB — POC HEMOCCULT BLD/STL (HOME/3-CARD/SCREEN)
Card #3 Fecal Occult Blood, POC: NEGATIVE
FECAL OCCULT BLD: POSITIVE
FECAL OCCULT BLD: POSITIVE

## 2014-04-25 NOTE — Telephone Encounter (Signed)
This encounter was created in error - please disregard.

## 2014-04-25 NOTE — Telephone Encounter (Signed)
Spoke with patient and advised him of positive hemoccult cards and that we would be referring to GI , patient verbalized understanding

## 2014-04-25 NOTE — Telephone Encounter (Signed)
Spoke with Mardella LaymanLindsey and scheduled with Willette ClusterPaula Guenther, NP on 05/01/14 at 2:30 PM.

## 2014-04-27 ENCOUNTER — Telehealth: Payer: Self-pay | Admitting: *Deleted

## 2014-04-27 ENCOUNTER — Ambulatory Visit (INDEPENDENT_AMBULATORY_CARE_PROVIDER_SITE_OTHER): Payer: Medicare Other | Admitting: Physician Assistant

## 2014-04-27 ENCOUNTER — Encounter: Payer: Self-pay | Admitting: Physician Assistant

## 2014-04-27 VITALS — BP 119/50 | HR 59 | Ht 66.0 in | Wt 156.0 lb

## 2014-04-27 DIAGNOSIS — I4891 Unspecified atrial fibrillation: Secondary | ICD-10-CM

## 2014-04-27 DIAGNOSIS — I480 Paroxysmal atrial fibrillation: Secondary | ICD-10-CM

## 2014-04-27 DIAGNOSIS — M79606 Pain in leg, unspecified: Secondary | ICD-10-CM

## 2014-04-27 DIAGNOSIS — I2510 Atherosclerotic heart disease of native coronary artery without angina pectoris: Secondary | ICD-10-CM

## 2014-04-27 DIAGNOSIS — M79605 Pain in left leg: Secondary | ICD-10-CM

## 2014-04-27 DIAGNOSIS — I10 Essential (primary) hypertension: Secondary | ICD-10-CM

## 2014-04-27 DIAGNOSIS — M79604 Pain in right leg: Secondary | ICD-10-CM

## 2014-04-27 DIAGNOSIS — E782 Mixed hyperlipidemia: Secondary | ICD-10-CM

## 2014-04-27 DIAGNOSIS — M79609 Pain in unspecified limb: Secondary | ICD-10-CM

## 2014-04-27 NOTE — Telephone Encounter (Signed)
Patient just left [place] [facility]ology they suggested pt to stop his aspirin & also lower his qunipril to a 1/2 of pill daily. Pt was told to let us know by [place] so he stopped by & just making sure its ok?

## 2014-04-27 NOTE — Patient Instructions (Signed)
Your physician has requested that you have a lower extremity arterial duplex WITH ABI'S; DX LEG PAIN, WITH DIMINISHED PULSES. This test is an ultrasound of the arteries in the legs or arms. It looks at arterial blood flow in the legs and arms. Allow one hour for Lower and Upper Arterial scans. There are no restrictions or special instructions  Your physician recommends that you continue on your current medications as directed. Please refer to the Current Medication list given to you today.   FOLLOW UP WITH DR. Eden EmmsNISHAN AS PLANNED

## 2014-04-27 NOTE — Progress Notes (Signed)
Cardiology Office Note    Date:  04/27/2014   ID:  Adam [name], DOB 04/01/1923, MRN [id number]  PCP:  Nadean CorwinMCKEOWN,Adam DAVID, MD  Cardiologist:  Dr. Charlton HawsPeter [name]      History of Present Illness: Adam [name] is a 80 y.o. male with a history of CAD status post multiple PCI procedures in the past, angioplasty to the RCA in 1995, paroxysmal atrial fibrillation (failed sotalol and started on amiodarone in 2001), HTN, HL, CKD.  Last seen by Dr. Eden [name] 11/2013.  Patient recently saw his PCP. He was asked to follow up here to evaluate his circulation. He tells me that he has had some leg pain. He saw physical therapy and his leg pain worsened. It was more his right leg than his left. He changed the positioning of his wallet and this seems to have improved some of his symptoms. He denies chest pain. He remains quite active. He plays golf twice a week. He denies significant dyspnea. He denies orthopnea, PND or edema. He denies syncope or dizziness.  Studies:  - LHC (08/1999):  Report not available- reported PDA 90%; medical therapy planned  - abdominal US (03/2011): No AAA   Recent Labs: 04/10/2014: ALT 16; HDL Cholesterol by NMR 44; LDL (calc) 78; TSH 0.650  04/24/2014: Creatinine 1.79*; Hemoglobin 10.5*; Potassium 5.1   Wt Readings from Last 3 Encounters:  04/27/14 156 lb (70.761 kg)  04/10/14 158 lb 9.6 oz (71.94 kg)  11/28/13 165 lb (74.844 kg)     Past Medical History  Diagnosis Date  . Atrial fibrillation   . SINUS BRADYCARDIA   . HYPERTENSION   . CORONARY ARTERY DISEASE   . VITAMIN B12 DEFICIENCY   . NEPHROLITHIASIS, HX OF   . MURMUR     Current Outpatient Prescriptions  Medication Sig Dispense Refill  . amiodarone (PACERONE) 200 MG tablet Take 100 mg by mouth daily.       Marland Kitchen. amLODipine (NORVASC) 5 MG tablet Take 5 mg by mouth daily. Take 1/2 tablet daily.      Marland Kitchen. aspirin 81 MG tablet Take 81 mg by mouth daily.        . Cholecalciferol (VITAMIN D-3) 1000 UNITS CAPS  Take 4,000 Units by mouth daily.      . Levothyroxine Sodium (SYNTHROID PO) Take 75 mcg by mouth.       . quinapril (ACCUPRIL) 40 MG tablet Take 1 tablet (40 mg total) by mouth daily. For BP, heart and kidneys  90 tablet  99  . rosuvastatin (CRESTOR) 10 MG tablet Take 5 mg by mouth daily.      Marland Kitchen. warfarin (COUMADIN) 2.5 MG tablet TAKE ONE TABLET BY MOUTH AS DIRECTED  100 tablet  1   No current facility-administered medications for this visit.    Allergies:   Review of patient's allergies indicates no known allergies.   Social History:  The patient  reports that he has never smoked. He does not have any smokeless tobacco history on file. He reports that he does not drink alcohol or use illicit drugs.   Family History:  The patient's family history includes Colon polyps in an other family member; Heart disease in an other family member.   ROS:  Please see the history of present illness.      All other systems reviewed and negative.   PHYSICAL EXAM: VS:  BP 119/50  Pulse 59  Ht 5\' 6"  (1.676 m)  Wt 156 lb (70.761 kg)  BMI 25.19 kg/m2  Well nourished, well developed, in no acute distress HEENT: normal Neck: no JVD Cardiac:  normal S1, S2; RRR; 1/6 systolic murmurat the RUSB Lungs:  clear to auscultation bilaterally, no wheezing, rhonchi or rales Abd: soft, nontender, no hepatomegaly Ext: no edema Vascular: Popliteal, PT, DP 2+ on the right; trace to 1+ on the left Skin: warm and dry Neuro:  CNs 2-12 intact, no focal abnormalities noted  EKG:  Sinus bradycardia, HR 59, LAD, first degree AV block (PR 214 ms), nonspecific ST-T wave changes, no significant change when compared to prior tracing, QTc 431     ASSESSMENT AND PLAN:  1. Pain of lower extremity, unspecified laterality:  His symptoms are somewhat atypical. However, his pulses on the left are diminished. Arrange ABIs. 2. CORONARY ARTERY DISEASE:  Continue aspirin, statin. No angina. 3. Paroxysmal atrial fibrillation:  Maintaining  NSR. Continue Coumadin, amiodarone.  Recent LFTs and TSH okay. 4. Hypertension:  Controlled. 5. Hyperlipidemia:  Recent LDL optimal. Continue statin.  6. Disposition: Followup with Dr. Eden [name] as planned.    Signed, Adam RimScott Weaver, PA-C, MHS 04/27/2014 10:19 AM    [facility] [address], [place], [place]  [address] Phone: [phone]; Fax: [phone]

## 2014-05-01 ENCOUNTER — Other Ambulatory Visit (INDEPENDENT_AMBULATORY_CARE_PROVIDER_SITE_OTHER): Payer: Medicare Other

## 2014-05-01 ENCOUNTER — Ambulatory Visit (INDEPENDENT_AMBULATORY_CARE_PROVIDER_SITE_OTHER): Payer: Medicare Other | Admitting: Nurse Practitioner

## 2014-05-01 ENCOUNTER — Encounter: Payer: Self-pay | Admitting: Nurse Practitioner

## 2014-05-01 VITALS — BP 122/72 | HR 66 | Ht 66.0 in | Wt 157.8 lb

## 2014-05-01 DIAGNOSIS — R195 Other fecal abnormalities: Secondary | ICD-10-CM

## 2014-05-01 LAB — FERRITIN: Ferritin: 132.6 ng/mL (ref 22.0–322.0)

## 2014-05-01 LAB — CBC
HEMATOCRIT: 31.7 % — AB (ref 39.0–52.0)
Hemoglobin: 10.9 g/dL — ABNORMAL LOW (ref 13.0–17.0)
MCHC: 34.2 g/dL (ref 30.0–36.0)
MCV: 101.2 fl — AB (ref 78.0–100.0)
Platelets: 203 10*3/uL (ref 150.0–400.0)
RBC: 3.14 Mil/uL — ABNORMAL LOW (ref 4.22–5.81)
RDW: 13.6 % (ref 11.5–15.5)
WBC: 9.2 10*3/uL (ref 4.0–10.5)

## 2014-05-01 NOTE — Progress Notes (Signed)
    HPI :  Patient is an 80 year old male known remotely to [place]. He is referred for evaluation of heme positive stool. Hgb down slightly from his baseline. No overt bleeding. No abdominal pain, weight loss, bowel changes or other GI complaints. Patient on coumadin and he takes a daily baby aspirin. His last colonoscopy was greater than 10 years ago [facility].    Past Medical History  Diagnosis Date  . Atrial fibrillation   . SINUS BRADYCARDIA   . HYPERTENSION   . CORONARY ARTERY DISEASE   . VITAMIN B12 DEFICIENCY   . NEPHROLITHIASIS, HX OF   . MURMUR     Family History  Problem Relation Age of Onset  . Colon polyps    . Heart disease     History  Substance Use Topics  . Smoking status: Never Smoker   . Smokeless tobacco: Not on file  . Alcohol Use: No   Current Outpatient Prescriptions  Medication Sig Dispense Refill  . amiodarone (PACERONE) 200 MG tablet Take 100 mg by mouth daily.       Marland Kitchen. amLODipine (NORVASC) 5 MG tablet Take 5 mg by mouth daily. Take 1/2 tablet daily.      Marland Kitchen. aspirin 81 MG tablet Take 81 mg by mouth daily.        . Cholecalciferol (VITAMIN D-3) 1000 UNITS CAPS Take 4,000 Units by mouth daily.      . Levothyroxine Sodium (SYNTHROID PO) Take 75 mcg by mouth.       . quinapril (ACCUPRIL) 40 MG tablet Take 1 tablet (40 mg total) by mouth daily. For BP, heart and kidneys  90 tablet  99  . rosuvastatin (CRESTOR) 10 MG tablet Take 5 mg by mouth daily.      Marland Kitchen. warfarin (COUMADIN) 2.5 MG tablet TAKE ONE TABLET BY MOUTH AS DIRECTED  100 tablet  1   No current facility-administered medications for this visit.   No Known Allergies   Review of Systems: All systems reviewed and negative except where noted in HPI.   Physical Exam: BP 122/72  Pulse 66  Ht 5\' 6"  (1.676 m)  Wt 157 lb 12.8 oz (71.578 kg)  BMI 25.48 kg/m2 Constitutional: Pleasant,well-developed, white male in no acute distress. HEENT: Normocephalic and atraumatic. Conjunctivae are normal.  No scleral icterus. Neck supple.  Cardiovascular: Normal rate, regular rhythm.  Pulmonary/chest: Effort normal and breath sounds normal. No wheezing, rales or rhonchi. Abdominal: Soft, nondistended, nontender. Bowel sounds active throughout. There are no masses palpable. No hepatomegaly. Extremities: no edema Lymphadenopathy: No cervical adenopathy noted. Neurological: Alert and oriented to person place and time. Skin: Skin is warm and dry. No rashes noted. Psychiatric: Normal mood and affect. Behavior is normal.   ASSESSMENT AND PLAN:  80 year old male with heme positive stool x2 on coumadin. Hgb down slightly. No overt bleeding. No GI compaints. Patient thought he was coming here to address circulation in feet. He doesn't want to pursue a GI workup. Patient feels well and has a lot of energy . We discussed possibilities such as erosive disase, PUD, AVMs, and colon cancer. For now will obtain a repeat CBC, will also check Ferritin. If labs abnormal patient should have further evaluation of occult bleeding.

## 2014-05-01 NOTE — Patient Instructions (Signed)
Your physician has requested that you go to the basement for the following lab work before leaving today: Ferritin  Your physician has requested that you go to the basement for the following lab work on 05-31-2014: [facility] Lab is open 730 am-530 pm

## 2014-05-02 ENCOUNTER — Encounter: Payer: Self-pay | Admitting: Nurse Practitioner

## 2014-05-02 ENCOUNTER — Other Ambulatory Visit: Payer: Self-pay | Admitting: Nurse Practitioner

## 2014-05-02 DIAGNOSIS — R195 Other fecal abnormalities: Secondary | ICD-10-CM

## 2014-05-02 NOTE — Patient Instructions (Signed)
Lab Error--Future Lab was drawn. Order put back in.

## 2014-05-03 ENCOUNTER — Ambulatory Visit (HOSPITAL_COMMUNITY): Payer: Medicare Other | Attending: Cardiovascular Disease | Admitting: Radiology

## 2014-05-03 DIAGNOSIS — I739 Peripheral vascular disease, unspecified: Secondary | ICD-10-CM

## 2014-05-03 DIAGNOSIS — I4891 Unspecified atrial fibrillation: Secondary | ICD-10-CM | POA: Insufficient documentation

## 2014-05-03 DIAGNOSIS — R0989 Other specified symptoms and signs involving the circulatory and respiratory systems: Secondary | ICD-10-CM | POA: Insufficient documentation

## 2014-05-03 DIAGNOSIS — E785 Hyperlipidemia, unspecified: Secondary | ICD-10-CM | POA: Insufficient documentation

## 2014-05-03 DIAGNOSIS — M79609 Pain in unspecified limb: Secondary | ICD-10-CM | POA: Insufficient documentation

## 2014-05-03 DIAGNOSIS — I10 Essential (primary) hypertension: Secondary | ICD-10-CM | POA: Insufficient documentation

## 2014-05-03 DIAGNOSIS — M79605 Pain in left leg: Secondary | ICD-10-CM

## 2014-05-03 DIAGNOSIS — I2510 Atherosclerotic heart disease of native coronary artery without angina pectoris: Secondary | ICD-10-CM | POA: Insufficient documentation

## 2014-05-03 DIAGNOSIS — M79604 Pain in right leg: Secondary | ICD-10-CM

## 2014-05-03 NOTE — Progress Notes (Signed)
Reviewed. OK to observe.

## 2014-05-03 NOTE — Progress Notes (Signed)
Lower arterial Doppler performed  

## 2014-05-07 ENCOUNTER — Encounter: Payer: Self-pay | Admitting: Physician Assistant

## 2014-05-07 ENCOUNTER — Telehealth: Payer: Self-pay | Admitting: *Deleted

## 2014-05-07 NOTE — Telephone Encounter (Signed)
pt notified about LEA results with verbal understanding

## 2014-05-10 ENCOUNTER — Ambulatory Visit: Payer: Self-pay | Admitting: Internal Medicine

## 2014-05-10 ENCOUNTER — Ambulatory Visit (INDEPENDENT_AMBULATORY_CARE_PROVIDER_SITE_OTHER): Payer: Medicare Other | Admitting: Internal Medicine

## 2014-05-10 ENCOUNTER — Encounter: Payer: Self-pay | Admitting: Internal Medicine

## 2014-05-10 VITALS — BP 122/56 | HR 56 | Temp 97.6°F | Resp 16 | Ht 67.75 in | Wt 157.4 lb

## 2014-05-10 DIAGNOSIS — N183 Chronic kidney disease, stage 3 unspecified: Secondary | ICD-10-CM

## 2014-05-10 DIAGNOSIS — Z79899 Other long term (current) drug therapy: Secondary | ICD-10-CM

## 2014-05-10 LAB — BASIC METABOLIC PANEL WITH GFR
BUN: 34 mg/dL — ABNORMAL HIGH (ref 6–23)
CO2: 26 meq/L (ref 19–32)
CREATININE: 1.65 mg/dL — AB (ref 0.50–1.35)
Calcium: 9.1 mg/dL (ref 8.4–10.5)
Chloride: 106 mEq/L (ref 96–112)
GFR, Est African American: 42 mL/min — ABNORMAL LOW
GFR, Est Non African American: 37 mL/min — ABNORMAL LOW
GLUCOSE: 102 mg/dL — AB (ref 70–99)
Potassium: 4.9 mEq/L (ref 3.5–5.3)
Sodium: 141 mEq/L (ref 135–145)

## 2014-05-10 MED ORDER — OMEPRAZOLE 20 MG PO CPDR: 20.0000 mg | DELAYED_RELEASE_CAPSULE | Freq: Two times a day (BID) | ORAL | Status: DC

## 2014-05-10 NOTE — Patient Instructions (Signed)
Chronic Kidney Disease  Chronic kidney disease occurs when the kidneys are damaged over a long period. The kidneys are two organs that lie on either side of the spine between the middle of the back and the front of the abdomen. The kidneys:   · Remove wastes and extra water from the blood.    · Produce important hormones. These help keep bones strong, regulate blood pressure, and help create red blood cells.    · Balance the fluids and chemicals in the blood and tissues.  A small amount of kidney damage may not cause problems, but a large amount of damage may make it difficult or impossible for the kidneys to work the way they should. If steps are not taken to slow down the kidney damage or stop it from getting worse, the kidneys may stop working permanently. Most of the time, chronic kidney disease does not go away. However, it can often be controlled, and those with the disease can usually live normal lives.  CAUSES   The most common causes of chronic kidney disease are diabetes and high blood pressure (hypertension). Chronic kidney disease may also be caused by:   · Diseases that cause kidneys' filters to become inflamed.    · Diseases that affect the immune system.    · Genetic diseases.    · Medicines that damage the kidneys, such as anti-inflammatory medicines.    · Poisoning or exposure to toxic substances.    · A reoccurring kidney or urinary infection.    · A problem with urine flow. This may be caused by:    ¨ Cancer.    ¨ Kidney stones.    ¨ An enlarged prostate in males.  SYMPTOMS   Because the kidney damage in chronic kidney disease occurs slowly, symptoms develop slowly and may not be obvious until the kidney damage becomes severe. A person may have a kidney disease for years without showing any symptoms. Symptoms can include:   · Swelling (edema) of the legs, ankles, or feet.    · Tiredness (lethargy).    · Nausea or vomiting.    · Confusion.    · Problems with urination, such as:    ¨ Decreased urine  production.    ¨ Frequent urination, especially at night.    ¨ Frequent accidents in children who are potty trained.    · Muscle twitches and cramps.    · Shortness of breath.   · Weakness.    · Persistent itchiness.    · Loss of appetite.  · Metallic taste in the mouth.  · Trouble sleeping.  · Slowed development in children.  · Short stature in children.  DIAGNOSIS   Chronic kidney disease may be detected and diagnosed by tests, including blood, urine, imaging, or kidney biopsy tests.   TREATMENT   Most chronic kidney diseases cannot be cured. Treatment usually involves relieving symptoms and preventing or slowing the progression of the disease. Treatment may include:   · A special diet. You may need to avoid alcohol and foods that are salty and high in potassium.    · Medicines. These may:    ¨ Lower blood pressure.    ¨ Relieve anemia.    ¨ Relieve swelling.    ¨ Protect the bones.  HOME CARE INSTRUCTIONS   · Follow your prescribed diet.    · Only take over-the-counter or prescription medicines as directed by your caregiver.   · Do not take any new medicines (prescription, over-the-counter, or nutritional supplements) unless approved by your caregiver. Many medicines can worsen your kidney damage or need to have the dose adjusted.    · Quit smoking if you are a   smoker. Talk to your caregiver about a smoking cessation program.    · Keep all follow-up appointments as directed by your caregiver.  SEEK IMMEDIATE MEDICAL CARE IF:  · Your symptoms get worse or you develop new symptoms.    · You develop symptoms of end-stage kidney disease. These include:    ¨ Headaches.    ¨ Abnormally dark or light skin.    ¨ Numbness in the hands or feet.    ¨ Easy bruising.    ¨ Frequent hiccups.    ¨ Menstruation stops.    · You have a fever.    · You have decreased urine production.    · You have pain or bleeding when urinating.  MAKE SURE YOU:  · Understand these instructions.  · Will watch your condition.  · Will get help right  away if you are not doing well or get worse.  FOR MORE INFORMATION   American Association of Kidney Patients: www.aakp.org  National Kidney Foundation: www.kidney.org  American Kid[employer] Fund: www.akfinc.org  [employer] [facility]: www.lifeoptions.org and www.kidneyschool.org  Document Released: 07/28/2008 Document Revised: 10/05/2012 Document Reviewed: 06/17/2012  ExitCare® Patient Information ©2015 [employer]. This information is not intended to replace advice given to you by your health care provider. Make sure you discuss any questions you have with your health care provider.

## 2014-05-10 NOTE — Progress Notes (Signed)
Subjective:    Patient ID: Adam [name], male    DOB: 1923-06-27, 80 y.o.   MRN: [id number]  HPI An 80 yo MWM with/HTN, ASCAD/PTCA/pAfib, T2_NIDDM w/Stage 3 CKD, ASCAD presents for f/u of kidney functions. Recent labs showed BUN/Creat 36/1.79 w/ GFR 33 ml/min. Patient relates "trying to drink more water" He denies any chest pain, palpitations, dyspnea, postural dizziness or edema.  Current Outpatient Prescriptions on File Prior to Visit  Medication Sig Dispense Refill  . amiodarone (PACERONE) 200 MG tablet Take 100 mg by mouth daily.       Marland Kitchen amLODipine (NORVASC) 5 MG tablet Take 5 mg by mouth daily. Take 1/2 tablet daily.      Marland Kitchen aspirin 81 MG tablet Take 81 mg by mouth daily.        . Cholecalciferol (VITAMIN D-3) 1000 UNITS CAPS Take 4,000 Units by mouth daily.      . Levothyroxine Sodium (SYNTHROID PO) Take 75 mcg by mouth.       . quinapril (ACCUPRIL) 40 MG tablet Take 1 tablet (40 mg total) by mouth daily. For BP, heart and kidneys  90 tablet  99  . rosuvastatin (CRESTOR) 10 MG tablet Take 5 mg by mouth daily.      Marland Kitchen warfarin (COUMADIN) 2.5 MG tablet TAKE ONE TABLET BY MOUTH AS DIRECTED  100 tablet  1   No current facility-administered medications on file prior to visit.   No Known Allergies Past Medical History  Diagnosis Date  . Atrial fibrillation   . SINUS BRADYCARDIA   . HYPERTENSION   . CORONARY ARTERY DISEASE   . VITAMIN B12 DEFICIENCY   . NEPHROLITHIASIS, HX OF   . MURMUR   . Leg pain     a. ABI (05/2014):  ABIs falsely elevated due to calcification; TBIs ok; dopplers without significant stenosis   Review of Systems In addition to the HPI above,  No Fever-chills,  No Headache, No changes with Vision or hearing,  No problems swallowing food or Liquids,  No Chest pain or productive Cough or Shortness of Breath,  No Abdominal pain, No Nausea or Vomitting, Bowel movements are regular,  No Blood in stool or Urine,  No dysuria,  No new skin rashes or bruises,  No  new joints pains-aches,  No new weakness, tingling, numbness in any extremity,  No recent weight loss,  No polyuria, polydypsia or polyphagia,  No significant Mental Stressors.  A full 10 point Review of Systems was done, except as stated above, all other Review of Systems were negative  Objective:   Physical Exam BP 122/56  Pulse 56  Temp(Src) 97.6 F (36.4 C) (Temporal)  Resp 16  Ht 5' 7.75" (1.721 m)  Wt 157 lb 6.4 oz (71.396 kg)  BMI 24.11 kg/m2  HEENT - Eac's patent. TM's Nl. EOM's full. PERRLA. NasoOroPharynx clear. Neck - supple. Nl Thyroid. Carotids 2+ & No bruits, nodes, JVD Chest - Clear equal BS w/o Rales, rhonchi, wheezes. Cor - Nl HS. RRR w/M w/oGR. PP 1(+). No edema. Abd - No palpable organomegaly, masses or tenderness. BS nl. MS- FROM w/o deformities. Muscle power, tone and bulk Nl. Gait Nl. Neuro - No obvious Cr N abnormalities. Sensory, motor and Cerebellar functions appear Nl w/o focal abnormalities. Psyche - Mental status normal & appropriate.  No delusions, ideations or obvious mood abnormalities.  Assessment & Plan:   1. CKD (chronic kidney disease) stage 3, GFR 30-59 ml/min - encouraged to drink more water  2.  Encounter for long-term (current) use of other medications - BASIC METABOLIC PANEL WITH GFR

## 2014-05-17 ENCOUNTER — Ambulatory Visit (INDEPENDENT_AMBULATORY_CARE_PROVIDER_SITE_OTHER): Payer: Medicare Other | Admitting: *Deleted

## 2014-05-17 DIAGNOSIS — Z5181 Encounter for therapeutic drug level monitoring: Secondary | ICD-10-CM

## 2014-05-17 DIAGNOSIS — I4891 Unspecified atrial fibrillation: Secondary | ICD-10-CM

## 2014-05-17 DIAGNOSIS — Z8679 Personal history of other diseases of the circulatory system: Secondary | ICD-10-CM

## 2014-05-17 LAB — POCT INR: INR: 2.9

## 2014-05-17 NOTE — Telephone Encounter (Signed)
Close Encounter 

## 2014-06-04 ENCOUNTER — Other Ambulatory Visit: Payer: Medicare Other

## 2014-06-14 ENCOUNTER — Other Ambulatory Visit: Payer: Medicare Other

## 2014-06-29 ENCOUNTER — Ambulatory Visit (INDEPENDENT_AMBULATORY_CARE_PROVIDER_SITE_OTHER): Payer: Medicare Other

## 2014-06-29 DIAGNOSIS — I4891 Unspecified atrial fibrillation: Secondary | ICD-10-CM

## 2014-06-29 DIAGNOSIS — Z8679 Personal history of other diseases of the circulatory system: Secondary | ICD-10-CM

## 2014-06-29 DIAGNOSIS — Z5181 Encounter for therapeutic drug level monitoring: Secondary | ICD-10-CM

## 2014-06-29 LAB — POCT INR: INR: 3.5

## 2014-07-19 ENCOUNTER — Ambulatory Visit: Payer: Self-pay | Admitting: Physician Assistant

## 2014-07-27 ENCOUNTER — Ambulatory Visit (INDEPENDENT_AMBULATORY_CARE_PROVIDER_SITE_OTHER): Payer: Medicare Other

## 2014-07-27 DIAGNOSIS — Z8679 Personal history of other diseases of the circulatory system: Secondary | ICD-10-CM

## 2014-07-27 DIAGNOSIS — Z5181 Encounter for therapeutic drug level monitoring: Secondary | ICD-10-CM

## 2014-07-27 DIAGNOSIS — I4891 Unspecified atrial fibrillation: Secondary | ICD-10-CM

## 2014-07-27 LAB — POCT INR: INR: 2.4

## 2014-07-31 ENCOUNTER — Telehealth: Payer: Self-pay | Admitting: *Deleted

## 2014-07-31 NOTE — Telephone Encounter (Signed)
Marcelino DusterMichelle from [facility] called and informed Dr Oneta RackMcKeown that patient refused the referral to them.

## 2014-08-09 ENCOUNTER — Ambulatory Visit (INDEPENDENT_AMBULATORY_CARE_PROVIDER_SITE_OTHER): Payer: Medicare Other | Admitting: *Deleted

## 2014-08-09 DIAGNOSIS — Z23 Encounter for immunization: Secondary | ICD-10-CM

## 2014-08-16 ENCOUNTER — Telehealth: Payer: Self-pay | Admitting: *Deleted

## 2014-08-16 ENCOUNTER — Other Ambulatory Visit: Payer: Self-pay | Admitting: *Deleted

## 2014-08-16 DIAGNOSIS — E538 Deficiency of other specified B group vitamins: Secondary | ICD-10-CM

## 2014-08-16 NOTE — Telephone Encounter (Signed)
Spoke with patient and he will come for lab. 

## 2014-08-16 NOTE — Telephone Encounter (Signed)
Message copied by Daphine DeutscherMILLER, Sherlonda Flater N on Thu Aug 16, 2014  9:24 AM ------      Message from: Daphine DeutscherMILLER, Labrenda Lasky N      Created: Tue Feb 13, 2014  9:49 AM       Call and remind patient due for B12 level for DB on 08/20/14. Lab in [facility]. ------

## 2014-08-20 ENCOUNTER — Other Ambulatory Visit (INDEPENDENT_AMBULATORY_CARE_PROVIDER_SITE_OTHER): Payer: Medicare Other

## 2014-08-20 DIAGNOSIS — E538 Deficiency of other specified B group vitamins: Secondary | ICD-10-CM

## 2014-08-20 LAB — VITAMIN B12: VITAMIN B 12: 394 pg/mL (ref 211–911)

## 2014-08-21 ENCOUNTER — Other Ambulatory Visit: Payer: Self-pay | Admitting: *Deleted

## 2014-08-21 DIAGNOSIS — E538 Deficiency of other specified B group vitamins: Secondary | ICD-10-CM

## 2014-08-24 ENCOUNTER — Ambulatory Visit (INDEPENDENT_AMBULATORY_CARE_PROVIDER_SITE_OTHER): Payer: Medicare Other

## 2014-08-24 DIAGNOSIS — I4891 Unspecified atrial fibrillation: Secondary | ICD-10-CM

## 2014-08-24 DIAGNOSIS — Z5181 Encounter for therapeutic drug level monitoring: Secondary | ICD-10-CM

## 2014-08-24 DIAGNOSIS — Z8679 Personal history of other diseases of the circulatory system: Secondary | ICD-10-CM

## 2014-08-24 LAB — POCT INR: INR: 2.2

## 2014-09-21 ENCOUNTER — Ambulatory Visit (INDEPENDENT_AMBULATORY_CARE_PROVIDER_SITE_OTHER): Payer: Medicare Other

## 2014-09-21 DIAGNOSIS — Z5181 Encounter for therapeutic drug level monitoring: Secondary | ICD-10-CM

## 2014-09-21 DIAGNOSIS — I4891 Unspecified atrial fibrillation: Secondary | ICD-10-CM

## 2014-09-21 DIAGNOSIS — Z8679 Personal history of other diseases of the circulatory system: Secondary | ICD-10-CM

## 2014-09-21 LAB — POCT INR: INR: 2.2

## 2014-09-25 ENCOUNTER — Other Ambulatory Visit: Payer: Self-pay | Admitting: Cardiovascular Disease

## 2014-10-04 ENCOUNTER — Other Ambulatory Visit: Payer: Self-pay

## 2014-10-04 DIAGNOSIS — I10 Essential (primary) hypertension: Secondary | ICD-10-CM

## 2014-10-04 MED ORDER — QUINAPRIL HCL 40 MG PO TABS: 40.0000 mg | ORAL_TABLET | Freq: Every day | ORAL | Status: DC

## 2014-10-05 ENCOUNTER — Other Ambulatory Visit: Payer: Self-pay | Admitting: *Deleted

## 2014-10-05 MED ORDER — WARFARIN SODIUM 2.5 MG PO TABS: 2.5000 mg | ORAL_TABLET | ORAL | Status: DC

## 2014-10-09 ENCOUNTER — Other Ambulatory Visit: Payer: Self-pay | Admitting: *Deleted

## 2014-10-09 MED ORDER — WARFARIN SODIUM 2.5 MG PO TABS: 2.5000 mg | ORAL_TABLET | ORAL | Status: DC

## 2014-10-19 ENCOUNTER — Ambulatory Visit (INDEPENDENT_AMBULATORY_CARE_PROVIDER_SITE_OTHER): Payer: Medicare Other

## 2014-10-19 DIAGNOSIS — Z8679 Personal history of other diseases of the circulatory system: Secondary | ICD-10-CM

## 2014-10-19 DIAGNOSIS — Z5181 Encounter for therapeutic drug level monitoring: Secondary | ICD-10-CM

## 2014-10-19 DIAGNOSIS — I4891 Unspecified atrial fibrillation: Secondary | ICD-10-CM

## 2014-10-19 LAB — POCT INR: INR: 2.3

## 2014-10-23 ENCOUNTER — Ambulatory Visit (INDEPENDENT_AMBULATORY_CARE_PROVIDER_SITE_OTHER): Payer: Medicare Other | Admitting: Internal Medicine

## 2014-10-23 ENCOUNTER — Encounter: Payer: Self-pay | Admitting: Internal Medicine

## 2014-10-23 VITALS — BP 108/42 | HR 64 | Temp 98.1°F | Resp 16 | Ht 68.5 in | Wt 154.4 lb

## 2014-10-23 DIAGNOSIS — Z0001 Encounter for general adult medical examination with abnormal findings: Secondary | ICD-10-CM

## 2014-10-23 DIAGNOSIS — I10 Essential (primary) hypertension: Secondary | ICD-10-CM

## 2014-10-23 DIAGNOSIS — Z79899 Other long term (current) drug therapy: Secondary | ICD-10-CM | POA: Insufficient documentation

## 2014-10-23 DIAGNOSIS — E559 Vitamin D deficiency, unspecified: Secondary | ICD-10-CM

## 2014-10-23 DIAGNOSIS — R6889 Other general symptoms and signs: Secondary | ICD-10-CM

## 2014-10-23 DIAGNOSIS — E1129 Type 2 diabetes mellitus with other diabetic kidney complication: Secondary | ICD-10-CM

## 2014-10-23 DIAGNOSIS — E782 Mixed hyperlipidemia: Secondary | ICD-10-CM

## 2014-10-23 NOTE — Progress Notes (Signed)
Patient ID: Adam [name], male   DOB: 1922/12/25, 80 y.o.   MRN: [id number]  MEDICARE ANNUAL WELLNESS VISIT AND OV  Assessment:   1. Essential hypertension  - TSH  2. Hyperlipidemia  - Lipid panel  3. Type 2 diabetes mellitus with other diabetic kidney complication  - Hemoglobin A1c - Insulin, fasting  4. Vitamin D deficiency  - Vit D  25 hydroxy (rtn osteoporosis monitoring)  5. Medication management  - CBC with Differential - BASIC METABOLIC PANEL WITH GFR - Hepatic function panel - Magnesium  6. Encounter for general adult medical examination with abnormal findings   Plan:   During the course of the visit the patient was educated and counseled about appropriate screening and preventive services including:    Pneumococcal vaccine   Influenza vaccine  Td vaccine  Screening electrocardiogram  Bone densitometry screening  Colorectal cancer screening  Diabetes screening  Glaucoma screening  Nutrition counseling   Advanced directives: requested  Screening recommendations, referrals: Vaccinations: DT vaccine 10/2009 Influenza vaccine HD 08/09/2014 Pneumococcal vaccine 01/08/2008 Prevnar vaccine declined Shingles vaccine 10/07/2009 Hep B vaccine not indicated  Nutrition assessed and recommended  [facility] Recommended yearly ophthalmology/optometry visit for glaucoma screening and checkup Recommended yearly dental visit for hygiene and checkup Advanced directives - yes  Conditions/risks identified: BMI: Discussed weight loss, diet, and increase physical activity.  Increase physical activity: AHA recommends 150 minutes of physical activity a week.  Medications reviewed Diabetes is not at goal, ACE/ARB therapy: Yes.  Urinary Incontinence is not an issue: discussed non pharmacology and pharmacology options.  Fall risk: low- discussed PT, home fall assessment, medications.   Subjective:  Adam [name] is a 80 y.o. MWM  who presents for Medicare Annual Wellness Visit and  OV.  Date of last medicare wellness visit is unknown.  He has had elevated blood pressure since 1970's. He had PTCA x 2 in 1998 / 2000 and in 2000 was started on Coumadin for ch Afib. He is followed by Dr Eden Emms for Cardiology. His blood pressure has been controlled at home, today their BP is BP was 108/42 mmHg He does workout. He denies chest pain, shortness of breath, dizziness.   He is on cholesterol medication and denies myalgias. His cholesterol is at goal. The cholesterol last visit was:  Lab Results  Component Value Date   CHOL 151 04/10/2014   HDL 44 04/10/2014   LDLCALC 78 04/10/2014   TRIG 144 04/10/2014   CHOLHDL 3.4 04/10/2014   He has had T2_NIDDM for 4 years since May 2011 with A1c was 6.8% and he has attempted to control this with diet. He has been working on diet and exercise for diabetes, and denies foot ulcerations, hyperglycemia, hypoglycemia , nausea, paresthesia of the feet, polydipsia, polyuria and visual disturbances. Last A1C in the office was:  Lab Results  Component Value Date   HGBA1C 6.6* 04/10/2014   Patient is on Vitamin D supplement.   Lab Results  Component Value Date   VD25OH 75 04/10/2014     Names of Other Physician/Practitioners you currently use: 1. [facility] here for primary care 2. Dr Elmer Picker, eye doctor, last visit 2015 3. Dr Teena Dunk, DDS, dentist, last visit 2015  Patient Care Team: Lucky Cowboy, MD as PCP - General (Internal Medicine) Wendall Stade, MD as Consulting Physician (Cardiology) Delon Sacramento, MD as Consulting Physician (Ophthalmology)  Medication Review: Medication Sig  . amiodarone (PACERONE) 200 MG tablet  Take 100 mg by mouth daily.   Marland Kitchen. amLODipine (NORVASC) 5 MG tablet Take 5 mg by mouth daily. Take 1/2 tablet daily.  Marland Kitchen. aspirin 81 MG tablet Take 81 mg by mouth daily.    . Cholecalciferol (VITAMIN D-3) 1000 UNITS CAPS Take 4,000  Units by mouth daily.  . Levothyroxine Sodium (SYNTHROID PO) Take 75 mcg by mouth.   Marland Kitchen. omeprazole (PRILOSEC) 20 MG capsule Take 1 capsule (20 mg total) by mouth 2 (two) times daily.  . quinapril (ACCUPRIL) 40 MG tablet Take 1 tablet (40 mg total) by mouth daily. For BP, heart and kidneys  . rosuvastatin (CRESTOR) 10 MG tablet Take 5 mg by mouth daily.  Marland Kitchen. warfarin (COUMADIN) 2.5 MG tablet Take 1 tablet (2.5 mg total) by mouth as directed.   Current Problems (verified) Patient Active Problem List   Diagnosis Date Noted  . Medication management 10/23/2014  . Type II diabetes mellitus with renal manifestations 10/17/2013  . Hyperlipidemia 10/17/2013  . Vitamin D deficiency 10/17/2013  . Atrial fibrillation 12/16/2010  . VITAMIN B12 DEFICIENCY 01/13/2008  . Essential hypertension 01/13/2008  . CORONARY ARTERY DISEASE 01/13/2008  . NEPHROLITHIASIS, HX OF 01/13/2008   Screening Tests Health Maintenance  Topic Date Due  . OPHTHALMOLOGY EXAM  10/11/1933  . HEMOGLOBIN A1C  10/10/2014  . TETANUS/TDAP  11/02/2014  . FOOT EXAM  04/11/2015  . URINE MICROALBUMIN  04/11/2015  . INFLUENZA VACCINE  06/03/2015  . COLONOSCOPY  11/03/2015  . PNEUMOCOCCAL POLYSACCHARIDE VACCINE AGE 1 AND OVER  Completed  . ZOSTAVAX  Completed    Immunization History  Administered Date(s) Administered  . Influenza Split 07/06/2013  . Influenza, High Dose Seasonal PF 08/09/2014  . Pneumococcal Polysaccharide-23 01/18/2008  . Td 11/02/2004  . Zoster 10/07/2009   Preventative care: [facility]  Prior vaccinations: TD : 10/07/2009  Influenza: HD 190/06/2014  Pneumococcal: 01/18/2008 Shingles/Zostavax: 12/06 2010  History reviewed: allergies, current medications, past family history, past medical history, past social history, past surgical history and problem list  Risk Factors: Tobacco History  Substance Use Topics  . Smoking status: Never Smoker   . Smokeless tobacco: Not on  file  . Alcohol Use: No   He does not smoke.  Patient is not a former smoker. Are there smokers in your home (other than you)?  No  Alcohol Current alcohol use: none  Caffeine Current caffeine use: denies use  Exercise Current exercise: walking, yard work and rides golf cart !  Nutrition/Diet Current diet: in general, a "healthy" diet    Cardiac risk factors: advanced age (older than 7255 for men, 665 for women), diabetes mellitus, dyslipidemia, family history of premature cardiovascular disease, hypertension, male gender and sedentary lifestyle.  Depression Screen (Note: if answer to either of the following is "Yes", a more complete depression screening is indicated)   Q1: Over the past two weeks, have you felt down, depressed or hopeless? No  Q2: Over the past two weeks, have you felt little interest or pleasure in doing things? No  Have you lost interest or pleasure in daily life? No  Do you often feel hopeless? No  Do you cry easily over simple problems? No  Activities of Daily Living In your present state of health, do you have any difficulty performing the following activities?:  Driving? No Managing money?  No Feeding yourself? No Getting from bed to chair? No Climbing a flight of stairs? No Preparing food and eating?: No Bathing or showering? No  Getting dressed: No Getting to the toilet? No Using the toilet:No Moving around from place to place: No In the past year have you fallen or had a near fall?:No   Are you sexually active?  No  Do you have more than one partner?  No  Vision Difficulties: No  Hearing Difficulties: No Do you often ask people to speak up or repeat themselves? No Do you experience ringing or noises in your ears? No Do you have difficulty understanding soft or whispered voices? No  Cognition  Do you feel that you have a problem with memory?No  Do you often misplace items? No  Do you feel safe at home?  Yes  Advanced directives Does  patient have a Health Care Power of Attorney? Yes Does patient have a Living Will? Yes  In addition to the HPI above,  No Fever-chills,  No Headache, No changes with Vision or hearing,  No problems swallowing food or Liquids,  No Chest pain or productive Cough or Shortness of Breath,  No Abdominal pain, No Nausea or Vomitting, Bowel movements are regular,  No Blood in stool or Urine,  No dysuria,  No new skin rashes or bruises,  No new joints pains-aches,  No new weakness, tingling, numbness in any extremity,  No recent weight loss,  No polyuria, polydypsia or polyphagia,  No significant Mental Stressors.  A full 10 point Review of Systems was done, except as stated above, all other Review of Systems were negative  Objective:  BP 108/42   Pulse 64  Temp  98.1 F   Resp 16  Ht 5' 8.5"   Wt 154 lb 6.4 oz   BMI 23.13   General appearance: alert, no distress, WD/WN, male Cognitive Testing  Alert? Yes  Normal Appearance? Yes  Oriented to person? Yes  Place? Yes   Time? Yes  Recall of three objects?  Yes  Can perform simple calculations? Yes  Displays appropriate judgment? Yes  Can read the correct time from a watch/clock? Yes  HEENT: normocephalic, sclerae anicteric, TMs pearly, nares patent, no discharge or erythema, pharynx normal Oral cavity: MMM, no lesions Neck: supple, no lymphadenopathy, no thyromegaly, no masses Heart: RRR, normal S1, S2, no murmurs Lungs: CTA bilaterally, no wheezes, rhonchi, or rales Abdomen: +bs, soft, non tender, non distended, no masses, no hepatomegaly, no splenomegaly Musculoskeletal: nontender, no swelling, no obvious deformity Extremities: no edema, no cyanosis, no clubbing Pulses: 2+ symmetric, upper and lower extremities, normal cap refill Neurological: alert, oriented x 3, CN2-12 intact, strength normal upper extremities and lower extremities, sensation normal throughout, DTRs 2+ throughout, no cerebellar signs, gait normal Psychiatric:  normal affect, behavior normal, pleasant   Medicare Attestation I have personally reviewed: The patient's medical and social history Their use of alcohol, tobacco or illicit drugs Their current medications and supplements The patient's functional ability including ADLs,fall risks, home safety risks, cognitive, and hearing and visual impairment Diet and physical activities Evidence for depression or mood disorders  The patient's weight, height, BMI, and visual acuity have been recorded in the chart.  I have made referrals, counseling, and provided education to the patient based on review of the above and I have provided the patient with a written personalized care plan for preventive services.    Tanielle Emigh DAVID, MD   10/23/2014

## 2014-10-23 NOTE — Patient Instructions (Signed)

## 2014-10-24 LAB — HEMOGLOBIN A1C
Hgb A1c MFr Bld: 6.7 % — ABNORMAL HIGH (ref <5.7)
Mean Plasma Glucose: 146 mg/dL — ABNORMAL HIGH (ref <117)

## 2014-10-24 LAB — CBC WITH DIFFERENTIAL/PLATELET
BASOS PCT: 1 % (ref 0–1)
Basophils Absolute: 0.1 10*3/uL (ref 0.0–0.1)
EOS ABS: 0.1 10*3/uL (ref 0.0–0.7)
Eosinophils Relative: 2 % (ref 0–5)
HCT: 32.2 % — ABNORMAL LOW (ref 39.0–52.0)
HEMOGLOBIN: 11.1 g/dL — AB (ref 13.0–17.0)
Lymphocytes Relative: 28 % (ref 12–46)
Lymphs Abs: 1.5 10*3/uL (ref 0.7–4.0)
MCH: 33.1 pg (ref 26.0–34.0)
MCHC: 34.5 g/dL (ref 30.0–36.0)
MCV: 96.1 fL (ref 78.0–100.0)
MPV: 9 fL — AB (ref 9.4–12.4)
Monocytes Absolute: 0.6 10*3/uL (ref 0.1–1.0)
Monocytes Relative: 11 % (ref 3–12)
NEUTROS ABS: 3 10*3/uL (ref 1.7–7.7)
Neutrophils Relative %: 58 % (ref 43–77)
Platelets: 230 10*3/uL (ref 150–400)
RBC: 3.35 MIL/uL — ABNORMAL LOW (ref 4.22–5.81)
RDW: 13.9 % (ref 11.5–15.5)
WBC: 5.2 10*3/uL (ref 4.0–10.5)

## 2014-10-24 LAB — LIPID PANEL
CHOL/HDL RATIO: 3.5 ratio
CHOLESTEROL: 145 mg/dL (ref 0–200)
HDL: 41 mg/dL (ref >39)
LDL Cholesterol: 82 mg/dL (ref 0–99)
Triglycerides: 108 mg/dL (ref <150)
VLDL: 22 mg/dL (ref 0–40)

## 2014-10-24 LAB — HEPATIC FUNCTION PANEL
ALT: 19 U/L (ref 0–53)
AST: 20 U/L (ref 0–37)
Albumin: 3.9 g/dL (ref 3.5–5.2)
Alkaline Phosphatase: 49 U/L (ref 39–117)
BILIRUBIN DIRECT: 0.1 mg/dL (ref 0.0–0.3)
Indirect Bilirubin: 0.5 mg/dL (ref 0.2–1.2)
Total Bilirubin: 0.6 mg/dL (ref 0.2–1.2)
Total Protein: 6.2 g/dL (ref 6.0–8.3)

## 2014-10-24 LAB — TSH: TSH: 0.825 u[IU]/mL (ref 0.350–4.500)

## 2014-10-24 LAB — BASIC METABOLIC PANEL WITH GFR
BUN: 31 mg/dL — ABNORMAL HIGH (ref 6–23)
CALCIUM: 8.8 mg/dL (ref 8.4–10.5)
CO2: 26 mEq/L (ref 19–32)
Chloride: 106 mEq/L (ref 96–112)
Creat: 1.71 mg/dL — ABNORMAL HIGH (ref 0.50–1.35)
GFR, EST AFRICAN AMERICAN: 40 mL/min — AB
GFR, Est Non African American: 35 mL/min — ABNORMAL LOW
Glucose, Bld: 120 mg/dL — ABNORMAL HIGH (ref 70–99)
POTASSIUM: 4.4 meq/L (ref 3.5–5.3)
SODIUM: 140 meq/L (ref 135–145)

## 2014-10-24 LAB — MAGNESIUM: MAGNESIUM: 1.8 mg/dL (ref 1.5–2.5)

## 2014-10-24 LAB — INSULIN, FASTING: Insulin fasting, serum: 39.6 u[IU]/mL — ABNORMAL HIGH (ref 2.0–19.6)

## 2014-10-24 LAB — VITAMIN D 25 HYDROXY (VIT D DEFICIENCY, FRACTURES): VIT D 25 HYDROXY: 69 ng/mL (ref 30–100)

## 2014-10-30 ENCOUNTER — Other Ambulatory Visit: Payer: Self-pay

## 2014-10-30 MED ORDER — LEVOTHYROXINE SODIUM 88 MCG PO TABS: 88.0000 ug | ORAL_TABLET | Freq: Every day | ORAL | Status: DC

## 2014-10-30 NOTE — Telephone Encounter (Signed)
Called patient to verify levothyroxine, per patient he was on 75 now 88 mcg [facility]

## 2014-11-28 ENCOUNTER — Encounter: Payer: Self-pay | Admitting: Cardiovascular Disease

## 2014-11-28 ENCOUNTER — Encounter (INDEPENDENT_AMBULATORY_CARE_PROVIDER_SITE_OTHER): Payer: Medicare Other | Admitting: Cardiovascular Disease

## 2014-11-28 ENCOUNTER — Ambulatory Visit (INDEPENDENT_AMBULATORY_CARE_PROVIDER_SITE_OTHER): Payer: Medicare Other | Admitting: *Deleted

## 2014-11-28 ENCOUNTER — Other Ambulatory Visit: Payer: Self-pay | Admitting: Cardiovascular Disease

## 2014-11-28 DIAGNOSIS — I4891 Unspecified atrial fibrillation: Secondary | ICD-10-CM

## 2014-11-28 DIAGNOSIS — Z8679 Personal history of other diseases of the circulatory system: Secondary | ICD-10-CM

## 2014-11-28 LAB — POCT INR: INR: 1.6

## 2014-11-28 NOTE — Addendum Note (Signed)
Addended by: Wendall StadeNISHAN, Jakaleb Payer C on: 11/28/2014 11:40 AM   Modules accepted: Level of Service

## 2014-11-28 NOTE — Progress Notes (Addendum)
Patient ID: Adam [name], male   DOB: 03/24/1923, 81 y.o.   MRN: [id number]   Cardiology Office Note    Date:  11/28/2014   ID:  Adam [name], DOB 11/23/1922, MRN [id number]  PCP:  Nadean CorwinMCKEOWN,[name] DAVID, MD  Cardiologist:  Dr. Charlton HawsPeter Treshun Wold      History of Present Illness: Adam [name] is a 81 y.o. male with a history of CAD status post multiple PCI procedures in the past, angioplasty to the RCA in 1995, paroxysmal atrial fibrillation (failed sotalol and started on amiodarone in 2001), HTN, HL, CKD.  Last seen by me 11/2013.  Patient recently saw his PCP had concern for PVD  . He saw physical therapy and his leg pain worsened. It was more his right leg than his left. He changed the positioning of his wallet and this seems to have improved some of his symptoms. He denies chest pain. He remains quite active. He plays golf twice a week. He denies significant dyspnea. He denies orthopnea, PND or edema. He denies syncope or dizziness.  Studies:  - LHC (08/1999):  Report not available- reported PDA 90%; medical therapy planned  - abdominal US (03/2011): No AAA  F/U 05/07/14 LE arterial duplex was normal with no evidence of PVD   Recent Labs: 10/23/2014: ALT 19; Creatinine 1.71*; HDL Cholesterol by NMR 41; Hemoglobin 11.1*; LDL (calc) 82; Potassium 4.4; TSH 0.825  Wt Readings from Last 3 Encounters:  10/23/14 70.035 kg (154 lb 6.4 oz)  05/10/14 71.396 kg (157 lb 6.4 oz)  05/01/14 71.578 kg (157 lb 12.8 oz)     Past Medical History  Diagnosis Date  . Atrial fibrillation   . SINUS BRADYCARDIA   . HYPERTENSION   . CORONARY ARTERY DISEASE   . VITAMIN B12 DEFICIENCY   . NEPHROLITHIASIS, HX OF   . MURMUR   . Leg pain     a. ABI (05/2014):  ABIs falsely elevated due to calcification; TBIs ok; dopplers without significant stenosis    Current Outpatient Prescriptions  Medication Sig Dispense Refill  . amiodarone (PACERONE) 200 MG tablet Take 100 mg by mouth daily.     Marland Kitchen.  amLODipine (NORVASC) 5 MG tablet Take 5 mg by mouth daily. Take 1/2 tablet daily.    Marland Kitchen. aspirin 81 MG tablet Take 81 mg by mouth daily.      . Cholecalciferol (VITAMIN D-3) 1000 UNITS CAPS Take 4,000 Units by mouth daily.    Marland Kitchen. levothyroxine (SYNTHROID, LEVOTHROID) 88 MCG tablet Take 1 tablet (88 mcg total) by mouth daily before breakfast. 90 tablet 3  . omeprazole (PRILOSEC) 20 MG capsule Take 1 capsule (20 mg total) by mouth 2 (two) times daily. 60 capsule 90  . quinapril (ACCUPRIL) 40 MG tablet Take 1 tablet (40 mg total) by mouth daily. For BP, heart and kidneys 90 tablet 2  . rosuvastatin (CRESTOR) 10 MG tablet Take 5 mg by mouth daily.    Marland Kitchen. warfarin (COUMADIN) 2.5 MG tablet Take 1 tablet (2.5 mg total) by mouth as directed. 100 tablet 1   No current facility-administered medications for this visit.    Allergies:   Review of patient's allergies indicates no known allergies.   Social History:  The patient  reports that he has never smoked. He does not have any smokeless tobacco history on file. He reports that he does not drink alcohol or use illicit drugs.   Family History:  The patient's family history includes Colon polyps in an other family member;  Heart disease in an other family member.   ROS:  Please see the history of present illness.      All other systems reviewed and negative.   PHYSICAL EXAM: VS:  There were no vitals taken for this visit. Well nourished, well developed, in no acute distress HEENT: normal Neck: no JVD Cardiac:  normal S1, S2; RRR; 1/6 systolic murmurat the RUSB Lungs:  clear to auscultation bilaterally, no wheezing, rhonchi or rales Abd: soft, nontender, no hepatomegaly Ext: no edema Vascular: Popliteal, PT, DP 2+ on the right; trace to 1+ on the left Skin: warm and dry Neuro:  CNs 2-12 intact, no focal abnormalities noted  EKG:  Sinus bradycardia, HR 59, LAD, first degree AV block (PR 214 ms), nonspecific ST-T wave changes, no significant change when  compared to prior tracing, QTc 431     ASSESSMENT AND PLAN:  1. Pain of lower extremity, unspecified laterality:  His symptoms are somewhat atypical. Normal LE arterial duplex 7/15   2. CORONARY ARTERY DISEASE:  Continue aspirin, statin. No angina. 3. Paroxysmal atrial fibrillation:  Maintaining NSR. Continue Coumadin, amiodarone.  Recent LFTs and TSH okay.  12/15 LFT thyroid normal  4. Hypertension:  Controlled. 5. Hyperlipidemia:  Recent LDL optimal. Continue statin.  6. Disposition: Followup with me 6 months     Charlton Haws

## 2014-11-29 ENCOUNTER — Ambulatory Visit: Payer: Medicare Other | Admitting: Cardiovascular Disease

## 2014-12-01 ENCOUNTER — Other Ambulatory Visit: Payer: Self-pay | Admitting: Cardiovascular Disease

## 2014-12-04 ENCOUNTER — Other Ambulatory Visit: Payer: Self-pay | Admitting: Cardiovascular Disease

## 2014-12-14 ENCOUNTER — Ambulatory Visit (INDEPENDENT_AMBULATORY_CARE_PROVIDER_SITE_OTHER): Payer: Medicare Other | Admitting: *Deleted

## 2014-12-14 DIAGNOSIS — Z8679 Personal history of other diseases of the circulatory system: Secondary | ICD-10-CM

## 2014-12-14 DIAGNOSIS — I4891 Unspecified atrial fibrillation: Secondary | ICD-10-CM

## 2014-12-14 LAB — POCT INR: INR: 2.3

## 2014-12-28 ENCOUNTER — Ambulatory Visit (INDEPENDENT_AMBULATORY_CARE_PROVIDER_SITE_OTHER): Payer: Medicare Other | Admitting: Pharmacist

## 2014-12-28 DIAGNOSIS — Z8679 Personal history of other diseases of the circulatory system: Secondary | ICD-10-CM

## 2014-12-28 DIAGNOSIS — I4891 Unspecified atrial fibrillation: Secondary | ICD-10-CM

## 2014-12-28 LAB — POCT INR: INR: 2.2

## 2015-01-25 ENCOUNTER — Ambulatory Visit (INDEPENDENT_AMBULATORY_CARE_PROVIDER_SITE_OTHER): Payer: Medicare Other | Admitting: Pharmacist

## 2015-01-25 DIAGNOSIS — I4891 Unspecified atrial fibrillation: Secondary | ICD-10-CM | POA: Diagnosis not present

## 2015-01-25 DIAGNOSIS — Z8679 Personal history of other diseases of the circulatory system: Secondary | ICD-10-CM | POA: Diagnosis not present

## 2015-01-25 LAB — POCT INR: INR: 2.8

## 2015-01-29 ENCOUNTER — Ambulatory Visit (INDEPENDENT_AMBULATORY_CARE_PROVIDER_SITE_OTHER): Payer: Medicare Other | Admitting: Physician Assistant

## 2015-01-29 ENCOUNTER — Encounter: Payer: Self-pay | Admitting: Physician Assistant

## 2015-01-29 ENCOUNTER — Ambulatory Visit: Payer: Self-pay | Admitting: Physician Assistant

## 2015-01-29 VITALS — BP 120/68 | HR 60 | Temp 97.9°F | Resp 16 | Ht 68.5 in | Wt 157.0 lb

## 2015-01-29 DIAGNOSIS — I2510 Atherosclerotic heart disease of native coronary artery without angina pectoris: Secondary | ICD-10-CM

## 2015-01-29 DIAGNOSIS — E538 Deficiency of other specified B group vitamins: Secondary | ICD-10-CM

## 2015-01-29 DIAGNOSIS — E559 Vitamin D deficiency, unspecified: Secondary | ICD-10-CM

## 2015-01-29 DIAGNOSIS — I10 Essential (primary) hypertension: Secondary | ICD-10-CM

## 2015-01-29 DIAGNOSIS — Z79899 Other long term (current) drug therapy: Secondary | ICD-10-CM

## 2015-01-29 DIAGNOSIS — I4891 Unspecified atrial fibrillation: Secondary | ICD-10-CM

## 2015-01-29 DIAGNOSIS — N183 Chronic kidney disease, stage 3 unspecified: Secondary | ICD-10-CM

## 2015-01-29 DIAGNOSIS — E782 Mixed hyperlipidemia: Secondary | ICD-10-CM

## 2015-01-29 DIAGNOSIS — Z87442 Personal history of urinary calculi: Secondary | ICD-10-CM

## 2015-01-29 DIAGNOSIS — K219 Gastro-esophageal reflux disease without esophagitis: Secondary | ICD-10-CM | POA: Insufficient documentation

## 2015-01-29 DIAGNOSIS — N184 Chronic kidney disease, stage 4 (severe): Secondary | ICD-10-CM

## 2015-01-29 DIAGNOSIS — E1122 Type 2 diabetes mellitus with diabetic chronic kidney disease: Secondary | ICD-10-CM

## 2015-01-29 LAB — CBC WITH DIFFERENTIAL/PLATELET
BASOS ABS: 0.1 10*3/uL (ref 0.0–0.1)
Basophils Relative: 1 % (ref 0–1)
EOS ABS: 0.1 10*3/uL (ref 0.0–0.7)
Eosinophils Relative: 2 % (ref 0–5)
HCT: 33.8 % — ABNORMAL LOW (ref 39.0–52.0)
Hemoglobin: 11.4 g/dL — ABNORMAL LOW (ref 13.0–17.0)
LYMPHS PCT: 26 % (ref 12–46)
Lymphs Abs: 1.8 10*3/uL (ref 0.7–4.0)
MCH: 33.8 pg (ref 26.0–34.0)
MCHC: 33.7 g/dL (ref 30.0–36.0)
MCV: 100.3 fL — AB (ref 78.0–100.0)
MONOS PCT: 11 % (ref 3–12)
MPV: 9 fL (ref 8.6–12.4)
Monocytes Absolute: 0.7 10*3/uL (ref 0.1–1.0)
Neutro Abs: 4.1 10*3/uL (ref 1.7–7.7)
Neutrophils Relative %: 60 % (ref 43–77)
Platelets: 214 10*3/uL (ref 150–400)
RBC: 3.37 MIL/uL — AB (ref 4.22–5.81)
RDW: 14 % (ref 11.5–15.5)
WBC: 6.8 10*3/uL (ref 4.0–10.5)

## 2015-01-29 NOTE — Progress Notes (Signed)
Assessment and Plan:  1. Essential hypertension - continue medications, DASH diet, exercise and monitor at home. Call if greater than 130/80.  - CBC with Differential/Platelet - Hepatic function panel - TSH  2. Atherosclerosis of native coronary artery of native heart without angina pectoris Control blood pressure, cholesterol, glucose, increase exercise.  Continue cardio follow up  3. Atrial fibrillation, unspecified Continue coumadin, rate controlled  4. Type 2 diabetes mellitus with diabetic chronic kidney disease Discussed general issues about diabetes pathophysiology and management., Educational material distributed., Suggested low cholesterol diet., Encouraged aerobic exercise., Discussed foot care., Reminded to get yearly retinal exam. - Hemoglobin A1c - Insulin, fasting  5. Hyperlipidemia -continue medications, check lipids, decrease fatty foods, increase activity. - Lipid panel  6. Vitamin D deficiency Continue supplement - Vit D  25 hydroxy (rtn osteoporosis monitoring)  7. Medication management - Magnesium  8. NEPHROLITHIASIS, HX OF Continue to monitor  9. Vitamin B12 deficiency Following with Dr. Juanda Chance, has future B12 set up for april  10. CKD (chronic kidney disease) stage 3, GFR 30-59 ml/min Increase fluids, avoid NSAIDS, monitor sugars, will monitor - BASIC METABOLIC PANEL WITH GFR  11. Gastroesophageal reflux disease without esophagitis Continue PPI/H2 blocker, diet discussed  Continue diet and meds as discussed. Further disposition pending results of labs. Discussed med's effects and SE's.    Over 30 minutes of exam, counseling, chart review, and critical decision making was performed   HPI 81 y.o. male  presents for 3 month follow up on hypertension, cholesterol, diabetes and vitamin D deficiency.   His blood pressure has been controlled at home, today his BP is BP: 120/68 mmHg.  He does workout. He denies chest pain, shortness of breath,  dizziness.  He is on cholesterol medication, crestor  (gets from the [facility] hospital) and denies myalgias. His cholesterol is at goal. The cholesterol was:   Lab Results  Component Value Date   CHOL 145 10/23/2014   HDL 41 10/23/2014   LDLCALC 82 10/23/2014   TRIG 108 10/23/2014   CHOLHDL 3.5 10/23/2014  He has a history of PTCA x 2 in 1998/2000, on coumadin for chronic Afib, follows with Dr. Eden Emms. Played 18 holes yesterday, no CP/SOB/dizziness.  He has had Dm x 2011.  He has been working on diet and exercise for diabetes with diabetic chronic kidney disease stage III, , controlling it with diet he is on bASA, he is on ACE/ARB, and denies  polydipsia, polyuria and visual disturbances. Last A1C was:  Lab Results  Component Value Date   HGBA1C 6.7* 10/23/2014  Patient is on Vitamin D supplement. Lab Results  Component Value Date   VD25OH 69 10/23/2014  He is on thyroid medication. His medication was not changed last visit.   Lab Results  Component Value Date   TSH 0.825 10/23/2014  .    Current Medications:  Current Outpatient Prescriptions on File Prior to Visit  Medication Sig Dispense Refill  . amiodarone (PACERONE) 200 MG tablet Take 100 mg by mouth daily.     Marland Kitchen amLODipine (NORVASC) 5 MG tablet TAKE ONE TABLET BY MOUTH ONCE DAILY 30 tablet 0  . aspirin 81 MG tablet Take 81 mg by mouth daily.      . Cholecalciferol (VITAMIN D-3) 1000 UNITS CAPS Take 4,000 Units by mouth daily.    Marland Kitchen levothyroxine (SYNTHROID, LEVOTHROID) 88 MCG tablet Take 1 tablet (88 mcg total) by mouth daily before breakfast. 90 tablet 3  . omeprazole (PRILOSEC) 20 MG capsule Take 1  capsule (20 mg total) by mouth 2 (two) times daily. 60 capsule 90  . quinapril (ACCUPRIL) 40 MG tablet Take 1 tablet (40 mg total) by mouth daily. For BP, heart and kidneys 90 tablet 2  . rosuvastatin (CRESTOR) 10 MG tablet Take 5 mg by mouth daily.    Marland Kitchen. warfarin (COUMADIN) 2.5 MG tablet Take 1 tablet (2.5 mg total) by mouth as  directed. 100 tablet 1   No current facility-administered medications on file prior to visit.   Medical History:  Past Medical History  Diagnosis Date  . Atrial fibrillation   . SINUS BRADYCARDIA   . HYPERTENSION   . CORONARY ARTERY DISEASE   . VITAMIN B12 DEFICIENCY   . NEPHROLITHIASIS, HX OF   . MURMUR   . Leg pain     a. ABI (05/2014):  ABIs falsely elevated due to calcification; TBIs ok; dopplers without significant stenosis   Allergies: No Known Allergies   Review of Systems:  Review of Systems  Constitutional: Negative.   HENT: Positive for hearing loss (wears hearing aids). Negative for congestion, ear discharge, ear pain, nosebleeds, sore throat and tinnitus.   Eyes: Negative.   Respiratory: Negative.  Negative for stridor.   Cardiovascular: Negative.   Gastrointestinal: Negative.   Genitourinary: Negative.   Musculoskeletal: Negative.   Skin: Negative.   Neurological: Positive for tingling (bilateral feet). Negative for dizziness, tremors, sensory change, speech change, focal weakness, seizures, loss of consciousness and headaches.  Endo/Heme/Allergies: Negative.   Psychiatric/Behavioral: Negative.     Family history- Review and unchanged Social history- Review and unchanged Physical Exam: BP 120/68 mmHg  Pulse 60  Temp(Src) 97.9 F (36.6 C)  Resp 16  Ht 5' 8.5" (1.74 m)  Wt 157 lb (71.215 kg)  BMI 23.52 kg/m2 Wt Readings from Last 3 Encounters:  01/29/15 157 lb (71.215 kg)  11/28/14 156 lb (70.761 kg)  10/23/14 154 lb 6.4 oz (70.035 kg)   General Appearance: Well nourished, in no apparent distress. Eyes: PERRLA, EOMs, conjunctiva no swelling or erythema Sinuses: No Frontal/maxillary tenderness ENT/Mouth: Ext aud canals clear, TMs without erythema, bulging. No erythema, swelling, or exudate on post pharynx.  Tonsils not swollen or erythematous. Hearing decreased even with hearing aids Neck: Supple, thyroid normal.  Respiratory: Respiratory effort  normal, BS equal bilaterally without rales, rhonchi, wheezing or stridor.  Cardio: RRR with no MRGs, distant heart sounds. Brisk peripheral pulses without edema.  Abdomen: Soft, + BS.  Non tender, no guarding, rebound, hernias, masses. Lymphatics: Non tender without lymphadenopathy.  Musculoskeletal: Full ROM, 5/5 strength, Normal gait Skin: Warm, dry without rashes, lesions, ecchymosis.  Neuro: Cranial nerves intact. No cerebellar symptoms.  Psych: Awake and oriented X 3, normal affect, Insight and Judgment appropriate.    Adam [name], Adam Rowlette, PA-C 12:12 PM [facility]

## 2015-01-29 NOTE — Patient Instructions (Signed)
    Bad carbs also include fruit juice, alcohol, and sweet tea. These are empty calories that do not signal to your brain that you are full.   Please remember the good carbs are still carbs which convert into sugar. So please measure them out no more than 1/2-1 cup of rice, oatmeal, pasta, and beans  Veggies are however free foods! Pile them on.   Not all fruit is created equal. Please see the list below, the fruit at the bottom is higher in sugars than the fruit at the top. Please avoid all dried fruits.     Recommendations For Diabetic/Prediabetic Patients:   -  Take medications as prescribed  -  Recommend Dr Joel Fuhrman's book "The End of Diabetes "  And "The End of Dieting"- Can get at  www.Amazon.com and encourage also get the Audio CD book  - AVOID Animal products, ie. Meat - red/white, Poultry and Dairy/especially cheese - Exercise at least 5 times a week for 30 minutes or preferably daily.  - No Smoking - Drink less than 2 drinks a day.  - Monitor your feet for sores - Have yearly Eye Exams - Recommend annual Flu vaccine  - Recommend Pneumovax and Prevnar vaccines - Shingles Vaccine (Zostavax) if over 60 y.o.  Goals:   - BMI less than 24 - Fasting sugar less than 130 or less than 150 if tapering medicines to lose weight  - Systolic BP less than 130  - Diastolic BP less than 80 - Bad LDL Cholesterol less than 70 - Triglycerides less than 150  

## 2015-01-30 LAB — HEPATIC FUNCTION PANEL
ALBUMIN: 4.1 g/dL (ref 3.5–5.2)
ALT: 16 U/L (ref 0–53)
AST: 18 U/L (ref 0–37)
Alkaline Phosphatase: 53 U/L (ref 39–117)
Bilirubin, Direct: 0.1 mg/dL (ref 0.0–0.3)
Indirect Bilirubin: 0.5 mg/dL (ref 0.2–1.2)
Total Bilirubin: 0.6 mg/dL (ref 0.2–1.2)
Total Protein: 6.6 g/dL (ref 6.0–8.3)

## 2015-01-30 LAB — BASIC METABOLIC PANEL WITH GFR
BUN: 33 mg/dL — AB (ref 6–23)
CALCIUM: 9.2 mg/dL (ref 8.4–10.5)
CO2: 25 mEq/L (ref 19–32)
CREATININE: 2.07 mg/dL — AB (ref 0.50–1.35)
Chloride: 103 mEq/L (ref 96–112)
GFR, EST AFRICAN AMERICAN: 32 mL/min — AB
GFR, Est Non African American: 28 mL/min — ABNORMAL LOW
Glucose, Bld: 83 mg/dL (ref 70–99)
Potassium: 5.2 mEq/L (ref 3.5–5.3)
Sodium: 139 mEq/L (ref 135–145)

## 2015-01-30 LAB — HEMOGLOBIN A1C
Hgb A1c MFr Bld: 6.5 % — ABNORMAL HIGH (ref <5.7)
MEAN PLASMA GLUCOSE: 140 mg/dL — AB (ref <117)

## 2015-01-30 LAB — MAGNESIUM: Magnesium: 2.2 mg/dL (ref 1.5–2.5)

## 2015-01-30 LAB — TSH: TSH: 0.492 u[IU]/mL (ref 0.350–4.500)

## 2015-01-30 LAB — LIPID PANEL
CHOL/HDL RATIO: 3.9 ratio
Cholesterol: 155 mg/dL (ref 0–200)
HDL: 40 mg/dL (ref >=40)
LDL CALC: 87 mg/dL (ref 0–99)
Triglycerides: 140 mg/dL (ref <150)
VLDL: 28 mg/dL (ref 0–40)

## 2015-01-30 LAB — INSULIN, FASTING: Insulin fasting, serum: 11.9 u[IU]/mL (ref 2.0–19.6)

## 2015-01-30 LAB — VITAMIN D 25 HYDROXY (VIT D DEFICIENCY, FRACTURES): Vit D, 25-Hydroxy: 59 ng/mL (ref 30–100)

## 2015-02-01 ENCOUNTER — Ambulatory Visit: Payer: Self-pay

## 2015-02-14 ENCOUNTER — Telehealth: Payer: Self-pay | Admitting: *Deleted

## 2015-02-14 NOTE — Telephone Encounter (Signed)
-----   Message from Daphine Deutscheregina N Perry Molla, RN sent at 08/21/2014  8:40 AM EDT ----- Call and remind due for db 12 for db on 02/17/14. [facility]

## 2015-02-14 NOTE — Telephone Encounter (Signed)
Patient's wife given reminder for lab draw.

## 2015-02-15 ENCOUNTER — Other Ambulatory Visit (INDEPENDENT_AMBULATORY_CARE_PROVIDER_SITE_OTHER): Payer: Medicare Other

## 2015-02-15 DIAGNOSIS — E538 Deficiency of other specified B group vitamins: Secondary | ICD-10-CM

## 2015-02-15 LAB — VITAMIN B12: VITAMIN B 12: 332 pg/mL (ref 211–911)

## 2015-02-19 ENCOUNTER — Other Ambulatory Visit: Payer: Self-pay | Admitting: *Deleted

## 2015-02-19 DIAGNOSIS — E538 Deficiency of other specified B group vitamins: Secondary | ICD-10-CM

## 2015-02-22 ENCOUNTER — Ambulatory Visit (INDEPENDENT_AMBULATORY_CARE_PROVIDER_SITE_OTHER): Payer: Medicare Other | Admitting: *Deleted

## 2015-02-22 DIAGNOSIS — I4891 Unspecified atrial fibrillation: Secondary | ICD-10-CM | POA: Diagnosis not present

## 2015-02-22 DIAGNOSIS — Z8679 Personal history of other diseases of the circulatory system: Secondary | ICD-10-CM | POA: Diagnosis not present

## 2015-02-22 LAB — POCT INR: INR: 2.3

## 2015-03-01 ENCOUNTER — Ambulatory Visit (INDEPENDENT_AMBULATORY_CARE_PROVIDER_SITE_OTHER): Payer: Medicare Other

## 2015-03-01 DIAGNOSIS — R6889 Other general symptoms and signs: Secondary | ICD-10-CM

## 2015-03-01 LAB — BASIC METABOLIC PANEL WITH GFR
BUN: 41 mg/dL — AB (ref 6–23)
CO2: 22 mEq/L (ref 19–32)
Calcium: 9.2 mg/dL (ref 8.4–10.5)
Chloride: 106 mEq/L (ref 96–112)
Creat: 1.75 mg/dL — ABNORMAL HIGH (ref 0.50–1.35)
GFR, EST AFRICAN AMERICAN: 39 mL/min — AB
GFR, EST NON AFRICAN AMERICAN: 34 mL/min — AB
Glucose, Bld: 90 mg/dL (ref 70–99)
Potassium: 5.2 mEq/L (ref 3.5–5.3)
Sodium: 140 mEq/L (ref 135–145)

## 2015-03-01 NOTE — Addendum Note (Signed)
Addended by: Ralph LeydenHOLT, Heavenly Christine A on: 03/01/2015 10:40 AM   Modules accepted: Level of Service

## 2015-03-01 NOTE — Progress Notes (Signed)
Patient ID: Adam [name], male   DOB: 06/23/1923, 81 y.o.   MRN: [id number] Patient here today for a 30 day re-check of kidney function.

## 2015-03-28 ENCOUNTER — Ambulatory Visit (INDEPENDENT_AMBULATORY_CARE_PROVIDER_SITE_OTHER): Payer: Medicare Other

## 2015-03-28 DIAGNOSIS — I4891 Unspecified atrial fibrillation: Secondary | ICD-10-CM | POA: Diagnosis not present

## 2015-03-28 DIAGNOSIS — Z8679 Personal history of other diseases of the circulatory system: Secondary | ICD-10-CM | POA: Diagnosis not present

## 2015-03-28 LAB — POCT INR: INR: 2.6

## 2015-04-01 ENCOUNTER — Other Ambulatory Visit: Payer: Self-pay | Admitting: Cardiovascular Disease

## 2015-04-16 ENCOUNTER — Encounter: Payer: Self-pay | Admitting: Internal Medicine

## 2015-05-09 ENCOUNTER — Ambulatory Visit (INDEPENDENT_AMBULATORY_CARE_PROVIDER_SITE_OTHER): Payer: Medicare Other | Admitting: *Deleted

## 2015-05-09 DIAGNOSIS — I4891 Unspecified atrial fibrillation: Secondary | ICD-10-CM | POA: Diagnosis not present

## 2015-05-09 DIAGNOSIS — Z8679 Personal history of other diseases of the circulatory system: Secondary | ICD-10-CM | POA: Diagnosis not present

## 2015-05-09 LAB — POCT INR: INR: 3.2

## 2015-05-28 ENCOUNTER — Encounter: Payer: Self-pay | Admitting: *Deleted

## 2015-05-28 ENCOUNTER — Ambulatory Visit (INDEPENDENT_AMBULATORY_CARE_PROVIDER_SITE_OTHER): Payer: Medicare Other | Admitting: Internal Medicine

## 2015-05-28 VITALS — BP 116/58 | HR 64 | Temp 97.5°F | Resp 16 | Ht 67.75 in | Wt 159.2 lb

## 2015-05-28 DIAGNOSIS — I10 Essential (primary) hypertension: Secondary | ICD-10-CM

## 2015-05-28 DIAGNOSIS — K219 Gastro-esophageal reflux disease without esophagitis: Secondary | ICD-10-CM | POA: Diagnosis not present

## 2015-05-28 DIAGNOSIS — N183 Chronic kidney disease, stage 3 unspecified: Secondary | ICD-10-CM

## 2015-05-28 DIAGNOSIS — I480 Paroxysmal atrial fibrillation: Secondary | ICD-10-CM | POA: Diagnosis not present

## 2015-05-28 DIAGNOSIS — Z79899 Other long term (current) drug therapy: Secondary | ICD-10-CM | POA: Diagnosis not present

## 2015-05-28 DIAGNOSIS — E1129 Type 2 diabetes mellitus with other diabetic kidney complication: Secondary | ICD-10-CM

## 2015-05-28 DIAGNOSIS — I2510 Atherosclerotic heart disease of native coronary artery without angina pectoris: Secondary | ICD-10-CM

## 2015-05-28 DIAGNOSIS — D519 Vitamin B12 deficiency anemia, unspecified: Secondary | ICD-10-CM | POA: Diagnosis not present

## 2015-05-28 DIAGNOSIS — Z6824 Body mass index (BMI) 24.0-24.9, adult: Secondary | ICD-10-CM

## 2015-05-28 DIAGNOSIS — Z0000 Encounter for general adult medical examination without abnormal findings: Secondary | ICD-10-CM | POA: Insufficient documentation

## 2015-05-28 DIAGNOSIS — E039 Hypothyroidism, unspecified: Secondary | ICD-10-CM | POA: Insufficient documentation

## 2015-05-28 DIAGNOSIS — Z125 Encounter for screening for malignant neoplasm of prostate: Secondary | ICD-10-CM | POA: Diagnosis not present

## 2015-05-28 DIAGNOSIS — E782 Mixed hyperlipidemia: Secondary | ICD-10-CM

## 2015-05-28 DIAGNOSIS — Z1212 Encounter for screening for malignant neoplasm of rectum: Secondary | ICD-10-CM

## 2015-05-28 DIAGNOSIS — Z9181 History of falling: Secondary | ICD-10-CM

## 2015-05-28 DIAGNOSIS — E559 Vitamin D deficiency, unspecified: Secondary | ICD-10-CM

## 2015-05-28 DIAGNOSIS — Z1331 Encounter for screening for depression: Secondary | ICD-10-CM

## 2015-05-28 DIAGNOSIS — S81811A Laceration without foreign body, right lower leg, initial encounter: Secondary | ICD-10-CM | POA: Diagnosis not present

## 2015-05-28 NOTE — Patient Instructions (Signed)

## 2015-05-29 LAB — CBC WITH DIFFERENTIAL/PLATELET
Basophils Absolute: 0.1 10*3/uL (ref 0.0–0.1)
Basophils Relative: 1 % (ref 0–1)
EOS PCT: 1 % (ref 0–5)
Eosinophils Absolute: 0.1 10*3/uL (ref 0.0–0.7)
HEMATOCRIT: 27.1 % — AB (ref 39.0–52.0)
Hemoglobin: 9.1 g/dL — ABNORMAL LOW (ref 13.0–17.0)
Lymphocytes Relative: 30 % (ref 12–46)
Lymphs Abs: 2.3 10*3/uL (ref 0.7–4.0)
MCH: 33.3 pg (ref 26.0–34.0)
MCHC: 33.6 g/dL (ref 30.0–36.0)
MCV: 99.3 fL (ref 78.0–100.0)
MONO ABS: 0.8 10*3/uL (ref 0.1–1.0)
MONOS PCT: 10 % (ref 3–12)
MPV: 8.9 fL (ref 8.6–12.4)
Neutro Abs: 4.5 10*3/uL (ref 1.7–7.7)
Neutrophils Relative %: 58 % (ref 43–77)
Platelets: 205 10*3/uL (ref 150–400)
RBC: 2.73 MIL/uL — ABNORMAL LOW (ref 4.22–5.81)
RDW: 14.2 % (ref 11.5–15.5)
WBC: 7.8 10*3/uL (ref 4.0–10.5)

## 2015-05-29 LAB — INSULIN, RANDOM: Insulin: 42.4 u[IU]/mL — ABNORMAL HIGH (ref 2.0–19.6)

## 2015-05-29 LAB — TSH: TSH: 1.483 u[IU]/mL (ref 0.350–4.500)

## 2015-05-29 LAB — BASIC METABOLIC PANEL WITH GFR
BUN: 53 mg/dL — AB (ref 7–25)
CALCIUM: 9.1 mg/dL (ref 8.6–10.3)
CO2: 22 mEq/L (ref 20–31)
Chloride: 107 mEq/L (ref 98–110)
Creat: 2.92 mg/dL — ABNORMAL HIGH (ref 0.70–1.11)
GFR, EST AFRICAN AMERICAN: 21 mL/min — AB (ref >=60)
GFR, Est Non African American: 18 mL/min — ABNORMAL LOW (ref >=60)
Glucose, Bld: 157 mg/dL — ABNORMAL HIGH (ref 65–99)
Potassium: 5.4 mEq/L — ABNORMAL HIGH (ref 3.5–5.3)
SODIUM: 141 meq/L (ref 135–146)

## 2015-05-29 LAB — PSA: PSA: 3.04 ng/mL (ref <=4.00)

## 2015-05-29 LAB — URINALYSIS, MICROSCOPIC ONLY
Bacteria, UA: NONE SEEN [HPF]
Crystals: NONE SEEN [HPF]
RBC / HPF: NONE SEEN RBC/HPF (ref <=2)
WBC UA: NONE SEEN WBC/HPF (ref <=5)
Yeast: NONE SEEN [HPF]

## 2015-05-29 LAB — LIPID PANEL
Cholesterol: 144 mg/dL (ref 125–200)
HDL: 40 mg/dL (ref >=40)
LDL Cholesterol: 63 mg/dL (ref <130)
Total CHOL/HDL Ratio: 3.6 Ratio (ref <=5.0)
Triglycerides: 204 mg/dL — ABNORMAL HIGH (ref <150)
VLDL: 41 mg/dL — ABNORMAL HIGH (ref <30)

## 2015-05-29 LAB — MAGNESIUM: MAGNESIUM: 2 mg/dL (ref 1.5–2.5)

## 2015-05-29 LAB — HEMOGLOBIN A1C
HEMOGLOBIN A1C: 6.6 % — AB (ref <5.7)
Mean Plasma Glucose: 143 mg/dL — ABNORMAL HIGH (ref <117)

## 2015-05-29 LAB — HEPATIC FUNCTION PANEL
ALBUMIN: 3.9 g/dL (ref 3.6–5.1)
ALT: 17 U/L (ref 9–46)
AST: 19 U/L (ref 10–35)
Alkaline Phosphatase: 55 U/L (ref 40–115)
Bilirubin, Direct: 0.1 mg/dL (ref <=0.2)
Indirect Bilirubin: 0.3 mg/dL (ref 0.2–1.2)
Total Bilirubin: 0.4 mg/dL (ref 0.2–1.2)
Total Protein: 6.5 g/dL (ref 6.1–8.1)

## 2015-05-29 LAB — VITAMIN B12: Vitamin B-12: 370 pg/mL (ref 211–911)

## 2015-05-29 LAB — VITAMIN D 25 HYDROXY (VIT D DEFICIENCY, FRACTURES): Vit D, 25-Hydroxy: 56 ng/mL (ref 30–100)

## 2015-05-29 LAB — MICROALBUMIN / CREATININE URINE RATIO
Creatinine, Urine: 237.5 mg/dL
Microalb Creat Ratio: 12.6 mg/g (ref 0.0–30.0)
Microalb, Ur: 3 mg/dL — ABNORMAL HIGH (ref <2.0)
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
## 2015-06-01 ENCOUNTER — Other Ambulatory Visit: Payer: Self-pay | Admitting: Internal Medicine

## 2015-06-01 DIAGNOSIS — D649 Anemia, unspecified: Secondary | ICD-10-CM

## 2015-06-01 DIAGNOSIS — N184 Chronic kidney disease, stage 4 (severe): Secondary | ICD-10-CM
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
## 2015-06-04 ENCOUNTER — Ambulatory Visit (INDEPENDENT_AMBULATORY_CARE_PROVIDER_SITE_OTHER): Payer: Medicare Other | Admitting: Cardiovascular Disease

## 2015-06-04 ENCOUNTER — Encounter: Payer: Self-pay | Admitting: Cardiovascular Disease

## 2015-06-04 VITALS — BP 90/40 | HR 48 | Ht 67.5 in | Wt 160.8 lb

## 2015-06-04 DIAGNOSIS — I10 Essential (primary) hypertension: Secondary | ICD-10-CM | POA: Diagnosis not present

## 2015-06-04 DIAGNOSIS — I480 Paroxysmal atrial fibrillation: Secondary | ICD-10-CM

## 2015-06-04 DIAGNOSIS — I350 Nonrheumatic aortic (valve) stenosis: Secondary | ICD-10-CM

## 2015-06-04 MED ORDER — QUINAPRIL HCL 20 MG PO TABS: 20.0000 mg | ORAL_TABLET | Freq: Every day | ORAL | Status: DC

## 2015-06-04 NOTE — Patient Instructions (Signed)
Medication Instructions:  DECREASE QUINAPRIL TO 20 MG EVERY DAY  STOP  AMLODIPINE  Labwork: NONE  Testing/Procedures: Your physician has requested that you have an echocardiogram. Echocardiography is a painless test that uses sound waves to create images of your heart. It provides your doctor with information about the size and shape of your heart and how well your heart's chambers and valves are working. This procedure takes approximately one hour. There are no restrictions for this procedure.   Follow-Up: Your physician recommends that you schedule a follow-up appointment in: NEXT  AVAILABLE  WITH DR Eden Emms  Any Other Special Instructions Will Be Listed Below (If Applicable).

## 2015-06-05 ENCOUNTER — Encounter: Payer: Self-pay | Admitting: Internal Medicine

## 2015-06-05 ENCOUNTER — Ambulatory Visit (INDEPENDENT_AMBULATORY_CARE_PROVIDER_SITE_OTHER): Payer: Medicare Other | Admitting: Internal Medicine

## 2015-06-05 VITALS — BP 126/66 | HR 64 | Temp 97.0°F | Resp 16 | Ht 67.75 in | Wt 163.4 lb

## 2015-06-05 DIAGNOSIS — S81811D Laceration without foreign body, right lower leg, subsequent encounter: Secondary | ICD-10-CM | POA: Diagnosis not present

## 2015-06-05 NOTE — Progress Notes (Signed)
   Subjective:    Patient ID: Adam [name], male    DOB: 11-10-1922, 81 y.o.   MRN: [id number]  HPI Patient returns for f/u of skin flap laceration of his right shin. He's changing dry dsg's daily and applying Abx Ung.   Medication Sig  . amiodarone (PACERONE) 200 MG tablet Take 100 mg by mouth daily.   Marland Kitchen aspirin 81 MG tablet Take 81 mg by mouth daily.    . Cholecalciferol (VITAMIN D-3) 1000 UNITS CAPS Take 4,000 Units by mouth daily.  Marland Kitchen levothyroxine (SYNTHROID, LEVOTHROID) 88 MCG tablet Take 1 tablet (88 mcg total) by mouth daily before breakfast.  . quinapril (ACCUPRIL) 20 MG tablet Take 1 tablet (20 mg total) by mouth daily. For BP, heart and kidneys  . rosuvastatin (CRESTOR) 10 MG tablet Take 5 mg by mouth daily.  Marland Kitchen warfarin (COUMADIN) 2.5 MG tablet Take 1 tablet (2.5 mg total) by mouth as directed.  Marland Kitchen omeprazole (PRILOSEC) 20 MG capsule Take 1 capsule (20 mg total) by mouth 2 (two) times daily.   No Known Allergies   Past Medical History  Diagnosis Date  . Atrial fibrillation   . SINUS BRADYCARDIA   . HYPERTENSION   . CORONARY ARTERY DISEASE   . VITAMIN B12 DEFICIENCY   . NEPHROLITHIASIS, HX OF   . MURMUR   . Leg pain     a. ABI (05/2014):  ABIs falsely elevated due to calcification; TBIs ok; dopplers without significant stenosis   Past Surgical History  Procedure Laterality Date  . Angioplasty     No Known Allergies.  Review of Systems 10 point systems review negative except as above.    Objective:   Physical Exam  BP 126/66 mmHg  Pulse 64  Temp(Src) 97 F (36.1 C)  Resp 16  Ht 5' 7.75" (1.721 m)  Wt 163 lb 6.4 oz (74.118 kg)  BMI 25.02 kg/m2  HEENT - Eac's patent. TM's Nl. EOM's full. PERRLA. NasoOroPharynx clear. Neck - supple. Nl Thyroid. Carotids 2+ & No bruits, nodes, JVD Chest - Clear equal BS w/o Rales, rhonchi, wheezes. Cor - Nl HS. RRR w/o sig MGR. PP 1(+). No edema. Abd - No palpable organomegaly, masses or tenderness. BS nl. MS- FROM w/o  deformities. Muscle power, tone and bulk Nl. Gait Nl. Neuro - No obvious Cr N abnormalities. Sensory, motor and Cerebellar functions appear Nl w/o focal abnormalities. Psyche - Mental status normal & appropriate.  No delusions, ideations or obvious mood abnormalities.  Skin - 2" x 3" wound base of rt mid shin appears clean & granulating with a serous drainage. No signs or purulence, cellulitis or lymphangitis.  Wound is rinsed with H2O2 and painted with Betadine and a 4" x 6" tegoderm is applied.     Assessment & Plan:   1. Laceration of leg, right, subsequent encounter  - recc continue wound care same.

## 2015-06-11 ENCOUNTER — Ambulatory Visit (HOSPITAL_COMMUNITY): Payer: Medicare Other | Attending: Cardiovascular Disease

## 2015-06-11 DIAGNOSIS — R001 Bradycardia, unspecified: Secondary | ICD-10-CM | POA: Insufficient documentation

## 2015-06-11 DIAGNOSIS — E785 Hyperlipidemia, unspecified: Secondary | ICD-10-CM | POA: Insufficient documentation

## 2015-06-11 DIAGNOSIS — I083 Combined rheumatic disorders of mitral, aortic and tricuspid valves: Secondary | ICD-10-CM | POA: Insufficient documentation

## 2015-06-11 DIAGNOSIS — I4891 Unspecified atrial fibrillation: Secondary | ICD-10-CM | POA: Insufficient documentation

## 2015-06-11 DIAGNOSIS — I10 Essential (primary) hypertension: Secondary | ICD-10-CM | POA: Insufficient documentation

## 2015-06-11 DIAGNOSIS — I2510 Atherosclerotic heart disease of native coronary artery without angina pectoris: Secondary | ICD-10-CM | POA: Insufficient documentation

## 2015-06-13 ENCOUNTER — Ambulatory Visit (INDEPENDENT_AMBULATORY_CARE_PROVIDER_SITE_OTHER): Payer: Medicare Other

## 2015-06-13 ENCOUNTER — Other Ambulatory Visit: Payer: Self-pay

## 2015-06-13 ENCOUNTER — Ambulatory Visit (HOSPITAL_BASED_OUTPATIENT_CLINIC_OR_DEPARTMENT_OTHER): Payer: Medicare Other

## 2015-06-13 DIAGNOSIS — I10 Essential (primary) hypertension: Secondary | ICD-10-CM | POA: Diagnosis not present

## 2015-06-13 DIAGNOSIS — R001 Bradycardia, unspecified: Secondary | ICD-10-CM | POA: Diagnosis not present

## 2015-06-13 DIAGNOSIS — I350 Nonrheumatic aortic (valve) stenosis: Secondary | ICD-10-CM

## 2015-06-13 DIAGNOSIS — I083 Combined rheumatic disorders of mitral, aortic and tricuspid valves: Secondary | ICD-10-CM | POA: Diagnosis not present

## 2015-06-13 DIAGNOSIS — I2510 Atherosclerotic heart disease of native coronary artery without angina pectoris: Secondary | ICD-10-CM | POA: Diagnosis not present

## 2015-06-13 DIAGNOSIS — I4891 Unspecified atrial fibrillation: Secondary | ICD-10-CM

## 2015-06-13 DIAGNOSIS — Z8679 Personal history of other diseases of the circulatory system: Secondary | ICD-10-CM | POA: Diagnosis not present

## 2015-06-13 DIAGNOSIS — E785 Hyperlipidemia, unspecified: Secondary | ICD-10-CM | POA: Diagnosis not present

## 2015-06-13 LAB — POCT INR: INR: 3.1

## 2015-06-17 ENCOUNTER — Telehealth: Payer: Self-pay | Admitting: Cardiovascular Disease

## 2015-06-17 NOTE — Telephone Encounter (Signed)
PT AWARE OF ECHO RESULTS./CY 

## 2015-06-17 NOTE — Telephone Encounter (Signed)
New message ° ° ° ° °Pt returning your call regarding results ° °

## 2015-07-04 ENCOUNTER — Ambulatory Visit (INDEPENDENT_AMBULATORY_CARE_PROVIDER_SITE_OTHER): Payer: Medicare Other

## 2015-07-04 DIAGNOSIS — Z8679 Personal history of other diseases of the circulatory system: Secondary | ICD-10-CM

## 2015-07-04 DIAGNOSIS — I4891 Unspecified atrial fibrillation: Secondary | ICD-10-CM

## 2015-07-04 LAB — POCT INR: INR: 2.8

## 2015-07-24 ENCOUNTER — Other Ambulatory Visit: Payer: Self-pay | Admitting: Cardiovascular Disease

## 2015-07-24 DIAGNOSIS — I10 Essential (primary) hypertension: Secondary | ICD-10-CM

## 2015-07-24 MED ORDER — QUINAPRIL HCL 20 MG PO TABS: 20.0000 mg | ORAL_TABLET | Freq: Every day | ORAL | Status: DC

## 2015-07-25 ENCOUNTER — Ambulatory Visit (INDEPENDENT_AMBULATORY_CARE_PROVIDER_SITE_OTHER): Payer: Medicare Other | Admitting: *Deleted

## 2015-07-25 DIAGNOSIS — Z23 Encounter for immunization: Secondary | ICD-10-CM | POA: Diagnosis not present

## 2015-07-29 ENCOUNTER — Encounter: Payer: Self-pay | Admitting: *Deleted

## 2015-07-29 NOTE — Progress Notes (Signed)
Patient ID: Adam [name], male   DOB: 1923-02-24, 81 y.o.   MRN: [id number]   Cardiology Office Note    Date:  07/29/2015   ID:  Adam [name], DOB 03/09/1923, MRN [id number]  PCP:  Nadean Corwin, MD  Cardiologist:  Dr. Charlton Haws      History of Present Illness: Adam [name] is a 81 y.o. male with a history of CAD status post multiple PCI procedures in the past, angioplasty to the RCA in 1995, paroxysmal atrial fibrillation (failed sotalol and started on amiodarone in 2001), HTN, HL, CKD.  Last seen by me 11/2013.  Saw PA last year for ? Of claudication   F/U ABI's reviewed:  05/07/14  Non compressable but normal TBI and normal arterial duplex  Studies:  - LHC (08/1999):  Report not available- reported PDA 90%; medical therapy planned  - abdominal US (03/2011): No AAA  BP low in office  August and norvasc stopped quinapril decreased.   Improved now   Echo 06/13/15 reviewed normal EF moderate AS  Study Conclusions  - Left ventricle: The cavity size was normal. There was mild concentric hypertrophy. Systolic function was normal. The estimated ejection fraction was in the range of 60% to 65%. Wall motion was normal; there were no regional wall motion abnormalities. Doppler parameters are consistent with abnormal left ventricular relaxation (grade 1 diastolic dysfunction). The E/e&' ratio is >15, suggesting elevated LV filling pressure. - Aortic valve: Mildly calcified leaflets. Moderate aortic stenosis. There was trivial regurgitation. Mean gradient (S): 15 mm Hg. Peak gradient (S): 24 mm Hg. Valve area (VTI): 1.26 cm^2. Valve area (Vmax): 1.24 cm^2. Valve area (Vmean): 1.2 cm^2. - Mitral valve: Calcified annulus. Mildly thickened leaflets . There was mild regurgitation. - Left atrium: The atrium was normal in size. - Right atrium: The atrium was mildly dilated. - Tricuspid valve: There was mild regurgitation. - Pulmonary arteries: PA peak  pressure: 28 mm Hg (S). - Inferior vena cava: The vessel was normal in size. The respirophasic diameter changes were in the normal range (>= 50%), consistent with normal central venous pressure.     Recent Labs: 05/28/2015: ALT 17; Creat 2.92*; HDL 40; Hemoglobin 9.1*; LDL Cholesterol 63; Potassium 5.4*; TSH 1.483  Wt Readings from Last 3 Encounters:  06/05/15 74.118 kg (163 lb 6.4 oz)  06/04/15 72.938 kg (160 lb 12.8 oz)  05/28/15 72.213 kg (159 lb 3.2 oz)     Past Medical History  Diagnosis Date  . Atrial fibrillation   . SINUS BRADYCARDIA   . HYPERTENSION   . CORONARY ARTERY DISEASE   . VITAMIN B12 DEFICIENCY   . NEPHROLITHIASIS, HX OF   . MURMUR   . Leg pain     a. ABI (05/2014):  ABIs falsely elevated due to calcification; TBIs ok; dopplers without significant stenosis    Current Outpatient Prescriptions  Medication Sig Dispense Refill  . amiodarone (PACERONE) 200 MG tablet Take 100 mg by mouth daily.     Marland Kitchen amLODipine (NORVASC) 5 MG tablet Take 5 mg by mouth daily.   0  . aspirin 81 MG tablet Take 81 mg by mouth daily.      . Cholecalciferol (VITAMIN D-3) 1000 UNITS CAPS Take 4,000 Units by mouth daily.    Marland Kitchen levothyroxine (SYNTHROID, LEVOTHROID) 88 MCG tablet Take 1 tablet (88 mcg total) by mouth daily before breakfast. 90 tablet 3  . omeprazole (PRILOSEC) 20 MG capsule Take 1 capsule (20 mg total) by mouth 2 (two) times  daily. 60 capsule 90  . quinapril (ACCUPRIL) 20 MG tablet Take 1 tablet (20 mg total) by mouth daily. 90 tablet 0  . rosuvastatin (CRESTOR) 10 MG tablet Take 5 mg by mouth daily.    Marland Kitchen warfarin (COUMADIN) 2.5 MG tablet Take 1 tablet (2.5 mg total) by mouth as directed. 100 tablet 1   No current facility-administered medications for this visit.    Allergies:   Review of patient's allergies indicates no known allergies.   Social History:  The patient  reports that he has never smoked. He does not have any smokeless tobacco history on file. He  reports that he does not drink alcohol or use illicit drugs.   Family History:  The patient's family history includes Colon polyps in an other family member; Heart disease in an other family member.   ROS:  Please see the history of present illness.      All other systems reviewed and negative.   PHYSICAL EXAM: VS:  There were no vitals taken for this visit. Well nourished, well developed, in no acute distress HEENT: normal Neck: no JVD Cardiac:  normal S1, S2; RRR; 2/6 systolic murmurat the RUSB Lungs:  clear to auscultation bilaterally, no wheezing, rhonchi or rales Abd: soft, nontender, no hepatomegaly Ext: no edema Vascular: Popliteal, PT, DP 2+ on the right; trace to 1+ on the left Skin: warm and dryechymosis RLE from accident  Neuro:  CNs 2-12 intact, no focal abnormalities noted  EKG:  05/28/15   Sinus bradycardia, HR 59, LAD, first degree AV block (PR 214 ms), nonspecific ST-T wave changes, no significant change when compared to prior tracing, QTc 431     ASSESSMENT AND PLAN:  1. Pain of lower extremity, unspecified laterality:  His symptoms are somewhat atypical. ABI's normal . 2. CORONARY ARTERY DISEASE:  Continue aspirin, statin. No angina. 3. Paroxysmal atrial fibrillation:  Maintaining NSR. Continue Coumadin, amiodarone.  Recent LFTs and TSH okay. 4. Hypertension:  BP low stop norvasc and cut quinipril to 20 mg f/u next available  5. Hyperlipidemia:  Recent LDL optimal. Continue statin.  6. Murmur:  Moderate AS f/u echo in 12/2015   7. Disposition: Followup with me  2/17 with echo     Charlton Haws

## 2015-07-30 ENCOUNTER — Ambulatory Visit (INDEPENDENT_AMBULATORY_CARE_PROVIDER_SITE_OTHER): Payer: Medicare Other | Admitting: Cardiovascular Disease

## 2015-07-30 ENCOUNTER — Ambulatory Visit (INDEPENDENT_AMBULATORY_CARE_PROVIDER_SITE_OTHER): Payer: Medicare Other

## 2015-07-30 ENCOUNTER — Encounter: Payer: Self-pay | Admitting: Cardiovascular Disease

## 2015-07-30 VITALS — BP 114/50 | HR 67 | Ht 67.0 in | Wt 160.0 lb

## 2015-07-30 DIAGNOSIS — I350 Nonrheumatic aortic (valve) stenosis: Secondary | ICD-10-CM | POA: Diagnosis not present

## 2015-07-30 DIAGNOSIS — I4891 Unspecified atrial fibrillation: Secondary | ICD-10-CM

## 2015-07-30 DIAGNOSIS — Z8679 Personal history of other diseases of the circulatory system: Secondary | ICD-10-CM | POA: Diagnosis not present

## 2015-07-30 DIAGNOSIS — I10 Essential (primary) hypertension: Secondary | ICD-10-CM

## 2015-07-30 LAB — POCT INR: INR: 2.8

## 2015-07-30 NOTE — Patient Instructions (Signed)
Medication Instructions:  Your physician recommends that you continue on your current medications as directed. Please refer to the Current Medication list given to you today.   Labwork: NONE  Testing/Procedures: Your physician has requested that you have an echocardiogram. Echocardiography is a painless test that uses sound waves to create images of your heart. It provides your doctor with information about the size and shape of your heart and how well your heart's chambers and valves are working. This procedure takes approximately one hour. There are no restrictions for this procedure.  DUE IN  FEB  Follow-Up: Your physician wants you to follow-up in:FEB WITH  DR Eden Emms  ECHO SAME DAY  You will receive a reminder letter in the mail two months in advance. If you don't receive a letter, please call our office to schedule the follow-up appointment.   Any Other Special Instructions Will Be Listed Below (If Applicable).

## 2015-08-07 ENCOUNTER — Other Ambulatory Visit: Payer: Self-pay | Admitting: *Deleted

## 2015-08-07 DIAGNOSIS — Z1212 Encounter for screening for malignant neoplasm of rectum: Secondary | ICD-10-CM

## 2015-08-07 LAB — POC HEMOCCULT BLD/STL (HOME/3-CARD/SCREEN)
Card #2 Fecal Occult Blod, POC: NEGATIVE
Card #3 Fecal Occult Blood, POC: NEGATIVE
Fecal Occult Blood, POC: NEGATIVE

## 2015-08-27 ENCOUNTER — Ambulatory Visit (INDEPENDENT_AMBULATORY_CARE_PROVIDER_SITE_OTHER): Payer: Medicare Other | Admitting: *Deleted

## 2015-08-27 DIAGNOSIS — I4891 Unspecified atrial fibrillation: Secondary | ICD-10-CM

## 2015-08-27 DIAGNOSIS — Z8679 Personal history of other diseases of the circulatory system: Secondary | ICD-10-CM

## 2015-08-27 LAB — POCT INR: INR: 3.5

## 2015-09-02 ENCOUNTER — Ambulatory Visit: Payer: Self-pay | Admitting: Internal Medicine

## 2015-09-03 ENCOUNTER — Ambulatory Visit: Payer: Self-pay | Admitting: Internal Medicine

## 2015-09-17 ENCOUNTER — Ambulatory Visit (INDEPENDENT_AMBULATORY_CARE_PROVIDER_SITE_OTHER): Payer: Medicare Other | Admitting: Pharmacist

## 2015-09-17 DIAGNOSIS — I4891 Unspecified atrial fibrillation: Secondary | ICD-10-CM

## 2015-09-17 DIAGNOSIS — Z8679 Personal history of other diseases of the circulatory system: Secondary | ICD-10-CM | POA: Diagnosis not present

## 2015-09-17 LAB — POCT INR: INR: 2.4

## 2015-10-03 ENCOUNTER — Ambulatory Visit (INDEPENDENT_AMBULATORY_CARE_PROVIDER_SITE_OTHER): Payer: Medicare Other | Admitting: *Deleted

## 2015-10-03 DIAGNOSIS — I4891 Unspecified atrial fibrillation: Secondary | ICD-10-CM

## 2015-10-03 DIAGNOSIS — Z8679 Personal history of other diseases of the circulatory system: Secondary | ICD-10-CM | POA: Diagnosis not present

## 2015-10-03 LAB — POCT INR: INR: 2.5

## 2015-10-07 ENCOUNTER — Other Ambulatory Visit: Payer: Self-pay | Admitting: Cardiovascular Disease

## 2015-10-24 ENCOUNTER — Other Ambulatory Visit: Payer: Self-pay | Admitting: Cardiovascular Disease

## 2015-10-31 ENCOUNTER — Ambulatory Visit (INDEPENDENT_AMBULATORY_CARE_PROVIDER_SITE_OTHER): Payer: Medicare Other | Admitting: *Deleted

## 2015-10-31 DIAGNOSIS — I4891 Unspecified atrial fibrillation: Secondary | ICD-10-CM | POA: Diagnosis not present

## 2015-10-31 DIAGNOSIS — Z8679 Personal history of other diseases of the circulatory system: Secondary | ICD-10-CM

## 2015-10-31 LAB — POCT INR: INR: 2.5

## 2015-11-26 ENCOUNTER — Telehealth: Payer: Self-pay | Admitting: Cardiovascular Disease

## 2015-11-26 ENCOUNTER — Encounter: Payer: Self-pay | Admitting: Cardiovascular Disease

## 2015-11-26 NOTE — Telephone Encounter (Signed)
New Message ° °This message is to inform you that we have made 3 consecutive attempts to contact the patient since 10/03/2015 We have also mailed a letter to the patient to inform them to call in and schedule. Although we were unsuccessful in these attempts we wanted you to be aware of our efforts. Will remove the patient from our recall list at this time  ° ° °[place] °[facility] °

## 2015-11-28 ENCOUNTER — Ambulatory Visit (INDEPENDENT_AMBULATORY_CARE_PROVIDER_SITE_OTHER): Payer: Medicare Other | Admitting: *Deleted

## 2015-11-28 DIAGNOSIS — Z8679 Personal history of other diseases of the circulatory system: Secondary | ICD-10-CM | POA: Diagnosis not present

## 2015-11-28 DIAGNOSIS — I4891 Unspecified atrial fibrillation: Secondary | ICD-10-CM | POA: Diagnosis not present

## 2015-11-28 LAB — POCT INR: INR: 2.9

## 2015-12-03 ENCOUNTER — Encounter: Payer: Self-pay | Admitting: Internal Medicine

## 2015-12-03 ENCOUNTER — Ambulatory Visit (INDEPENDENT_AMBULATORY_CARE_PROVIDER_SITE_OTHER): Payer: Medicare Other | Admitting: Internal Medicine

## 2015-12-03 VITALS — BP 150/58 | HR 60 | Temp 97.8°F | Resp 16 | Ht 67.5 in | Wt 160.0 lb

## 2015-12-03 DIAGNOSIS — N183 Chronic kidney disease, stage 3 unspecified: Secondary | ICD-10-CM

## 2015-12-03 DIAGNOSIS — I10 Essential (primary) hypertension: Secondary | ICD-10-CM

## 2015-12-03 DIAGNOSIS — Z0000 Encounter for general adult medical examination without abnormal findings: Secondary | ICD-10-CM

## 2015-12-03 DIAGNOSIS — Z79899 Other long term (current) drug therapy: Secondary | ICD-10-CM

## 2015-12-03 DIAGNOSIS — E782 Mixed hyperlipidemia: Secondary | ICD-10-CM | POA: Diagnosis not present

## 2015-12-03 DIAGNOSIS — K219 Gastro-esophageal reflux disease without esophagitis: Secondary | ICD-10-CM

## 2015-12-03 DIAGNOSIS — T45515A Adverse effect of anticoagulants, initial encounter: Secondary | ICD-10-CM

## 2015-12-03 DIAGNOSIS — E1129 Type 2 diabetes mellitus with other diabetic kidney complication: Secondary | ICD-10-CM | POA: Diagnosis not present

## 2015-12-03 DIAGNOSIS — D6832 Hemorrhagic disorder due to extrinsic circulating anticoagulants: Secondary | ICD-10-CM | POA: Insufficient documentation

## 2015-12-03 DIAGNOSIS — E559 Vitamin D deficiency, unspecified: Secondary | ICD-10-CM | POA: Diagnosis not present

## 2015-12-03 DIAGNOSIS — E538 Deficiency of other specified B group vitamins: Secondary | ICD-10-CM | POA: Diagnosis not present

## 2015-12-03 DIAGNOSIS — I4891 Unspecified atrial fibrillation: Secondary | ICD-10-CM

## 2015-12-03 DIAGNOSIS — R6889 Other general symptoms and signs: Secondary | ICD-10-CM | POA: Diagnosis not present

## 2015-12-03 DIAGNOSIS — E039 Hypothyroidism, unspecified: Secondary | ICD-10-CM

## 2015-12-03 DIAGNOSIS — Z0001 Encounter for general adult medical examination with abnormal findings: Secondary | ICD-10-CM | POA: Diagnosis not present

## 2015-12-03 DIAGNOSIS — Z23 Encounter for immunization: Secondary | ICD-10-CM | POA: Diagnosis not present

## 2015-12-03 DIAGNOSIS — Z87442 Personal history of urinary calculi: Secondary | ICD-10-CM | POA: Diagnosis not present

## 2015-12-03 DIAGNOSIS — Z7901 Long term (current) use of anticoagulants: Secondary | ICD-10-CM | POA: Diagnosis not present

## 2015-12-03 DIAGNOSIS — I2510 Atherosclerotic heart disease of native coronary artery without angina pectoris: Secondary | ICD-10-CM

## 2015-12-03 LAB — HEPATIC FUNCTION PANEL
ALK PHOS: 54 U/L (ref 40–115)
ALT: 16 U/L (ref 9–46)
AST: 18 U/L (ref 10–35)
Albumin: 4 g/dL (ref 3.6–5.1)
BILIRUBIN INDIRECT: 0.4 mg/dL (ref 0.2–1.2)
Bilirubin, Direct: 0.1 mg/dL (ref <=0.2)
TOTAL PROTEIN: 7 g/dL (ref 6.1–8.1)
Total Bilirubin: 0.5 mg/dL (ref 0.2–1.2)

## 2015-12-03 LAB — BASIC METABOLIC PANEL WITH GFR
BUN: 38 mg/dL — ABNORMAL HIGH (ref 7–25)
CALCIUM: 9.1 mg/dL (ref 8.6–10.3)
CHLORIDE: 105 mmol/L (ref 98–110)
CO2: 26 mmol/L (ref 20–31)
Creat: 1.72 mg/dL — ABNORMAL HIGH (ref 0.70–1.11)
GFR, EST NON AFRICAN AMERICAN: 34 mL/min — AB (ref >=60)
GFR, Est African American: 40 mL/min — ABNORMAL LOW (ref >=60)
GLUCOSE: 98 mg/dL (ref 65–99)
Potassium: 5.3 mmol/L (ref 3.5–5.3)
Sodium: 140 mmol/L (ref 135–146)

## 2015-12-03 LAB — CBC WITH DIFFERENTIAL/PLATELET
Basophils Absolute: 0.1 10*3/uL (ref 0.0–0.1)
Basophils Relative: 1 % (ref 0–1)
Eosinophils Absolute: 0.1 10*3/uL (ref 0.0–0.7)
Eosinophils Relative: 2 % (ref 0–5)
HEMATOCRIT: 34.7 % — AB (ref 39.0–52.0)
HEMOGLOBIN: 11.7 g/dL — AB (ref 13.0–17.0)
LYMPHS PCT: 25 % (ref 12–46)
Lymphs Abs: 1.9 10*3/uL (ref 0.7–4.0)
MCH: 33.2 pg (ref 26.0–34.0)
MCHC: 33.7 g/dL (ref 30.0–36.0)
MCV: 98.6 fL (ref 78.0–100.0)
MONO ABS: 0.8 10*3/uL (ref 0.1–1.0)
MONOS PCT: 11 % (ref 3–12)
MPV: 9.2 fL (ref 8.6–12.4)
NEUTROS ABS: 4.5 10*3/uL (ref 1.7–7.7)
Neutrophils Relative %: 61 % (ref 43–77)
Platelets: 208 10*3/uL (ref 150–400)
RBC: 3.52 MIL/uL — AB (ref 4.22–5.81)
RDW: 14.9 % (ref 11.5–15.5)
WBC: 7.4 10*3/uL (ref 4.0–10.5)

## 2015-12-03 LAB — LIPID PANEL
CHOL/HDL RATIO: 3.5 ratio (ref <=5.0)
Cholesterol: 179 mg/dL (ref 125–200)
HDL: 51 mg/dL (ref >=40)
LDL CALC: 104 mg/dL (ref <130)
Triglycerides: 121 mg/dL (ref <150)
VLDL: 24 mg/dL (ref <30)

## 2015-12-03 LAB — TSH: TSH: 2.402 u[IU]/mL (ref 0.350–4.500)

## 2015-12-03 NOTE — Progress Notes (Signed)
Patient ID: Adam [name], male   DOB: 10/12/1923, 82 y.o.   MRN: [id number]  MEDICARE ANNUAL WELLNESS VISIT AND FOLLOW UP Assessment:     1. Essential hypertension -cont meds -monitored by both [place] and Cards -DASH diet -call office if consistently 150/90 or greater  2. Atherosclerosis of native coronary artery of native heart without angina pectoris -monitored by cards  3. Atrial fibrillation, unspecified type (HCC) -cont coumadin -monitored by cards  4. Long term current use of anticoagulant therapy -in desired range -monitored by cards  5. Vitamin B12 deficiency -cont B complex vitamins  6. Gastroesophageal reflux disease, esophagitis presence not specified -well controlled  7. Type 2 diabetes mellitus with other diabetic kidney complication, without long-term current use of insulin (HCC) -managed by diet and exercise - Hemoglobin A1c  8. Hypothyroidism, unspecified hypothyroidism type -cont meds - TSH  9. CKD (chronic kidney disease) stage 3, GFR 30-59 ml/min -cont hydration -monitor with bmet  10. NEPHROLITHIASIS, HX OF -not currently an issue  11. Hyperlipidemia  - Lipid panel  12. Vitamin D deficiency Cont supplement  13. Medication management - CBC with Differential/Platelet - BASIC METABOLIC PANEL WITH GFR - Hepatic function panel  14. Initial Medicare annual wellness visit -due next Jan  15. Need for prophylactic vaccination with tetanus-diphtheria (TD)  - DT Vaccine greater than 7yo IM    Over 30 minutes of exam, counseling, chart review, and critical decision making was performed  Plan:   During the course of the visit the patient was educated and counseled about appropriate screening and preventive services including:    Pneumococcal vaccine   Influenza vaccine  Prevnar 13  Td vaccine  Screening electrocardiogram  Colorectal cancer screening  Diabetes screening  Glaucoma screening  Nutrition counseling    Conditions/risks identified: BMI: Discussed weight loss, diet, and increase physical activity.  Increase physical activity: AHA recommends 150 minutes of physical activity a week.  Medications reviewed Diabetes is at goal, ACE/ARB therapy: Yes. Urinary Incontinence is not an issue: discussed non pharmacology and pharmacology options.  Fall risk: low- discussed PT, home fall assessment, medications.    Subjective:  Adam [name] is a 82 y.o. male who presents for Medicare Annual Wellness Visit and 3 month follow up for HTN, hyperlipidemia, diabetes with chronic kidney disease, and vitamin D Def.  Date of last medicare wellness visit was is 10/2014.  His blood pressure has been controlled at home, today their BP is BP: (!) 150/58 mmHg He does workout. He denies chest pain, shortness of breath, dizziness.  He is on cholesterol medication and denies myalgias. His cholesterol is at goal. The cholesterol last visit was:   Lab Results  Component Value Date   CHOL 144 05/28/2015   HDL 40 05/28/2015   LDLCALC 63 05/28/2015   TRIG 204* 05/28/2015   CHOLHDL 3.6 05/28/2015   He has been working on diet and exercise for prediabetes, and denies foot ulcerations, hyperglycemia, hypoglycemia , increased appetite, nausea, paresthesia of the feet, polydipsia, polyuria, visual disturbances, vomiting and weight loss. Last A1C in the office was:  Lab Results  Component Value Date   HGBA1C 6.6* 05/28/2015   Last GFR NonAA   Lab Results  Component Value Date   GFRNONAA 18* 05/28/2015   AA  Lab Results  Component Value Date   GFRAA 21* 05/28/2015   Patient is on Vitamin D supplement.   Lab Results  Component Value Date   VD25OH 56 05/28/2015  Medication Review: Current Outpatient Prescriptions on File Prior to Visit  Medication Sig Dispense Refill  . amiodarone (PACERONE) 200 MG tablet Take 100 mg by mouth daily.     Marland Kitchen aspirin 81 MG tablet Take 81 mg by mouth daily.      .  Cholecalciferol (VITAMIN D-3) 1000 UNITS CAPS Take 4,000 Units by mouth daily.    Marland Kitchen levothyroxine (SYNTHROID, LEVOTHROID) 88 MCG tablet Take 1 tablet (88 mcg total) by mouth daily before breakfast. 90 tablet 3  . quinapril (ACCUPRIL) 20 MG tablet TAKE ONE TABLET BY MOUTH ONCE DAILY 90 tablet 2  . rosuvastatin (CRESTOR) 10 MG tablet Take 5 mg by mouth daily.    Marland Kitchen warfarin (COUMADIN) 2.5 MG tablet TAKE ONE TABLET BY MOUTH  AS DIRECTED 100 tablet 0   No current facility-administered medications on file prior to visit.    Current Problems (verified) Patient Active Problem List   Diagnosis Date Noted  . Long term current use of anticoagulant therapy 12/03/2015  . Hypothyroidism 05/28/2015  . Initial Medicare annual wellness visit 05/28/2015  . CKD (chronic kidney disease) stage 3, GFR 30-59 ml/min 01/29/2015  . GERD (gastroesophageal reflux disease) 01/29/2015  . Medication management 10/23/2014  . Type II diabetes mellitus with renal manifestations 10/17/2013  . Hyperlipidemia 10/17/2013  . Vitamin D deficiency 10/17/2013  . Atrial fibrillation (HCC) 12/16/2010  . Vitamin B12 deficiency 01/13/2008  . Essential hypertension 01/13/2008  . Coronary atherosclerosis 01/13/2008  . NEPHROLITHIASIS, HX OF 01/13/2008    Screening Tests Immunization History  Administered Date(s) Administered  . Influenza Split 07/06/2013  . Influenza, High Dose Seasonal PF 08/09/2014, 07/25/2015  . Pneumococcal Polysaccharide-23 01/18/2008  . Td 11/02/2004  . Zoster 10/07/2009    Preventative care: Last colonoscopy: 2007  Prior vaccinations: TD or Tdap: today  Influenza: 2016 Pneumococcal:  Prevnar13: Due but currently out of stock Shingles/Zostavax: 2010  Names of Other Physician/Practitioners you currently use: 1. [facility] here for primary care 2. Dr. Elmer Picker and Dr. Maple Hudson, eye doctor, last visit 2016 3. Dr. Mila Palmer, dentist, last visit 2016 Patient Care  Team: Lucky Cowboy, MD as PCP - General (Internal Medicine) Wendall Stade, MD as Consulting Physician (Cardiology) Mateo Flow, MD as Consulting Physician (Ophthalmology)  Past Surgical History  Procedure Laterality Date  . Angioplasty     Family History  Problem Relation Age of Onset  . Colon polyps    . Heart disease     Social History  Substance Use Topics  . Smoking status: Never Smoker   . Smokeless tobacco: None  . Alcohol Use: No    MEDICARE WELLNESS OBJECTIVES: Tobacco use: He does not smoke.  Patient is not a former smoker. If yes, counseling given Alcohol Current alcohol use: none Osteoporosis: hypogonadism, History of fracture in the past year: no Fall risk: Low Risk Hearing: impaired Visual acuity: impaired,  does perform annual eye exam Diet: well balanced Physical activity:   Cardiac risk factors:   Depression/mood screen:   Depression screen [facility] 2/9 05/29/2015  Decreased Interest 0  Down, Depressed, Hopeless 0  PHQ - 2 Score 0    ADLs:  In your present state of health, do you have any difficulty performing the following activities: 05/29/2015  Hearing? Y  Vision? N  Difficulty concentrating or making decisions? N  Walking or climbing stairs? Y  Doing errands, shopping? N     Cognitive Testing  Alert? Yes  Normal Appearance?Yes  Oriented to person? Yes  Place? Yes   Time? Yes  Recall of three objects?  Yes  Can perform simple calculations? Yes  Displays appropriate judgment?Yes  Can read the correct time from a watch face?Yes  EOL planning:     Objective:   Today's Vitals   12/03/15 1154  BP: 150/58  Pulse: 60  Temp: 97.8 F (36.6 C)  TempSrc: Temporal  Resp: 16  Height: 5' 7.5" (1.715 m)  Weight: 160 lb (72.576 kg)   Body mass index is 24.68 kg/(m^2).  General appearance: alert, no distress, WD/WN, male HEENT: normocephalic, sclerae anicteric, TMs pearly, nares patent, no discharge or erythema, pharynx normal Oral cavity:  MMM, no lesions Neck: supple, no lymphadenopathy, no thyromegaly, no masses Heart:  Ireg ireg, normal S1, S2, no murmurs Lungs: CTA bilaterally, no wheezes, rhonchi, or rales Abdomen: +bs, soft, non tender, non distended, no masses, no hepatomegaly, no splenomegaly Musculoskeletal: nontender, no swelling, no obvious deformity Extremities: no edema, no cyanosis, no clubbing Pulses: 2+ symmetric, upper and lower extremities, normal cap refill Neurological: alert, oriented x 3, CN2-12 intact, strength normal upper extremities and lower extremities, sensation normal throughout, DTRs 2+ throughout, no cerebellar signs, gait normal Psychiatric: normal affect, behavior normal, pleasant   Medicare Attestation I have personally reviewed: The patient's medical and social history Their use of alcohol, tobacco or illicit drugs Their current medications and supplements The patient's functional ability including ADLs,fall risks, home safety risks, cognitive, and hearing and visual impairment Diet and physical activities Evidence for depression or mood disorders  The patient's weight, height, BMI, and visual acuity have been recorded in the chart.  I have made referrals, counseling, and provided education to the patient based on review of the above and I have provided the patient with a written personalized care plan for preventive services.     Terri Piedra, PA-C   12/03/2015

## 2015-12-04 LAB — HEMOGLOBIN A1C
Hgb A1c MFr Bld: 6.5 % — ABNORMAL HIGH (ref <5.7)
MEAN PLASMA GLUCOSE: 140 mg/dL — AB (ref <117)

## 2015-12-31 ENCOUNTER — Other Ambulatory Visit: Payer: Self-pay | Admitting: *Deleted

## 2015-12-31 MED ORDER — WARFARIN SODIUM 2.5 MG PO TABS: ORAL_TABLET | ORAL | Status: DC

## 2016-01-02 ENCOUNTER — Ambulatory Visit (INDEPENDENT_AMBULATORY_CARE_PROVIDER_SITE_OTHER): Payer: Medicare Other | Admitting: *Deleted

## 2016-01-02 DIAGNOSIS — I4891 Unspecified atrial fibrillation: Secondary | ICD-10-CM

## 2016-01-02 DIAGNOSIS — Z8679 Personal history of other diseases of the circulatory system: Secondary | ICD-10-CM | POA: Diagnosis not present

## 2016-01-02 LAB — POCT INR: INR: 2.3

## 2016-01-12 NOTE — Progress Notes (Signed)
Patient ID: Adam [name], male   DOB: [birth year], 82 y.o.   MRN: [id number]   Cardiology Office Note    Date:  01/14/2016   ID:  Adam [name], DOB 17-Apr-1923, MRN [id number]  PCP:  Adam Corwin, MD  Cardiologist:  Dr. Charlton Haws      History of Present Illness: Adam [name] is a 82 y.o. male with a history of CAD status post multiple PCI procedures in the past, angioplasty to the RCA in 1995, paroxysmal atrial fibrillation (failed sotalol and started on amiodarone in 2001), HTN, HL, CKD.Adam Johns PA last year for ? Of claudication   F/U ABI's reviewed:  05/07/14  Non compressable but normal TBI and normal arterial duplex  Studies:  - LHC (08/1999):  Report not available- reported PDA 90%; medical therapy planned  - abdominal US (03/2011): No AAA  BP low in office  August 2016  and norvasc stopped quinapril decreased.   Improved now   Echo 06/13/15 reviewed normal EF moderate AS  Todays echo more mild with mean gradient 10 mmHg  Study Conclusions  - Left ventricle: The cavity size was normal. There was mild concentric hypertrophy. Systolic function was normal. The estimated ejection fraction was in the range of 60% to 65%. Wall motion was normal; there were no regional wall motion abnormalities. Doppler parameters are consistent with abnormal left ventricular relaxation (grade 1 diastolic dysfunction). The E/e&' ratio is >15, suggesting elevated LV filling pressure. - Aortic valve: Mildly calcified leaflets. Moderate aortic stenosis. There was trivial regurgitation. Mean gradient (S): 15 mm Hg. Peak gradient (S): 24 mm Hg. Valve area (VTI): 1.26 cm^2. Valve area (Vmax): 1.24 cm^2. Valve area (Vmean): 1.2 cm^2. - Mitral valve: Calcified annulus. Mildly thickened leaflets . There was mild regurgitation. - Left atrium: The atrium was normal in size. - Right atrium: The atrium was mildly dilated. - Tricuspid valve: There was mild  regurgitation. - Pulmonary arteries: PA peak pressure: 28 mm Hg (S). - Inferior vena cava: The vessel was normal in size. The respirophasic diameter changes were in the normal range (>= 50%), consistent with normal central venous pressure.   Active No angina, palpitations, syncope or dyspnea Compliant with meds   Recent Labs: 12/03/2015: ALT 16; Creat 1.72*; HDL 51; Hemoglobin 11.7*; LDL Cholesterol 104; Potassium 5.3; TSH 2.402  Wt Readings from Last 3 Encounters:  01/14/16 73.392 kg (161 lb 12.8 oz)  12/03/15 72.576 kg (160 lb)  07/30/15 72.576 kg (160 lb)     Past Medical History  Diagnosis Date  . Atrial fibrillation (HCC)   . SINUS BRADYCARDIA   . HYPERTENSION   . CORONARY ARTERY DISEASE   . VITAMIN B12 DEFICIENCY   . NEPHROLITHIASIS, HX OF   . MURMUR   . Leg pain     a. ABI (05/2014):  ABIs falsely elevated due to calcification; TBIs ok; dopplers without significant stenosis    Current Outpatient Prescriptions  Medication Sig Dispense Refill  . amiodarone (PACERONE) 200 MG tablet Take 100 mg by mouth daily.     Marland Kitchen aspirin 81 MG tablet Take 81 mg by mouth daily.      . Cholecalciferol (VITAMIN D-3) 1000 UNITS CAPS Take 4,000 Units by mouth daily.    Marland Kitchen levothyroxine (SYNTHROID, LEVOTHROID) 88 MCG tablet Take 1 tablet (88 mcg total) by mouth daily before breakfast. 90 tablet 3  . quinapril (ACCUPRIL) 20 MG tablet TAKE ONE TABLET BY MOUTH ONCE DAILY 90 tablet 2  . rosuvastatin (CRESTOR)  10 MG tablet Take 5 mg by mouth daily.    Marland Kitchen. warfarin (COUMADIN) 2.5 MG tablet Take as directed by coumadin clinic 100 tablet 0   No current facility-administered medications for this visit.    Allergies:   Review of patient's allergies indicates no known allergies.   Social History:  The patient  reports that he has never smoked. He does not have any smokeless tobacco history on file. He reports that he does not drink alcohol or use illicit drugs.   Family History:  The patient's  family history is not on file.   ROS:  Please see the history of present illness.      All other systems reviewed and negative.   PHYSICAL EXAM: VS:  BP 160/52 mmHg  Pulse 63  Ht 5\' 7"  (1.702 m)  Wt 73.392 kg (161 lb 12.8 oz)  BMI 25.34 kg/m2  SpO2 95% Well nourished, well developed, in no acute distress HEENT: normal Neck: no JVD Cardiac:  normal S1, S2; RRR; 2/6 systolic murmurat the RUSB Lungs:  clear to auscultation bilaterally, no wheezing, rhonchi or rales Abd: soft, nontender, no hepatomegaly Ext: no edema Vascular: Popliteal, PT, DP 2+ on the right; trace to 1+ on the left Skin: warm and dryechymosis RLE from accident  Neuro:  CNs 2-12 intact, no focal abnormalities noted  EKG:  05/28/15   Sinus bradycardia, HR 59, LAD, first degree AV block (PR 214 ms), nonspecific ST-T wave changes, no significant change when compared to prior tracing, QTc 431     ASSESSMENT AND PLAN:  1. Pain of lower extremity, unspecified laterality:  His symptoms are somewhat atypical. ABI's normal . 2. CORONARY ARTERY DISEASE:  Continue aspirin, statin. No angina. 3. Paroxysmal atrial fibrillation:  Maintaining NSR. Continue Coumadin, amiodarone.  Recent LFTs and TSH okay. 4. Hypertension:  BP low stop norvasc and cut quinipril to 20 mg f/u next available  5. Hyperlipidemia:  Recent LDL optimal. Continue statin.  6. Murmur:  AS more mild on echo today observe     Charlton HawsPeter Aiza [name]

## 2016-01-14 ENCOUNTER — Other Ambulatory Visit: Payer: Self-pay

## 2016-01-14 ENCOUNTER — Ambulatory Visit (INDEPENDENT_AMBULATORY_CARE_PROVIDER_SITE_OTHER): Payer: Medicare Other | Admitting: Cardiovascular Disease

## 2016-01-14 ENCOUNTER — Ambulatory Visit (HOSPITAL_COMMUNITY): Payer: Medicare Other | Attending: Internal Medicine

## 2016-01-14 ENCOUNTER — Encounter: Payer: Self-pay | Admitting: Cardiovascular Disease

## 2016-01-14 VITALS — BP 160/52 | HR 63 | Ht 67.0 in | Wt 161.8 lb

## 2016-01-14 DIAGNOSIS — E785 Hyperlipidemia, unspecified: Secondary | ICD-10-CM | POA: Diagnosis not present

## 2016-01-14 DIAGNOSIS — N189 Chronic kidney disease, unspecified: Secondary | ICD-10-CM | POA: Diagnosis not present

## 2016-01-14 DIAGNOSIS — I1310 Hypertensive heart and chronic kidney disease without heart failure, with stage 1 through stage 4 chronic kidney disease, or unspecified chronic kidney disease: Secondary | ICD-10-CM | POA: Insufficient documentation

## 2016-01-14 DIAGNOSIS — I77810 Thoracic aortic ectasia: Secondary | ICD-10-CM | POA: Diagnosis not present

## 2016-01-14 DIAGNOSIS — I2510 Atherosclerotic heart disease of native coronary artery without angina pectoris: Secondary | ICD-10-CM | POA: Diagnosis not present

## 2016-01-14 DIAGNOSIS — I352 Nonrheumatic aortic (valve) stenosis with insufficiency: Secondary | ICD-10-CM | POA: Diagnosis not present

## 2016-01-14 DIAGNOSIS — I340 Nonrheumatic mitral (valve) insufficiency: Secondary | ICD-10-CM | POA: Insufficient documentation

## 2016-01-14 DIAGNOSIS — I350 Nonrheumatic aortic (valve) stenosis: Secondary | ICD-10-CM

## 2016-01-14 DIAGNOSIS — Z8679 Personal history of other diseases of the circulatory system: Secondary | ICD-10-CM

## 2016-01-14 NOTE — Patient Instructions (Addendum)
Medication Instructions:  Your physician recommends that you continue on your current medications as directed. Please refer to the Current Medication list given to you today.  Labwork: NONE  Testing/Procedures: Your physician has requested that you have an echocardiogram in one year with office visit. Echocardiography is a painless test that uses sound waves to create images of your heart. It provides your doctor with information about the size and shape of your heart and how well your heart's chambers and valves are working. This procedure takes approximately one hour. There are no restrictions for this procedure.  Follow-Up: Your physician wants you to follow-up in: 12 months with Dr. Nishan. You will receive a reminder letter in the mail two months in advance. If you don't receive a letter, please call our office to schedule the follow-up appointment.   If you need a refill on your cardiac medications before your next appointment, please call your pharmacy.    

## 2016-01-20 ENCOUNTER — Other Ambulatory Visit: Payer: Self-pay | Admitting: Internal Medicine

## 2016-02-13 ENCOUNTER — Ambulatory Visit (INDEPENDENT_AMBULATORY_CARE_PROVIDER_SITE_OTHER): Payer: Medicare Other | Admitting: *Deleted

## 2016-02-13 DIAGNOSIS — Z8679 Personal history of other diseases of the circulatory system: Secondary | ICD-10-CM

## 2016-02-13 DIAGNOSIS — I4891 Unspecified atrial fibrillation: Secondary | ICD-10-CM

## 2016-02-13 LAB — POCT INR: INR: 2.8

## 2016-03-12 ENCOUNTER — Ambulatory Visit (INDEPENDENT_AMBULATORY_CARE_PROVIDER_SITE_OTHER): Payer: Medicare Other | Admitting: *Deleted

## 2016-03-12 DIAGNOSIS — I4891 Unspecified atrial fibrillation: Secondary | ICD-10-CM

## 2016-03-12 DIAGNOSIS — Z8679 Personal history of other diseases of the circulatory system: Secondary | ICD-10-CM

## 2016-03-12 LAB — POCT INR: INR: 2.2

## 2016-04-09 ENCOUNTER — Ambulatory Visit (INDEPENDENT_AMBULATORY_CARE_PROVIDER_SITE_OTHER): Payer: Medicare Other | Admitting: *Deleted

## 2016-04-09 DIAGNOSIS — Z8679 Personal history of other diseases of the circulatory system: Secondary | ICD-10-CM

## 2016-04-09 DIAGNOSIS — I4891 Unspecified atrial fibrillation: Secondary | ICD-10-CM | POA: Diagnosis not present

## 2016-04-09 LAB — POCT INR: INR: 2.8

## 2016-04-14 ENCOUNTER — Other Ambulatory Visit: Payer: Self-pay | Admitting: Cardiovascular Disease

## 2016-04-23 ENCOUNTER — Encounter: Payer: Self-pay | Admitting: Internal Medicine

## 2016-04-23 ENCOUNTER — Ambulatory Visit (INDEPENDENT_AMBULATORY_CARE_PROVIDER_SITE_OTHER): Payer: Medicare Other | Admitting: Internal Medicine

## 2016-04-23 VITALS — BP 152/64 | HR 52 | Temp 97.8°F | Resp 16 | Wt 158.0 lb

## 2016-04-23 DIAGNOSIS — M79641 Pain in right hand: Secondary | ICD-10-CM | POA: Diagnosis not present

## 2016-04-23 MED ORDER — PREDNISONE 20 MG PO TABS: ORAL_TABLET | ORAL | Status: DC

## 2016-04-24 NOTE — Progress Notes (Signed)
   Subjective:    Patient ID: Adam [name] MentionJames M Lyall, male    DOB: 01/17/1923, 82 y.o.   MRN: [id number]  Hand Injury  Incident onset: 10 days, but no specific injury. There was no injury mechanism. The pain is present in the right hand. The quality of the pain is described as aching (numbness to the middle and ring finger only). The pain does not radiate. The pain is moderate. The pain has been constant since the incident. Associated symptoms include muscle weakness, numbness and tingling. Pertinent negatives include no chest pain. Associated symptoms comments: Difficulty with finger flexion and extension . Exacerbated by: elevation of hand above heart. He has tried heat, acetaminophen, ice and elevation for the symptoms. The treatment provided no relief.   Patient does admit to recent fall, but hit only his right forearm.  Did also play golf yesterday but did not hit any divits.  No prior injuries.  There is some bruising to the palm x 3 days.     Review of Systems  Cardiovascular: Negative for chest pain.  Musculoskeletal: Positive for arthralgias. Negative for joint swelling.  Skin: Positive for color change. Negative for pallor and wound.  Neurological: Positive for tingling and numbness.       Objective:   Physical Exam  Constitutional: He appears well-developed and well-nourished. No distress.  Eyes: Conjunctivae are normal. No scleral icterus.  Cardiovascular: Normal rate, regular rhythm, normal heart sounds and intact distal pulses.  Exam reveals no gallop and no friction rub.   No murmur heard. Pulses:      Radial pulses are 2+ on the right side, and 2+ on the left side.  Pulmonary/Chest: Effort normal and breath sounds normal. No respiratory distress. He has no wheezes. He has no rales. He exhibits no tenderness.  Musculoskeletal:       Arms:      Right hand: He exhibits decreased range of motion. He exhibits no tenderness, no bony tenderness, normal capillary refill, no deformity,  no laceration and no swelling. Normal sensation noted. Normal strength noted.  Negative right hand tinels sign, negative phalens sign, negative tinels sign on the cubital tunnel.  Negative dequarvains.    Skin: He is not diaphoretic.  Nursing note and vitals reviewed.         Assessment & Plan:    1. Right hand pain -unclear etiology of numbness and pain -prednisone -RICE therapy -go to ER if pallor, worsening pain, or color change of the finger - Ambulatory referral to Orthopedics

## 2016-05-07 ENCOUNTER — Ambulatory Visit (INDEPENDENT_AMBULATORY_CARE_PROVIDER_SITE_OTHER): Payer: Medicare Other | Admitting: *Deleted

## 2016-05-07 DIAGNOSIS — I4891 Unspecified atrial fibrillation: Secondary | ICD-10-CM

## 2016-05-07 DIAGNOSIS — Z8679 Personal history of other diseases of the circulatory system: Secondary | ICD-10-CM

## 2016-05-07 LAB — POCT INR: INR: 4.7

## 2016-05-21 ENCOUNTER — Ambulatory Visit (INDEPENDENT_AMBULATORY_CARE_PROVIDER_SITE_OTHER): Payer: Medicare Other | Admitting: Pharmacist

## 2016-05-21 DIAGNOSIS — Z8679 Personal history of other diseases of the circulatory system: Secondary | ICD-10-CM

## 2016-05-21 DIAGNOSIS — I4891 Unspecified atrial fibrillation: Secondary | ICD-10-CM | POA: Diagnosis not present

## 2016-05-21 LAB — POCT INR: INR: 4.9

## 2016-06-04 ENCOUNTER — Ambulatory Visit (INDEPENDENT_AMBULATORY_CARE_PROVIDER_SITE_OTHER): Payer: Medicare Other | Admitting: *Deleted

## 2016-06-04 DIAGNOSIS — Z8679 Personal history of other diseases of the circulatory system: Secondary | ICD-10-CM | POA: Diagnosis not present

## 2016-06-04 DIAGNOSIS — I4891 Unspecified atrial fibrillation: Secondary | ICD-10-CM

## 2016-06-04 LAB — POCT INR: INR: 2.9

## 2016-06-09 ENCOUNTER — Encounter: Payer: Self-pay | Admitting: Internal Medicine

## 2016-06-09 ENCOUNTER — Ambulatory Visit (INDEPENDENT_AMBULATORY_CARE_PROVIDER_SITE_OTHER): Payer: Medicare Other | Admitting: Internal Medicine

## 2016-06-09 VITALS — BP 142/60 | HR 64 | Temp 97.3°F | Resp 16 | Ht 67.75 in | Wt 158.6 lb

## 2016-06-09 DIAGNOSIS — E1122 Type 2 diabetes mellitus with diabetic chronic kidney disease: Secondary | ICD-10-CM

## 2016-06-09 DIAGNOSIS — E782 Mixed hyperlipidemia: Secondary | ICD-10-CM

## 2016-06-09 DIAGNOSIS — E559 Vitamin D deficiency, unspecified: Secondary | ICD-10-CM

## 2016-06-09 DIAGNOSIS — I10 Essential (primary) hypertension: Secondary | ICD-10-CM

## 2016-06-09 DIAGNOSIS — Z79899 Other long term (current) drug therapy: Secondary | ICD-10-CM

## 2016-06-09 DIAGNOSIS — K219 Gastro-esophageal reflux disease without esophagitis: Secondary | ICD-10-CM

## 2016-06-09 DIAGNOSIS — N183 Chronic kidney disease, stage 3 unspecified: Secondary | ICD-10-CM

## 2016-06-09 DIAGNOSIS — N320 Bladder-neck obstruction: Secondary | ICD-10-CM | POA: Diagnosis not present

## 2016-06-09 DIAGNOSIS — N419 Inflammatory disease of prostate, unspecified: Secondary | ICD-10-CM

## 2016-06-09 DIAGNOSIS — E039 Hypothyroidism, unspecified: Secondary | ICD-10-CM

## 2016-06-09 LAB — HEPATIC FUNCTION PANEL
ALBUMIN: 4.1 g/dL (ref 3.6–5.1)
ALT: 13 U/L (ref 9–46)
AST: 16 U/L (ref 10–35)
Alkaline Phosphatase: 60 U/L (ref 40–115)
BILIRUBIN DIRECT: 0.1 mg/dL (ref <=0.2)
BILIRUBIN TOTAL: 0.6 mg/dL (ref 0.2–1.2)
Indirect Bilirubin: 0.5 mg/dL (ref 0.2–1.2)
Total Protein: 6.6 g/dL (ref 6.1–8.1)

## 2016-06-09 LAB — BASIC METABOLIC PANEL WITH GFR
BUN: 37 mg/dL — AB (ref 7–25)
CHLORIDE: 108 mmol/L (ref 98–110)
CO2: 23 mmol/L (ref 20–31)
CREATININE: 1.9 mg/dL — AB (ref 0.70–1.11)
Calcium: 9.3 mg/dL (ref 8.6–10.3)
GFR, EST AFRICAN AMERICAN: 35 mL/min — AB (ref >=60)
GFR, Est Non African American: 30 mL/min — ABNORMAL LOW (ref >=60)
Glucose, Bld: 149 mg/dL — ABNORMAL HIGH (ref 65–99)
Potassium: 5 mmol/L (ref 3.5–5.3)
SODIUM: 141 mmol/L (ref 135–146)

## 2016-06-09 LAB — CBC WITH DIFFERENTIAL/PLATELET
BASOS ABS: 77 {cells}/uL (ref 0–200)
Basophils Relative: 1 %
EOS PCT: 2 %
Eosinophils Absolute: 154 cells/uL (ref 15–500)
HCT: 34.6 % — ABNORMAL LOW (ref 38.5–50.0)
HEMOGLOBIN: 11.1 g/dL — AB (ref 13.2–17.1)
LYMPHS ABS: 2310 {cells}/uL (ref 850–3900)
Lymphocytes Relative: 30 %
MCH: 32.9 pg (ref 27.0–33.0)
MCHC: 32.1 g/dL (ref 32.0–36.0)
MCV: 102.7 fL — AB (ref 80.0–100.0)
MPV: 9.5 fL (ref 7.5–12.5)
Monocytes Absolute: 847 cells/uL (ref 200–950)
Monocytes Relative: 11 %
NEUTROS PCT: 56 %
Neutro Abs: 4312 cells/uL (ref 1500–7800)
PLATELETS: 214 10*3/uL (ref 140–400)
RBC: 3.37 MIL/uL — ABNORMAL LOW (ref 4.20–5.80)
RDW: 14.1 % (ref 11.0–15.0)
WBC: 7.7 10*3/uL (ref 3.8–10.8)

## 2016-06-09 LAB — LIPID PANEL
CHOL/HDL RATIO: 3.5 ratio (ref <=5.0)
CHOLESTEROL: 188 mg/dL (ref 125–200)
HDL: 54 mg/dL (ref >=40)
LDL Cholesterol: 101 mg/dL (ref <130)
TRIGLYCERIDES: 166 mg/dL — AB (ref <150)
VLDL: 33 mg/dL — ABNORMAL HIGH (ref <30)

## 2016-06-09 LAB — TSH: TSH: 2.24 mIU/L (ref 0.40–4.50)

## 2016-06-09 LAB — PSA: PSA: 2.3 ng/mL (ref <=4.0)

## 2016-06-09 LAB — MAGNESIUM: MAGNESIUM: 1.9 mg/dL (ref 1.5–2.5)

## 2016-06-09 MED ORDER — TAMSULOSIN HCL 0.4 MG PO CAPS: ORAL_CAPSULE | ORAL | 1 refills | Status: DC

## 2016-06-09 NOTE — Patient Instructions (Signed)
Benign Prostatic Hyperplasia An enlarged prostate (benign prostatic hyperplasia) is common in older men. You may experience the following:  Weak urine stream.  Dribbling.  Feeling like the bladder has not emptied completely.  Difficulty starting urination.  Getting up frequently at night to urinate.  Urinating more frequently during the day. HOME CARE INSTRUCTIONS  Monitor your prostatic hyperplasia for any changes. The following actions may help to alleviate any discomfort you are experiencing:  Give yourself time when you urinate.  Stay away from alcohol.  Avoid beverages containing caffeine, such as coffee, tea, and colas, because they can make the problem worse.  Avoid decongestants, antihistamines, and some prescription medicines that can make the problem worse.  Follow up with your health care provider for further treatment as recommended. SEEK MEDICAL CARE IF:  You are experiencing progressive difficulty voiding.  Your urine stream is progressively getting narrower.  You are awaking from sleep with the urge to void more frequently.  You are constantly feeling the need to void.  You experience loss of urine, especially in small amounts. SEEK IMMEDIATE MEDICAL CARE IF:   You develop increased pain with urination or are unable to urinate.  You develop severe abdominal pain, vomiting, a high fever, or fainting.  You develop back pain or blood in your urine. MAKE SURE YOU:   Understand these instructions.  Will watch your condition.  Will get help right away if you are not doing well or get worse.   This information is not intended to replace advice given to you by your health care provider. Make sure you discuss any questions you have with your health care provider.   Document Released: 10/19/2005 Document Revised: 11/09/2014 Document Reviewed: 03/21/2013 Elsevier Interactive Patient Education 2016 [employer]  Inc[employer]   ++++++++++++++++++++++++++++++++++++++++ Recommend Adult Low Dose Aspirin or  coated  Aspirin 81 mg daily  To reduce risk of Colon Cancer 20 %,  Skin Cancer 26 % ,  Melanoma 46%  and  Pancreatic cancer 60% ++++++++++++++++++++++++++++++++++++++++++++++++++++++ Vitamin D goal  is between 70-100.  Please make sure that you are taking your Vitamin D as directed.  It is very important as a natural anti-inflammatory  helping hair, skin, and nails, as well as reducing stroke and heart attack risk.  It helps your bones and helps with mood. It also decreases numerous cancer risks so please take it as directed.  Low Vit D is associated with a 200-300% higher risk for CANCER  and 200-300% higher risk for HEART   ATTACK  &  STROKE.   .....................................Marland Kitchen It is also associated with higher death rate at younger ages,  autoimmune diseases like Rheumatoid arthritis, Lupus, Multiple Sclerosis.    Also many other serious conditions, like depression, Alzheimer's Dementia, infertility, muscle aches, fatigue, fibromyalgia - just to name a few. ++++++++++++++++++++++++++++++++++++++++++++++++ Recommend the book "The END of DIETING" by Dr Monico Hoar  & the book "The END of DIABETES " by Dr Monico Hoar At [email or web address] - get book & Audio CD's    Being diabetic has a  300% increased risk for heart attack, stroke, cancer, and alzheimer- type vascular dementia. It is very important that you work harder with diet by avoiding all foods that are white. Avoid white rice (brown & wild rice is OK), white potatoes (sweetpotatoes in moderation is OK), White bread or wheat bread or anything made out of white flour like bagels, donuts, rolls, buns, biscuits, cakes, pastries, cookies, pizza crust, and pasta (made from white flour & egg whites) -  vegetarian pasta or spinach or wheat pasta is OK. Multigrain breads like Arnold's or Pepper[employer], or multigrain sandwich thins or flatbreads.  Diet,  exercise and weight loss can reverse and cure diabetes in the early stages.  Diet, exercise and weight loss is very important in the control and prevention of complications of diabetes which affects every system in your body, ie. Brain - dementia/stroke, eyes - glaucoma/blindness, heart - heart attack/heart failure, kidneys - dialysis, stomach - gastric paralysis, intestines - malabsorption, nerves - severe painful neuritis, circulation - gangrene & loss of a leg(s), and finally cancer and Alzheimers.    I recommend avoid fried & greasy foods,  sweets/candy, white rice (brown or wild rice or Quinoa is OK), white potatoes (sweet potatoes are OK) - anything made from white flour - bagels, doughnuts, rolls, buns, biscuits,white and wheat breads, pizza crust and traditional pasta made of white flour & egg white(vegetarian pasta or spinach or wheat pasta is OK).  Multi-grain bread is OK - like multi-grain flat bread or sandwich thins. Avoid alcohol in excess. Exercise is also important.    Eat all the vegetables you want - avoid meat, especially red meat and dairy - especially cheese.  Cheese is the most concentrated form of trans-fats which is the worst thing to clog up our arteries. Veggie cheese is OK which can be found in the fresh produce section at [employer] or [employer] or [employer]  ++++++++++++++++++++++++++++++++++++++++++++++++++ DASH Eating Plan  DASH stands for "Dietary Approaches to Stop Hypertension."   The DASH eating plan is a healthy eating plan that has been shown to reduce high blood pressure (hypertension). Additional health benefits may include reducing the risk of type 2 diabetes mellitus, heart disease, and stroke. The DASH eating plan may also help with weight loss. WHAT DO I NEED TO KNOW ABOUT THE DASH EATING PLAN? For the DASH eating plan, you will follow these general guidelines:  Choose foods with a percent daily value for sodium of less than 5% (as listed on the food  label).  Use salt-free seasonings or herbs instead of table salt or sea salt.  Check with your health care provider or pharmacist before using salt substitutes.  Eat lower-sodium products, often labeled as "lower sodium" or "no salt added."  Eat fresh foods.  Eat more vegetables, fruits, and low-fat dairy products.  Choose whole grains. Look for the word "whole" as the first word in the ingredient list.  Choose fish   Limit sweets, desserts, sugars, and sugary drinks.  Choose heart-healthy fats.  Eat veggie cheese   Eat more home-cooked food and less restaurant, buffet, and fast food.  Limit fried foods.  Cook foods using methods other than frying.  Limit canned vegetables. If you do use them, rinse them well to decrease the sodium.  When eating at a restaurant, ask that your food be prepared with less salt, or no salt if possible.                      WHAT FOODS CAN I EAT? Read Dr Francis DowseJoel Fuhrman's books on The End of Dieting & The End of Diabetes  Grains Whole grain or whole wheat bread. Brown rice. Whole grain or whole wheat pasta. Quinoa, bulgur, and whole grain cereals. Low-sodium cereals. Corn or whole wheat flour tortillas. Whole grain cornbread. Whole grain crackers. Low-sodium crackers.  Vegetables Fresh or frozen vegetables (raw, steamed, roasted, or grilled). Low-sodium or reduced-sodium tomato and vegetable juices. Low-sodium or  reduced-sodium tomato sauce and paste. Low-sodium or reduced-sodium canned vegetables.   Fruits All fresh, canned (in natural juice), or frozen fruits.  Protein Products  All fish and seafood.  Dried beans, peas, or lentils. Unsalted nuts and seeds. Unsalted canned beans.  Dairy Low-fat dairy products, such as skim or 1% milk, 2% or reduced-fat cheeses, low-fat ricotta or cottage cheese, or plain low-fat yogurt. Low-sodium or reduced-sodium cheeses.  Fats and Oils Tub margarines without trans fats. Light or reduced-fat mayonnaise  and salad dressings (reduced sodium). Avocado. Safflower, olive, or canola oils. Natural peanut or almond butter.  Other Unsalted popcorn and pretzels. The items listed above may not be a complete list of recommended foods or beverages. Contact your dietitian for more options.  +++++++++++++++++++++++++++++++++++++++++++  WHAT FOODS ARE NOT RECOMMENDED? Grains/ White flour or wheat flour White bread. White pasta. White rice. Refined cornbread. Bagels and croissants. Crackers that contain trans fat.  Vegetables  Creamed or fried vegetables. Vegetables in a . Regular canned vegetables. Regular canned tomato sauce and paste. Regular tomato and vegetable juices.  Fruits Dried fruits. Canned fruit in light or heavy syrup. Fruit juice.  Meat and Other Protein Products Meat in general - RED meat & White meat.  Fatty cuts of meat. Ribs, chicken wings, all processed meats as bacon, sausage, bologna, salami, fatback, hot dogs, bratwurst and packaged luncheon meats.  Dairy Whole or 2% milk, cream, half-and-half, and cream cheese. Whole-fat or sweetened yogurt. Full-fat cheeses or blue cheese. Non-dairy creamers and whipped toppings. Processed cheese, cheese spreads, or cheese curds.  Condiments Onion and garlic salt, seasoned salt, table salt, and sea salt. Canned and packaged gravies. Worcestershire sauce. Tartar sauce. Barbecue sauce. Teriyaki sauce. Soy sauce, including reduced sodium. Steak sauce. Fish sauce. Oyster sauce. Cocktail sauce. Horseradish. Ketchup and mustard. Meat flavorings and tenderizers. Bouillon cubes. Hot sauce. Tabasco sauce. Marinades. Taco seasonings. Relishes.  Fats and Oils Butter, stick margarine, lard, shortening and bacon fat. Coconut, palm kernel, or palm oils. Regular salad dressings.  Pickles and olives. Salted popcorn and pretzels.  The items listed above may not be a complete list of foods and beverages to avoid.

## 2016-06-09 NOTE — Progress Notes (Signed)
[name]                       [name], M.D.        Dyanne CarrelAmanda R. Steffanie Dunnollier, P.A.-C       Terri Piedraourtney Forcucci, P.A.-C   [facility]                [address]                [place], [place] [id number] Telephone [phone] Telefax [phone] ______________________________________________________________________     This very nice 82 y.o.MWM presents for  follow up with Hypertension, Hyperlipidemia, T2_NIDDM and Vitamin D Deficiency. He also reports recent onset of prostatism with LUTS, specifically urinary freq and sensation of incomplete emptying and double voiding. He has had mild dysuria. Denies fever or change in urine.      Patient is treated for HTN circa 1970's and presented ACS and had PCA/Stents x 2 in 1990 & 2000 and also has hx/o cAfib and is on Coumadin via [place] [facility]. BP has been controlled at home. Today's BP is 142/60. Patient has had no complaints of any cardiac type chest pain, palpitations, dyspnea/orthopnea/PND, dizziness, claudication, or dependent edema.     Hyperlipidemia is controlled with diet & meds. Patient denies myalgias or other med SE's. Last Lipids were almost at goal: Lab Results  Component Value Date   CHOL 179 12/03/2015   HDL 51 12/03/2015   LDLCALC 104 12/03/2015   TRIG 121 12/03/2015   CHOLHDL 3.5 12/03/2015      Also, the patient has history of preDiabetes with A1c 6.3% in 2008 and now is in early T2_NIDDM attempting control with diet and has had no symptoms of reactive hypoglycemia, diabetic polys, paresthesias or visual blurring.  Last A1c was not at goal: Lab Results  Component Value Date   HGBA1C 6.5 (H) 12/03/2015      Further, the patient also has history of Vitamin D Deficiency of "25" in 2008 and supplements vitamin D without any suspected side-effects. Last vitamin D was better: Lab Results  Component Value Date   VD25OH 56 05/28/2015   Current  Outpatient Prescriptions on File Prior to Visit  Medication Sig  . amiodarone (PACERONE) 200 MG tablet Take 100 mg by mouth daily.   Marland Kitchen. aspirin 81 MG tablet Take 81 mg by mouth daily.    . Cholecalciferol (VITAMIN D-3) 1000 UNITS CAPS Take 4,000 Units by mouth daily.  Marland Kitchen. levothyroxine (SYNTHROID, LEVOTHROID) 88 MCG tablet TAKE ONE TABLET BY MOUTH ONCE DAILY BEFORE BREAKFAST  . quinapril (ACCUPRIL) 20 MG tablet TAKE ONE TABLET BY MOUTH ONCE DAILY  . rosuvastatin (CRESTOR) 10 MG tablet Take 5 mg by mouth daily.  Marland Kitchen. warfarin (COUMADIN) 2.5 MG tablet TAKE AS DIRECTED BY COUMADIN CLINIC.   No current facility-administered medications on file prior to visit.    No Known Allergies  PMHx:   Past Medical History:  Diagnosis Date  . Atrial fibrillation (HCC)   . CORONARY ARTERY DISEASE   . HYPERTENSION   . Leg pain    a. ABI (05/2014):  ABIs falsely elevated due to calcification; TBIs ok; dopplers without significant stenosis  . MURMUR   . NEPHROLITHIASIS, HX OF   . SINUS BRADYCARDIA   . VITAMIN B12 DEFICIENCY    Immunization History  Administered Date(s) Administered  . DT 12/03/2015  . Influenza Split 07/06/2013  . Influenza, High Dose Seasonal PF  08/09/2014, 07/25/2015  . Pneumococcal Polysaccharide-23 01/18/2008  . Td 11/02/2004  . Zoster 10/07/2009   Past Surgical History:  Procedure Laterality Date  . ANGIOPLASTY     FHx:    Reviewed / unchanged  SHx:    Reviewed / unchanged  Systems Review:  Constitutional: Denies fever, chills, wt changes, headaches, insomnia, fatigue, night sweats, change in appetite. Eyes: Denies redness, blurred vision, diplopia, discharge, itchy, watery eyes.  ENT: Denies discharge, congestion, post nasal drip, epistaxis, sore throat, earache, hearing loss, dental pain, tinnitus, vertigo, sinus pain, snoring.  CV: Denies chest pain, palpitations, irregular heartbeat, syncope, dyspnea, diaphoresis, orthopnea, PND, claudication or edema. Respiratory:  denies cough, dyspnea, DOE, pleurisy, hoarseness, laryngitis, wheezing.  Gastrointestinal: Denies dysphagia, odynophagia, heartburn, reflux, water brash, abdominal pain or cramps, nausea, vomiting, bloating, diarrhea, constipation, hematemesis, melena, hematochezia  or hemorrhoids. Genitourinary: Denies dysuria, frequency, urgency, nocturia, hesitancy, discharge, hematuria or flank pain. Musculoskeletal: Denies arthralgias, myalgias, stiffness, jt. swelling, pain, limping or strain/sprain.  Skin: Denies pruritus, rash, hives, warts, acne, eczema or change in skin lesion(s). Neuro: No weakness, tremor, incoordination, spasms, paresthesia or pain. Psychiatric: Denies confusion, memory loss or sensory loss. Endo: Denies change in weight, skin or hair change.  Heme/Lymph: No excessive bleeding, bruising or enlarged lymph nodes.  Physical Exam  BP (!) 142/60   Pulse 64   Temp 97.3 F (36.3 C)   Resp 16   Ht 5' 7.75" (1.721 m)   Wt 158 lb 9.6 oz (71.9 kg)   BMI 24.29 kg/m   Appears well nourished and in no distress.  Eyes: PERRLA, EOMs, conjunctiva no swelling or erythema. Sinuses: No frontal/maxillary tenderness ENT/Mouth: EAC's clear, TM's nl w/o erythema, bulging. Nares clear w/o erythema, swelling, exudates. Oropharynx clear without erythema or exudates. Oral hygiene is good. Tongue normal, non obstructing. Hearing intact.  Neck: Supple. Thyroid nl. Car 2+/2+ without bruits, nodes or JVD. Chest: Respirations nl with BS clear & equal w/o rales, rhonchi, wheezing or stridor.  Cor: Heart sounds normal w/ regular rate and rhythm without sig. murmurs, gallops, clicks, or rubs. Peripheral pulses normal and equal  without edema.  Abdomen: Soft & bowel sounds normal. Non-tender w/o guarding, rebound, hernias, masses, or organomegaly.  Lymphatics: Unremarkable.  Musculoskeletal: Full ROM all peripheral extremities, joint stability, 5/5 strength, and normal gait.  Skin: Warm, dry without exposed  rashes, lesions or ecchymosis apparent.  Neuro: Cranial nerves intact, reflexes equal bilaterally. Sensory-motor testing grossly intact. Tendon reflexes grossly intact.  Pysch: Alert & oriented x 3.  Insight and judgement nl & appropriate. No ideations.  Assessment and Plan:  1. Essential hypertension  - Continue medication, monitor blood pressure at home. Continue DASH diet. Reminder to go to the ER if any CP, SOB, nausea, dizziness, severe HA, changes vision/speech, left arm numbness and tingling and jaw pain. - TSH  2. Hyperlipidemia  - Continue diet/meds, exercise,& lifestyle modifications. Continue monitor periodic cholesterol/liver & renal functions  - Lipid panel - TSH  3. Type 2 diabetes mellitus with stage 3 chronic kidney disease, without long-term current use of insulin (HCC)  - Continue diet, exercise, lifestyle modifications. Monitor appropriate labs. - Hemoglobin A1c - Insulin, random  4. Vitamin D deficiency  - Continue supplementation. - VITAMIN D 25 Hydroxy (Vit-D Deficiency, Fractures)  5. Gastroesophageal reflux disease, esophagitis presence not specified   6. Hypothyroidism, unspecified hypothyroidism type  - TSH  7. Bladder neck obstruction  - PSA - tamsulosin (FLOMAX) 0.4 MG CAPS capsule; Take 1 capsule daily at  bedtime for prostate  Dispense: 90 capsule; Refill: 1  8. Prostatitis  - PSA - Urinalysis, Routine w reflex microscopic  - Urine culture  9. Medication management  - CBC with Differential/Platelet - BASIC METABOLIC PANEL WITH GFR - Hepatic function panel - Magnesium  Recommended regular exercise, BP monitoring, weight control, and discussed med and SE's. Recommended labs to assess and monitor clinical status. Further disposition pending results of labs. Over 30 minutes of exam, counseling, chart review was performed

## 2016-06-10 LAB — URINALYSIS, ROUTINE W REFLEX MICROSCOPIC
BILIRUBIN URINE: NEGATIVE
HGB URINE DIPSTICK: NEGATIVE
Ketones, ur: NEGATIVE
Leukocytes, UA: NEGATIVE
Nitrite: NEGATIVE
PROTEIN: NEGATIVE
Specific Gravity, Urine: 1.016 (ref 1.001–1.035)
pH: 5.5 (ref 5.0–8.0)

## 2016-06-10 LAB — INSULIN, RANDOM: INSULIN: 38.7 u[IU]/mL — AB (ref 2.0–19.6)

## 2016-06-10 LAB — HEMOGLOBIN A1C
Hgb A1c MFr Bld: 7.1 % — ABNORMAL HIGH (ref <5.7)
Mean Plasma Glucose: 157 mg/dL

## 2016-06-10 LAB — VITAMIN D 25 HYDROXY (VIT D DEFICIENCY, FRACTURES): VIT D 25 HYDROXY: 65 ng/mL (ref 30–100)

## 2016-06-11 ENCOUNTER — Other Ambulatory Visit: Payer: Self-pay | Admitting: Internal Medicine

## 2016-06-11 DIAGNOSIS — N320 Bladder-neck obstruction: Secondary | ICD-10-CM

## 2016-06-11 LAB — URINE CULTURE: ORGANISM ID, BACTERIA: NO GROWTH

## 2016-06-16 ENCOUNTER — Other Ambulatory Visit: Payer: Self-pay | Admitting: Internal Medicine

## 2016-06-16 DIAGNOSIS — N320 Bladder-neck obstruction: Secondary | ICD-10-CM

## 2016-06-16 MED ORDER — DOXAZOSIN MESYLATE 2 MG PO TABS: ORAL_TABLET | ORAL | 0 refills | Status: DC

## 2016-06-17 ENCOUNTER — Encounter: Payer: Self-pay | Admitting: Internal Medicine

## 2016-07-02 ENCOUNTER — Ambulatory Visit (INDEPENDENT_AMBULATORY_CARE_PROVIDER_SITE_OTHER): Payer: Medicare Other | Admitting: Pharmacist

## 2016-07-02 DIAGNOSIS — I4891 Unspecified atrial fibrillation: Secondary | ICD-10-CM | POA: Diagnosis not present

## 2016-07-02 DIAGNOSIS — Z8679 Personal history of other diseases of the circulatory system: Secondary | ICD-10-CM | POA: Diagnosis not present

## 2016-07-02 LAB — POCT INR: INR: 3.1

## 2016-07-16 ENCOUNTER — Other Ambulatory Visit: Payer: Self-pay | Admitting: Internal Medicine

## 2016-07-24 ENCOUNTER — Other Ambulatory Visit: Payer: Self-pay | Admitting: Cardiovascular Disease

## 2016-07-28 ENCOUNTER — Other Ambulatory Visit: Payer: Self-pay | Admitting: Cardiovascular Disease

## 2016-07-30 ENCOUNTER — Ambulatory Visit (INDEPENDENT_AMBULATORY_CARE_PROVIDER_SITE_OTHER): Payer: Medicare Other | Admitting: *Deleted

## 2016-07-30 DIAGNOSIS — I4891 Unspecified atrial fibrillation: Secondary | ICD-10-CM

## 2016-07-30 DIAGNOSIS — Z8679 Personal history of other diseases of the circulatory system: Secondary | ICD-10-CM

## 2016-07-30 LAB — POCT INR: INR: 2.8

## 2016-08-05 MED ORDER — DOXAZOSIN MESYLATE 4 MG PO TABS: ORAL_TABLET | ORAL | 1 refills | Status: DC

## 2016-08-20 ENCOUNTER — Ambulatory Visit (INDEPENDENT_AMBULATORY_CARE_PROVIDER_SITE_OTHER): Payer: Medicare Other | Admitting: *Deleted

## 2016-08-20 DIAGNOSIS — Z23 Encounter for immunization: Secondary | ICD-10-CM | POA: Diagnosis not present

## 2016-09-01 ENCOUNTER — Ambulatory Visit (INDEPENDENT_AMBULATORY_CARE_PROVIDER_SITE_OTHER): Payer: Medicare Other | Admitting: *Deleted

## 2016-09-01 DIAGNOSIS — I4891 Unspecified atrial fibrillation: Secondary | ICD-10-CM | POA: Diagnosis not present

## 2016-09-01 DIAGNOSIS — Z8679 Personal history of other diseases of the circulatory system: Secondary | ICD-10-CM

## 2016-09-01 LAB — POCT INR: INR: 2.8

## 2016-09-04 ENCOUNTER — Other Ambulatory Visit: Payer: Self-pay | Admitting: *Deleted

## 2016-10-01 ENCOUNTER — Ambulatory Visit (INDEPENDENT_AMBULATORY_CARE_PROVIDER_SITE_OTHER): Payer: Medicare Other | Admitting: Internal Medicine

## 2016-10-01 VITALS — BP 136/50 | HR 64 | Temp 97.7°F | Resp 16 | Ht 67.5 in | Wt 159.6 lb

## 2016-10-01 DIAGNOSIS — I10 Essential (primary) hypertension: Secondary | ICD-10-CM | POA: Diagnosis not present

## 2016-10-01 DIAGNOSIS — Z1212 Encounter for screening for malignant neoplasm of rectum: Secondary | ICD-10-CM

## 2016-10-01 DIAGNOSIS — E782 Mixed hyperlipidemia: Secondary | ICD-10-CM

## 2016-10-01 DIAGNOSIS — N183 Chronic kidney disease, stage 3 (moderate): Secondary | ICD-10-CM

## 2016-10-01 DIAGNOSIS — E559 Vitamin D deficiency, unspecified: Secondary | ICD-10-CM

## 2016-10-01 DIAGNOSIS — I2510 Atherosclerotic heart disease of native coronary artery without angina pectoris: Secondary | ICD-10-CM

## 2016-10-01 DIAGNOSIS — Z136 Encounter for screening for cardiovascular disorders: Secondary | ICD-10-CM | POA: Diagnosis not present

## 2016-10-01 DIAGNOSIS — Z125 Encounter for screening for malignant neoplasm of prostate: Secondary | ICD-10-CM

## 2016-10-01 DIAGNOSIS — K219 Gastro-esophageal reflux disease without esophagitis: Secondary | ICD-10-CM

## 2016-10-01 DIAGNOSIS — Z0001 Encounter for general adult medical examination with abnormal findings: Secondary | ICD-10-CM

## 2016-10-01 DIAGNOSIS — E1122 Type 2 diabetes mellitus with diabetic chronic kidney disease: Secondary | ICD-10-CM

## 2016-10-01 DIAGNOSIS — I4891 Unspecified atrial fibrillation: Secondary | ICD-10-CM

## 2016-10-01 DIAGNOSIS — Z0000 Encounter for general adult medical examination without abnormal findings: Secondary | ICD-10-CM | POA: Diagnosis not present

## 2016-10-01 DIAGNOSIS — Z79899 Other long term (current) drug therapy: Secondary | ICD-10-CM

## 2016-10-01 LAB — BASIC METABOLIC PANEL WITH GFR
BUN: 31 mg/dL — ABNORMAL HIGH (ref 7–25)
CALCIUM: 9.2 mg/dL (ref 8.6–10.3)
CO2: 23 mmol/L (ref 20–31)
CREATININE: 1.77 mg/dL — AB (ref 0.70–1.11)
Chloride: 107 mmol/L (ref 98–110)
GFR, Est African American: 38 mL/min — ABNORMAL LOW (ref >=60)
GFR, Est Non African American: 33 mL/min — ABNORMAL LOW (ref >=60)
GLUCOSE: 189 mg/dL — AB (ref 65–99)
Potassium: 5 mmol/L (ref 3.5–5.3)
SODIUM: 141 mmol/L (ref 135–146)

## 2016-10-01 LAB — CBC WITH DIFFERENTIAL/PLATELET
BASOS ABS: 68 {cells}/uL (ref 0–200)
Basophils Relative: 1 %
EOS PCT: 3 %
Eosinophils Absolute: 204 cells/uL (ref 15–500)
HEMATOCRIT: 33.6 % — AB (ref 38.5–50.0)
HEMOGLOBIN: 11 g/dL — AB (ref 13.2–17.1)
LYMPHS ABS: 1768 {cells}/uL (ref 850–3900)
Lymphocytes Relative: 26 %
MCH: 32.2 pg (ref 27.0–33.0)
MCHC: 32.7 g/dL (ref 32.0–36.0)
MCV: 98.2 fL (ref 80.0–100.0)
MPV: 9.1 fL (ref 7.5–12.5)
Monocytes Absolute: 544 cells/uL (ref 200–950)
Monocytes Relative: 8 %
NEUTROS PCT: 62 %
Neutro Abs: 4216 cells/uL (ref 1500–7800)
Platelets: 276 10*3/uL (ref 140–400)
RBC: 3.42 MIL/uL — ABNORMAL LOW (ref 4.20–5.80)
RDW: 14.1 % (ref 11.0–15.0)
WBC: 6.8 10*3/uL (ref 3.8–10.8)

## 2016-10-01 LAB — HEPATIC FUNCTION PANEL
ALBUMIN: 3.9 g/dL (ref 3.6–5.1)
ALT: 15 U/L (ref 9–46)
AST: 18 U/L (ref 10–35)
Alkaline Phosphatase: 62 U/L (ref 40–115)
Bilirubin, Direct: 0.1 mg/dL (ref <=0.2)
Indirect Bilirubin: 0.4 mg/dL (ref 0.2–1.2)
TOTAL PROTEIN: 6.7 g/dL (ref 6.1–8.1)
Total Bilirubin: 0.5 mg/dL (ref 0.2–1.2)

## 2016-10-01 LAB — MAGNESIUM: MAGNESIUM: 1.9 mg/dL (ref 1.5–2.5)

## 2016-10-01 LAB — LIPID PANEL
CHOLESTEROL: 168 mg/dL (ref <200)
HDL: 46 mg/dL (ref >40)
LDL Cholesterol: 101 mg/dL — ABNORMAL HIGH (ref <100)
Total CHOL/HDL Ratio: 3.7 Ratio (ref <5.0)
Triglycerides: 107 mg/dL (ref <150)
VLDL: 21 mg/dL (ref <30)

## 2016-10-01 NOTE — Patient Instructions (Signed)

## 2016-10-01 NOTE — Progress Notes (Signed)
[facility]   Lucky Cowboy, M.D.    Dyanne Carrel. Steffanie Dunn, P.A.-C      Terri Piedra, P.A.-C  [facility]                [address]                [place], [place]. [address] Telephone [phone] Telefax [phone] Annual  Screening/Preventative Visit  & Comprehensive Evaluation & Examination     This very nice 82 y.o. MWM presents for a Screening/Preventative Visit & comprehensive evaluation and management of multiple medical co-morbidities.  Patient has been followed for HTN, T2_NIDDM, Hyperlipidemia and Vitamin D Deficiency. Pastient also has hx/o GERD controlled on diet.     HTN predates since the 1970's. Patient's BP has been controlled at home. Patient had PTCA x 2 in 1990 & 2000 and also is on Coumadin for cAfib. Today's BP is at goal -136/50. Patient denies any cardiac symptoms as chest pain, palpitations, shortness of breath, dizziness or ankle swelling.     Patient's hyperlipidemia is controlled with diet and medications. Patient denies myalgias or other medication SE's. Last lipids were  Lab Results  Component Value Date   CHOL 188 06/09/2016   HDL 54 06/09/2016   LDLCALC 101 06/09/2016   TRIG 166 (H) 06/09/2016   CHOLHDL 3.5 06/09/2016      Patient has T2_NIDDM since 2008 with preDM and A1c 6.3% and now patient has T2_NIDDM attempting management with diet and patient denies reactive hypoglycemic symptoms, visual blurring, diabetic polys or paresthesias. Last A1c was not at goal: Lab Results  Component Value Date   HGBA1C 7.1 (H) 06/09/2016       Finally, patient has history of Vitamin D Deficiency in 2008 of "25"  and last vitamin D was at goal: Lab Results  Component Value Date   VD25OH 32 06/09/2016   Current Outpatient Prescriptions on File Prior to Visit  Medication Sig  . amiodarone (PACERONE) 200 MG tablet Take 100 mg by mouth daily.   Marland Kitchen aspirin 81 MG tablet Take 81 mg by mouth daily.     . Cholecalciferol (VITAMIN D-3) 1000 UNITS CAPS Take 4,000 Units by mouth daily.  Marland Kitchen levothyroxine (SYNTHROID, LEVOTHROID) 88 MCG tablet TAKE ONE TABLET BY MOUTH ONCE DAILY BEFORE BREAKFAST  . quinapril (ACCUPRIL) 20 MG tablet TAKE ONE TABLET BY MOUTH ONCE DAILY  . rosuvastatin (CRESTOR) 10 MG tablet Take 5 mg by mouth daily.  Marland Kitchen warfarin (COUMADIN) 2.5 MG tablet TAKE AS DIRECTED BY COUMADIN  CLINIC.  Marland Kitchen doxazosin (CARDURA) 4 MG tablet Take 1 tablet at bedtime for prostate (Patient not taking: Reported on 10/01/2016)   No current facility-administered medications on file prior to visit.    No Known Allergies Past Medical History:  Diagnosis Date  . Atrial fibrillation (HCC)   . CORONARY ARTERY DISEASE   . HYPERTENSION   . Leg pain    a. ABI (05/2014):  ABIs falsely elevated due to calcification; TBIs ok; dopplers without significant stenosis  . MURMUR   . NEPHROLITHIASIS, HX OF   . SINUS BRADYCARDIA   . VITAMIN B12 DEFICIENCY    Health Maintenance  Topic Date Due  . OPHTHALMOLOGY EXAM  10/11/1933  . PNA vac Low Risk Adult (2 of 2 - PCV13) 01/17/2009  . TETANUS/TDAP  11/02/2014  . FOOT EXAM  04/11/2015  . HEMOGLOBIN A1C  12/10/2016  . INFLUENZA VACCINE  Completed  . ZOSTAVAX  Completed  Immunization History  Administered Date(s) Administered  . DT 12/03/2015  . Influenza Split 07/06/2013  . Influenza, High Dose Seasonal PF 08/09/2014, 07/25/2015, 08/20/2016  . Pneumococcal Polysaccharide-23 01/18/2008  . Td 11/02/2004  . Zoster 10/07/2009   Past Surgical History:  Procedure Laterality Date  . ANGIOPLASTY     Family History  Problem Relation Age of Onset  . Colon polyps    . Heart disease     Social History   Social History  . Marital status: Married    Spouse name: N/A  . Number of children: N/A  . Years of education: N/A   Occupational History  . 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 Smoking status: Never Smoker  .  Smokeless tobacco: Not on file  . Alcohol use No  . Drug use: No  . Sexual activity: Not on file    ROS Constitutional: Denies fever, chills, weight loss/gain, headaches, insomnia,  night sweats or change in appetite. Does c/o fatigue. Eyes: Denies redness, blurred vision, diplopia, discharge, itchy or watery eyes.  ENT: Denies discharge, congestion, post nasal drip, epistaxis, sore throat, earache, hearing loss, dental pain, Tinnitus, Vertigo, Sinus pain or snoring.  Cardio: Denies chest pain, palpitations, irregular heartbeat, syncope, dyspnea, diaphoresis, orthopnea, PND, claudication or edema Respiratory: denies cough, dyspnea, DOE, pleurisy, hoarseness, laryngitis or wheezing.  Gastrointestinal: Denies dysphagia, heartburn, reflux, water brash, pain, cramps, nausea, vomiting, bloating, diarrhea, constipation, hematemesis, melena, hematochezia, jaundice or hemorrhoids Genitourinary: Denies dysuria, frequency, urgency, nocturia, hesitancy, discharge, hematuria or flank pain Musculoskeletal: Denies arthralgia, myalgia, stiffness, Jt. Swelling, pain, limp or strain/sprain. Denies Falls. Skin: Denies puritis, rash, hives, warts, acne, eczema or change in skin lesion Neuro: No weakness, tremor, incoordination, spasms, paresthesia or pain Psychiatric: Denies confusion, memory loss or sensory loss. Denies Depression. Endocrine: Denies change in weight, skin, hair change, nocturia, and paresthesia, diabetic polys, visual blurring or hyper / hypo glycemic episodes.  Heme/Lymph: No excessive bleeding, bruising or enlarged lymph nodes.  Physical Exam  BP (!) 136/50   Pulse 64   Temp 97.7 F (36.5 C)   Resp 16   Ht 5' 7.5" (1.715 m)   Wt 159 lb 9.6 oz (72.4 kg)   BMI 24.63 kg/m   General Appearance: Well nourished, in no apparent distress.  Eyes: PERRLA, EOMs, conjunctiva no swelling or erythema, normal fundi and vessels. Sinuses: No frontal/maxillary tenderness ENT/Mouth: EACs patent /  TMs  nl. Nares clear without erythema, swelling, mucoid exudates. Oral hygiene is good. No erythema, swelling, or exudate. Tongue normal, non-obstructing. Tonsils not swollen or erythematous. Hearing normal.  Neck: Supple, thyroid normal. No bruits, nodes or JVD. Respiratory: Respiratory effort normal.  BS equal and clear bilateral without rales, rhonci, wheezing or stridor. Cardio: Heart sounds are normal with regular rate and rhythm and no murmurs, rubs or gallops. Peripheral pulses are normal and equal bilaterally without edema. No aortic or femoral bruits. Chest: symmetric with normal excursions and percussion.  Abdomen: Soft, with Nl bowel sounds. Nontender, no guarding, rebound, hernias, masses, or organomegaly.  Lymphatics: Non tender without lymphadenopathy.  Genitourinary: Deferred for age. Musculoskeletal: Full ROM all peripheral extremities, joint stability, 5/5 strength, and normal gait. Skin: Warm and dry without rashes, lesions, cyanosis, clubbing or  ecchymosis.  Neuro: Cranial nerves intact, reflexes equal bilaterally. Normal muscle tone, no cerebellar symptoms. Sensation intact to touch, vibratory and Monofilament to the toes bilaterally.  Pysch: Alert and oriented X 3 with normal affect, insight  and judgment appropriate.   Assessment and Plan  1. Annual Preventative/Screening Exam   - Microalbumin / creatinine urine ratio - EKG 12-Lead - US, RETROPERITNL ABD,  LTD - POC Hemoccult Bld/Stl  - Urinalysis, Routine w reflex microscopic  - PSA - HM DIABETES FOOT EXAM - LOW EXTREMITY NEUR EXAM DOCUM - CBC with Differential/Platelet - BASIC METABOLIC PANEL WITH GFR - Hepatic function panel - Magnesium - Lipid panel - TSH - Hemoglobin A1c - Insulin, random - VITAMIN D 25 Hydroxy   2. Essential hypertension  - Microalbumin / creatinine urine ratio - EKG 12-Lead - US, RETROPERITNL ABD,  LTD - TSH  3. Hyperlipidemia  - EKG 12-Lead - US, RETROPERITNL ABD,  LTD -  Lipid panel - TSH  4. Type 2 diabetes mellitus with stage 3 chronic kidney disease  (HCC)  - Microalbumin / creatinine urine ratio - HM DIABETES FOOT EXAM - LOW EXTREMITY NEUR EXAM DOCUM - Hemoglobin A1c - Insulin, random  5. Vitamin D deficiency  - VITAMIN D 25 Hydroxy   6. Gastroesophageal reflux disease   7. Atherosclerosis of native coronary artery of native heart without angina pectoris  - EKG 12-Lead  8. Atrial fibrillation (HCC)  - EKG 12-Lead  9. Screening for rectal cancer  - POC Hemoccult Bld/Stl   10. Prostate cancer screening  - PSA  11. Screening for ischemic heart disease  - EKG 12-Lead  12. Screening for AAA (aortic abdominal aneurysm)  - US, RETROPERITNL ABD,  LTD  13. Medication management  - Urinalysis, Routine w reflex microscopic  - CBC with Differential/Platelet - BASIC METABOLIC PANEL WITH GFR - Hepatic function panel - Magnesium      Continue prudent diet as discussed, weight control, BP monitoring, regular exercise, and medications as discussed.  Discussed med effects and SE's. Routine screening labs and tests as requested with regular follow-up as recommended. Over 40 minutes of exam, counseling, chart review and high complex critical decision making was performed

## 2016-10-02 ENCOUNTER — Ambulatory Visit (INDEPENDENT_AMBULATORY_CARE_PROVIDER_SITE_OTHER): Payer: Medicare Other | Admitting: *Deleted

## 2016-10-02 ENCOUNTER — Encounter: Payer: Self-pay | Admitting: Internal Medicine

## 2016-10-02 DIAGNOSIS — I4891 Unspecified atrial fibrillation: Secondary | ICD-10-CM

## 2016-10-02 DIAGNOSIS — Z8679 Personal history of other diseases of the circulatory system: Secondary | ICD-10-CM

## 2016-10-02 LAB — URINALYSIS, ROUTINE W REFLEX MICROSCOPIC
BILIRUBIN URINE: NEGATIVE
HGB URINE DIPSTICK: NEGATIVE
KETONES UR: NEGATIVE
Leukocytes, UA: NEGATIVE
Nitrite: NEGATIVE
Protein, ur: NEGATIVE
SPECIFIC GRAVITY, URINE: 1.016 (ref 1.001–1.035)
pH: 5.5 (ref 5.0–8.0)

## 2016-10-02 LAB — MICROALBUMIN / CREATININE URINE RATIO
CREATININE, URINE: 127 mg/dL (ref 20–370)
MICROALB UR: 3.9 mg/dL
Microalb Creat Ratio: 31 mcg/mg creat — ABNORMAL HIGH (ref <30)

## 2016-10-02 LAB — VITAMIN D 25 HYDROXY (VIT D DEFICIENCY, FRACTURES): VIT D 25 HYDROXY: 74 ng/mL (ref 30–100)

## 2016-10-02 LAB — TSH: TSH: 1.04 mIU/L (ref 0.40–4.50)

## 2016-10-02 LAB — INSULIN, RANDOM: Insulin: 35.8 u[IU]/mL — ABNORMAL HIGH (ref 2.0–19.6)

## 2016-10-02 LAB — HEMOGLOBIN A1C
HEMOGLOBIN A1C: 6.8 % — AB (ref <5.7)
MEAN PLASMA GLUCOSE: 148 mg/dL

## 2016-10-02 LAB — PSA: PSA: 2.3 ng/mL (ref <=4.0)

## 2016-10-02 LAB — POCT INR: INR: 3.1

## 2016-10-29 ENCOUNTER — Ambulatory Visit (INDEPENDENT_AMBULATORY_CARE_PROVIDER_SITE_OTHER): Payer: Medicare Other | Admitting: *Deleted

## 2016-10-29 DIAGNOSIS — I4891 Unspecified atrial fibrillation: Secondary | ICD-10-CM | POA: Diagnosis not present

## 2016-10-29 DIAGNOSIS — Z8679 Personal history of other diseases of the circulatory system: Secondary | ICD-10-CM | POA: Diagnosis not present

## 2016-10-29 LAB — POCT INR: INR: 2.7

## 2016-11-04 ENCOUNTER — Other Ambulatory Visit: Payer: Self-pay

## 2016-11-04 DIAGNOSIS — Z0001 Encounter for general adult medical examination with abnormal findings: Secondary | ICD-10-CM

## 2016-11-04 DIAGNOSIS — Z1212 Encounter for screening for malignant neoplasm of rectum: Secondary | ICD-10-CM

## 2016-11-04 LAB — POC HEMOCCULT BLD/STL (HOME/3-CARD/SCREEN)
FECAL OCCULT BLD: NEGATIVE
FECAL OCCULT BLD: NEGATIVE
Fecal Occult Blood, POC: NEGATIVE

## 2016-11-27 ENCOUNTER — Ambulatory Visit (INDEPENDENT_AMBULATORY_CARE_PROVIDER_SITE_OTHER): Payer: Medicare Other | Admitting: *Deleted

## 2016-11-27 DIAGNOSIS — Z8679 Personal history of other diseases of the circulatory system: Secondary | ICD-10-CM

## 2016-11-27 DIAGNOSIS — I4891 Unspecified atrial fibrillation: Secondary | ICD-10-CM | POA: Diagnosis not present

## 2016-11-27 LAB — POCT INR: INR: 2.6

## 2016-12-08 ENCOUNTER — Other Ambulatory Visit: Payer: Self-pay | Admitting: *Deleted

## 2016-12-08 MED ORDER — LEVOTHYROXINE SODIUM 88 MCG PO TABS: ORAL_TABLET | ORAL | 1 refills | Status: DC

## 2016-12-08 MED ORDER — QUINAPRIL HCL 20 MG PO TABS: 20.0000 mg | ORAL_TABLET | Freq: Every day | ORAL | 1 refills | Status: DC

## 2016-12-08 MED ORDER — AMIODARONE HCL 200 MG PO TABS: 100.0000 mg | ORAL_TABLET | Freq: Every day | ORAL | 0 refills | Status: DC

## 2016-12-29 ENCOUNTER — Ambulatory Visit (INDEPENDENT_AMBULATORY_CARE_PROVIDER_SITE_OTHER): Payer: Medicare Other | Admitting: *Deleted

## 2016-12-29 DIAGNOSIS — I4891 Unspecified atrial fibrillation: Secondary | ICD-10-CM | POA: Diagnosis not present

## 2016-12-29 DIAGNOSIS — Z8679 Personal history of other diseases of the circulatory system: Secondary | ICD-10-CM | POA: Diagnosis not present

## 2016-12-29 LAB — POCT INR: INR: 2.4

## 2017-01-07 ENCOUNTER — Encounter: Payer: Self-pay | Admitting: Internal Medicine

## 2017-01-07 ENCOUNTER — Ambulatory Visit (INDEPENDENT_AMBULATORY_CARE_PROVIDER_SITE_OTHER): Payer: Medicare Other | Admitting: Internal Medicine

## 2017-01-07 VITALS — BP 118/60 | HR 70 | Temp 97.8°F | Resp 16 | Ht 67.5 in | Wt 158.0 lb

## 2017-01-07 DIAGNOSIS — E1122 Type 2 diabetes mellitus with diabetic chronic kidney disease: Secondary | ICD-10-CM

## 2017-01-07 DIAGNOSIS — I10 Essential (primary) hypertension: Secondary | ICD-10-CM

## 2017-01-07 DIAGNOSIS — E782 Mixed hyperlipidemia: Secondary | ICD-10-CM

## 2017-01-07 DIAGNOSIS — N183 Chronic kidney disease, stage 3 unspecified: Secondary | ICD-10-CM

## 2017-01-07 DIAGNOSIS — R6889 Other general symptoms and signs: Secondary | ICD-10-CM

## 2017-01-07 DIAGNOSIS — Z7901 Long term (current) use of anticoagulants: Secondary | ICD-10-CM | POA: Diagnosis not present

## 2017-01-07 DIAGNOSIS — N320 Bladder-neck obstruction: Secondary | ICD-10-CM

## 2017-01-07 DIAGNOSIS — K219 Gastro-esophageal reflux disease without esophagitis: Secondary | ICD-10-CM | POA: Diagnosis not present

## 2017-01-07 DIAGNOSIS — I4891 Unspecified atrial fibrillation: Secondary | ICD-10-CM

## 2017-01-07 DIAGNOSIS — E559 Vitamin D deficiency, unspecified: Secondary | ICD-10-CM

## 2017-01-07 DIAGNOSIS — Z87442 Personal history of urinary calculi: Secondary | ICD-10-CM

## 2017-01-07 DIAGNOSIS — Z0001 Encounter for general adult medical examination with abnormal findings: Secondary | ICD-10-CM

## 2017-01-07 DIAGNOSIS — E039 Hypothyroidism, unspecified: Secondary | ICD-10-CM | POA: Diagnosis not present

## 2017-01-07 DIAGNOSIS — E538 Deficiency of other specified B group vitamins: Secondary | ICD-10-CM

## 2017-01-07 DIAGNOSIS — I2510 Atherosclerotic heart disease of native coronary artery without angina pectoris: Secondary | ICD-10-CM | POA: Diagnosis not present

## 2017-01-07 DIAGNOSIS — Z0000 Encounter for general adult medical examination without abnormal findings: Secondary | ICD-10-CM

## 2017-01-07 DIAGNOSIS — Z79899 Other long term (current) drug therapy: Secondary | ICD-10-CM | POA: Diagnosis not present

## 2017-01-07 LAB — BASIC METABOLIC PANEL WITH GFR
BUN: 33 mg/dL — AB (ref 7–25)
CO2: 25 mmol/L (ref 20–31)
CREATININE: 1.74 mg/dL — AB (ref 0.70–1.11)
Calcium: 8.8 mg/dL (ref 8.6–10.3)
Chloride: 107 mmol/L (ref 98–110)
GFR, EST AFRICAN AMERICAN: 39 mL/min — AB (ref >=60)
GFR, Est Non African American: 34 mL/min — ABNORMAL LOW (ref >=60)
Glucose, Bld: 121 mg/dL — ABNORMAL HIGH (ref 65–99)
POTASSIUM: 5.2 mmol/L (ref 3.5–5.3)
Sodium: 142 mmol/L (ref 135–146)

## 2017-01-07 LAB — HEPATIC FUNCTION PANEL
ALBUMIN: 3.8 g/dL (ref 3.6–5.1)
ALK PHOS: 57 U/L (ref 40–115)
ALT: 18 U/L (ref 9–46)
AST: 17 U/L (ref 10–35)
BILIRUBIN DIRECT: 0.1 mg/dL (ref <=0.2)
BILIRUBIN TOTAL: 0.5 mg/dL (ref 0.2–1.2)
Indirect Bilirubin: 0.4 mg/dL (ref 0.2–1.2)
Total Protein: 6.4 g/dL (ref 6.1–8.1)

## 2017-01-07 LAB — CBC WITH DIFFERENTIAL/PLATELET
BASOS PCT: 1 %
Basophils Absolute: 76 cells/uL (ref 0–200)
EOS ABS: 152 {cells}/uL (ref 15–500)
Eosinophils Relative: 2 %
HCT: 33.2 % — ABNORMAL LOW (ref 38.5–50.0)
Hemoglobin: 11 g/dL — ABNORMAL LOW (ref 13.2–17.1)
LYMPHS PCT: 21 %
Lymphs Abs: 1596 cells/uL (ref 850–3900)
MCH: 33.1 pg — ABNORMAL HIGH (ref 27.0–33.0)
MCHC: 33.1 g/dL (ref 32.0–36.0)
MCV: 100 fL (ref 80.0–100.0)
MPV: 9.3 fL (ref 7.5–12.5)
Monocytes Absolute: 1064 cells/uL — ABNORMAL HIGH (ref 200–950)
Monocytes Relative: 14 %
NEUTROS ABS: 4712 {cells}/uL (ref 1500–7800)
Neutrophils Relative %: 62 %
PLATELETS: 216 10*3/uL (ref 140–400)
RBC: 3.32 MIL/uL — ABNORMAL LOW (ref 4.20–5.80)
RDW: 15.1 % — AB (ref 11.0–15.0)
WBC: 7.6 10*3/uL (ref 3.8–10.8)

## 2017-01-07 LAB — LIPID PANEL
CHOL/HDL RATIO: 3.3 ratio (ref <5.0)
CHOLESTEROL: 175 mg/dL (ref <200)
HDL: 53 mg/dL (ref >40)
LDL Cholesterol: 99 mg/dL (ref <100)
Triglycerides: 117 mg/dL (ref <150)
VLDL: 23 mg/dL (ref <30)

## 2017-01-07 LAB — HEMOGLOBIN A1C
Hgb A1c MFr Bld: 6.7 % — ABNORMAL HIGH (ref <5.7)
Mean Plasma Glucose: 146 mg/dL

## 2017-01-07 LAB — TSH: TSH: 1.62 mIU/L (ref 0.40–4.50)

## 2017-01-07 MED ORDER — TRAMADOL HCL 50 MG PO TABS: 50.0000 mg | ORAL_TABLET | Freq: Four times a day (QID) | ORAL | 0 refills | Status: DC | PRN

## 2017-01-07 NOTE — Patient Instructions (Signed)
Rotator Cuff Injury Rotator cuff injury is any type of injury to the set of muscles and tendons that make up the stabilizing unit of your shoulder. This unit holds the ball of your upper arm bone (humerus) in the socket of your shoulder blade (scapula). What are the causes? Injuries to your rotator cuff most commonly come from sports or activities that cause your arm to be moved repeatedly over your head. Examples of this include throwing, weight lifting, swimming, or racquet sports. Long lasting (chronic) irritation of your rotator cuff can cause soreness and swelling (inflammation), bursitis, and eventual damage to your tendons, such as a tear (rupture). What are the signs or symptoms? Acute rotator cuff tear:  Sudden tearing sensation followed by severe pain shooting from your upper shoulder down your arm toward your elbow.  Decreased range of motion of your shoulder because of pain and muscle spasm.  Severe pain.  Inability to raise your arm out to the side because of pain and loss of muscle power (large tears). Chronic rotator cuff tear:  Pain that usually is worse at night and may interfere with sleep.  Gradual weakness and decreased shoulder motion as the pain worsens.  Decreased range of motion. Rotator cuff tendinitis:  Deep ache in your shoulder and the outside upper arm over your shoulder.  Pain that comes on gradually and becomes worse when lifting your arm to the side or turning it inward. How is this diagnosed? Rotator cuff injury is diagnosed through a medical history, physical exam, and imaging exam. The medical history helps determine the type of rotator cuff injury. Your health care provider will look at your injured shoulder, feel the injured area, and ask you to move your shoulder in different positions. X-ray exams typically are done to rule out other causes of shoulder pain, such as fractures. MRI is the exam of choice for the most severe shoulder injuries because the  images show muscles and tendons. How is this treated? Chronic tear:  Medicine for pain, such as acetaminophen or ibuprofen.  Physical therapy and range-of-motion exercises may be helpful in maintaining shoulder function and strength.  Steroid injections into your shoulder joint.  Surgical repair of the rotator cuff if the injury does not heal with noninvasive treatment. Acute tear:  Anti-inflammatory medicines such as ibuprofen and naproxen to help reduce pain and swelling.  A sling to help support your arm and rest your rotator cuff muscles. Long-term use of a sling is not advised. It may cause significant stiffening of the shoulder joint.  Surgery may be considered within a few weeks, especially in younger, active people, to return the shoulder to full function.  Indications for surgical treatment include the following:  Age younger than 60 years.  Rotator cuff tears that are complete.  Physical therapy, rest, and anti-inflammatory medicines have been used for 6-8 weeks, with no improvement.  Employment or sporting activity that requires constant shoulder use. Tendinitis:  Anti-inflammatory medicines such as ibuprofen and naproxen to help reduce pain and swelling.  A sling to help support your arm and rest your rotator cuff muscles. Long-term use of a sling is not advised. It may cause significant stiffening of the shoulder joint.  Severe tendinitis may require:  Steroid injections into your shoulder joint.  Physical therapy.  Surgery. Follow these instructions at home:  Apply ice to your injury:  Put ice in a plastic bag.  Place a towel between your skin and the bag.  Leave the ice on for   20 minutes, 2-3 times a day.  If you have a shoulder immobilizer (sling and straps), wear it until told otherwise by your health care provider.  You may want to sleep on several pillows or in a recliner at night to lessen swelling and pain.  Only take over-the-counter or  prescription medicines for pain, discomfort, or fever as directed by your health care provider.  Do simple hand squeezing exercises with a soft rubber ball to decrease hand swelling. Contact a health care provider if:  Your shoulder pain increases, or new pain or numbness develops in your arm, hand, or fingers.  Your hand or fingers are colder than your other hand. Get help right away if:  Your arm, hand, or fingers are numb or tingling.  Your arm, hand, or fingers are increasingly swollen and painful, or they turn white or blue. This information is not intended to replace advice given to you by your health care provider. Make sure you discuss any questions you have with your health care provider. Document Released: 10/16/2000 Document Revised: 03/26/2016 Document Reviewed: 05/31/2013 Elsevier Interactive Patient Education  2017 Elsevier Inc.  

## 2017-01-07 NOTE — Progress Notes (Signed)
MEDICARE ANNUAL WELLNESS VISIT AND FOLLOW UP Assessment:    1. Hypothyroidism, unspecified type -cont levothyroxine -dose adjust if necessary - TSH  2. Type 2 diabetes mellitus with stage 3 chronic kidney disease, without long-term current use of insulin (HCC) -cont diet and exercise -cont monitoring cbgs at home -cont current medications - Hemoglobin A1c  3. Medication management  - CBC with Differential/Platelet - BASIC METABOLIC PANEL WITH GFR - Hepatic function panel  4. Mixed hyperlipidemia -cont statin therapy - Lipid panel  5. Atrial fibrillation, unspecified type (HCC) -cont rate control per cardiology -on coumadin followed by coumadin clinic  6. Atherosclerosis of native coronary artery of native heart without angina pectoris -cont statin therapy -cont CBG control  7. Essential hypertension -well controlled -dash diet -exercise as tolerated -monitor at home  8. Gastroesophageal reflux disease, esophagitis presence not specified -cont medications -avoid trigger foods  9. Bladder neck obstruction -BPH -cont current medication doxazosin  10. CKD (chronic kidney disease) stage 3, GFR 30-59 ml/min -avoid NSAID therapy -monitor BMET  11. Medicare annual wellness visit -due next year  2012. Long term current use of anticoagulant therapy -cont coumadin -cardiology manages  13. NEPHROLITHIASIS, HX OF -avoid drinking tea -drink plenty of water  14. Vitamin B12 deficiency -Cont B12  15. Vitamin D deficiency -cont Vit D  16. Right shoulder pain -referral back to ortho -likely rotator cuff strain vs. tear -tramadol prn for pain  Over 30 minutes of exam, counseling, chart review, and critical decision making was performed  Future Appointments Date Time Provider Department Center  01/19/2017 10:45 AM Wendall StadePeter C Nishan, MD [facility]  01/26/2017 11:45 AM [facility]  04/16/2017 10:30 AM Lucky CowboyWilliam  McKeown, MD [facility] None  10/29/2017 10:00 AM Lucky CowboyWilliam McKeown, MD [facility] None     Plan:   During the course of the visit the patient was educated and counseled about appropriate screening and preventive services including:    Pneumococcal vaccine   Influenza vaccine  Prevnar 13  Td vaccine  Screening electrocardiogram  Colorectal cancer screening  Diabetes screening  Glaucoma screening  Nutrition counseling    Subjective:  Adam [name] is a 83 y.o. male who presents for Medicare Annual Wellness Visit and 3 month follow up for HTN, hyperlipidemia, prediabetes, and vitamin D Def.   His blood pressure has been controlled at home, today their BP is BP: 118/60 He does not workout. He denies chest pain, shortness of breath, dizziness.  He hardly ever checks his pressure at home.  He reports that he has been checking his wifes pressures.  She has dementia.     He is on cholesterol medication and denies myalgias. His cholesterol is not at goal. The cholesterol last visit was:   Lab Results  Component Value Date   CHOL 168 10/01/2016   HDL 46 10/01/2016   LDLCALC 101 (H) 10/01/2016   TRIG 107 10/01/2016   CHOLHDL 3.7 10/01/2016   He has a history of diabetes with A1c at goal of less than 7.0.  He is taking medications.  No hypoglycemic states.   :  Lab Results  Component Value Date   HGBA1C 6.8 (H) 10/01/2016   Last GFR Lab Results  Component Value Date   GFRNONAA 33 (L) 10/01/2016     Lab Results  Component Value Date   GFRAA 38 (L) 10/01/2016   Patient is on Vitamin D supplement.   Lab Results  Component Value Date   VD25OH 74 10/01/2016  Patient reports that his right shoulder has been bothersome to him.  He reports that he did go to [facility] and had a normal xray.  He reports that it was improving, but he went to break a stick last week.  He reports that he had an immediate pain.  He reports that he is having a hard time lifting his arm.  He  reports that he can't lift it on his own.   He did not see a defect in his arm.  He did not have any popping at all.  He is right hand dominant.     Medication Review: Current Outpatient Prescriptions on File Prior to Visit  Medication Sig Dispense Refill  . amiodarone (PACERONE) 200 MG tablet Take 0.5 tablets (100 mg total) by mouth daily. 90 tablet 0  . aspirin 81 MG tablet Take 81 mg by mouth daily.      . Cholecalciferol (VITAMIN D-3) 1000 UNITS CAPS Take 4,000 Units by mouth daily.    Marland Kitchen doxazosin (CARDURA) 4 MG tablet Take 1 tablet at bedtime for prostate 90 tablet 1  . levothyroxine (SYNTHROID, LEVOTHROID) 88 MCG tablet TAKE ONE TABLET BY MOUTH ONCE DAILY BEFORE BREAKFAST 90 tablet 1  . quinapril (ACCUPRIL) 20 MG tablet Take 1 tablet (20 mg total) by mouth daily. 90 tablet 1  . rosuvastatin (CRESTOR) 10 MG tablet Take 5 mg by mouth daily.    Marland Kitchen warfarin (COUMADIN) 2.5 MG tablet TAKE AS DIRECTED BY COUMADIN  CLINIC. 100 tablet 1   No current facility-administered medications on file prior to visit.     Allergies: No Known Allergies  Current Problems (verified) has Vitamin B12 deficiency; Essential hypertension; Coronary atherosclerosis; NEPHROLITHIASIS, HX OF; Atrial fibrillation (HCC); Type II diabetes mellitus with renal manifestations; Hyperlipidemia; Vitamin D deficiency; Medication management; CKD (chronic kidney disease) stage 3, GFR 30-59 ml/min; GERD (gastroesophageal reflux disease); Hypothyroidism; Initial Medicare annual wellness visit; Long term current use of anticoagulant therapy; and Bladder neck obstruction on his problem list.  Screening Tests Immunization History  Administered Date(s) Administered  . DT 12/03/2015  . Influenza Split 07/06/2013  . Influenza, High Dose Seasonal PF 08/09/2014, 07/25/2015, 08/20/2016  . Pneumococcal Polysaccharide-23 01/18/2008  . Td 11/02/2004  . Zoster 10/07/2009    Preventative care: Last colonoscopy: 2007  Names of Other  Physician/Practitioners you currently use: 1. [facility] here for primary care 2. Dr. Elmer Picker, eye doctor, last visit 2016 3. Dr.Benson , dentist, last visit 2018 Patient Care Team: Lucky Cowboy, MD as PCP - General (Internal Medicine) Wendall Stade, MD as Consulting Physician (Cardiology) Mateo Flow, MD as Consulting Physician (Ophthalmology)  Surgical: He  has a past surgical history that includes Angioplasty. Family His family history is not on file. Social history  He reports that he has never smoked. He has never used smokeless tobacco. He reports that he does not drink alcohol or use drugs.  MEDICARE WELLNESS OBJECTIVES: Physical activity:   Cardiac risk factors:   Depression/mood screen:   Depression screen [facility] 2/9 01/07/2017  Decreased Interest 0  Down, Depressed, Hopeless 0  PHQ - 2 Score 0    ADLs:  In your present state of health, do you have any difficulty performing the following activities: 01/07/2017 10/02/2016  Hearing? Y Y  Vision? - N  Difficulty concentrating or making decisions? - N  Walking or climbing stairs? - N  Dressing or bathing? - N  Doing errands, shopping? - N  Some recent data  might be hidden     Cognitive Testing  Alert? Yes  Normal Appearance?Yes  Oriented to person? Yes  Place? Yes   Time? Yes  Recall of three objects?  Yes  Can perform simple calculations? Yes  Displays appropriate judgment?Yes  Can read the correct time from a watch face?Yes  EOL planning: Does Patient Have a Medical Advance Directive?: Yes Would patient like information on creating a medical advance directive?: Yes (MAU/Ambulatory/Procedural Areas - Information given)   Objective:   Today's Vitals   01/07/17 1032  BP: 118/60  Pulse: 70  Resp: 16  Temp: 97.8 F (36.6 C)  TempSrc: Temporal  Weight: 158 lb (71.7 kg)  Height: 5' 7.5" (1.715 m)   Body mass index is 24.38 kg/m.  General appearance: alert, no  distress, WD/WN, male HEENT: normocephalic, sclerae anicteric, TMs pearly, nares patent, no discharge or erythema, pharynx normal Oral cavity: MMM, no lesions Neck: supple, no lymphadenopathy, no thyromegaly, no masses Heart: RRR, normal S1, S2, no murmurs Lungs: CTA bilaterally, no wheezes, rhonchi, or rales Abdomen: +bs, soft, non tender, non distended, no masses, no hepatomegaly, no splenomegaly Musculoskeletal: nontender, no swelling, no obvious deformity.  Right shoulder without visible deformity.  There is limited active ROM with abduction, and internal and external rotation secondary to pain.  3/5 strength with abduction.  3/5 forward flexion.  4/5 internal and external rotation.  Positive empty can.  Positive speeds test.   Extremities: no edema, no cyanosis, no clubbing Pulses: 2+ symmetric, upper and lower extremities, normal cap refill Neurological: alert, oriented x 3, CN2-12 intact, strength normal upper extremities and lower extremities, sensation normal throughout, DTRs 2+ throughout, no cerebellar signs, gait normal Psychiatric: normal affect, behavior normal, pleasant   Medicare Attestation I have personally reviewed: The patient's medical and social history Their use of alcohol, tobacco or illicit drugs Their current medications and supplements The patient's functional ability including ADLs,fall risks, home safety risks, cognitive, and hearing and visual impairment Diet and physical activities Evidence for depression or mood disorders  The patient's weight, height, BMI, and visual acuity have been recorded in the chart.  I have made referrals, counseling, and provided education to the patient based on review of the above and I have provided the patient with a written personalized care plan for preventive services.     Terri Piedra, PA-C   01/07/2017

## 2017-01-11 NOTE — Progress Notes (Signed)
Patient ID: Adam [name], male   DOB: 07/19/1923, 83 y.o.   MRN: [id number]   Cardiology Office Note    Date:  01/19/2017   ID:  Adam [name], DOB 09/23/1923, MRN [id number]  PCP:  Adam CorwinMCKEOWN,Adam DAVID, MD  Cardiologist:  Dr. Charlton HawsPeter Williom [name]      History of Present Illness: Adam [name] is a 83 y.o. male with a history of CAD status post multiple PCI procedures in the past, angioplasty to the RCA in 1995, paroxysmal atrial fibrillation (failed sotalol and started on amiodarone in 2001), HTN, HL, CKD.Adam [name].  Saw PA last year for ? Of claudication   F/U ABI's reviewed:  05/07/14  Non compressable but normal TBI and normal arterial duplex  Studies:  - LHC (08/1999):  Report not available- reported PDA 90%; medical therapy planned  - abdominal US (03/2011): No AAA  BP low in office  August 2016  and norvasc stopped quinapril decreased.   Improved now   Echo 06/13/15 reviewed normal EF moderate AS  Todays echo more mild with mean gradient 10 mmHg  Study Conclusions  - Left ventricle: The cavity size was normal. There was mild concentric hypertrophy. Systolic function was normal. The estimated ejection fraction was in the range of 60% to 65%. Wall motion was normal; there were no regional wall motion abnormalities. Doppler parameters are consistent with abnormal left ventricular relaxation (grade 1 diastolic dysfunction). The E/e&' ratio is >15, suggesting elevated LV filling pressure. - Aortic valve: Mildly calcified leaflets. Moderate aortic stenosis. There was trivial regurgitation. Mean gradient (S): 15 mm Hg. Peak gradient (S): 24 mm Hg. Valve area (VTI): 1.26 cm^2. Valve area (Vmax): 1.24 cm^2. Valve area (Vmean): 1.2 cm^2. - Mitral valve: Calcified annulus. Mildly thickened leaflets . There was mild regurgitation. - Left atrium: The atrium was normal in size. - Right atrium: The atrium was mildly dilated. - Tricuspid valve: There was mild  regurgitation. - Pulmonary arteries: PA peak pressure: 28 mm Hg (S). - Inferior vena cava: The vessel was normal in size. The respirophasic diameter changes were in the normal range (>= 50%), consistent with normal central venous pressure.   Active No angina, palpitations, syncope or dyspnea Compliant with meds   Recent Labs: 01/07/2017: ALT 18; Creat 1.74; HDL 53; Hemoglobin 11.0; LDL Cholesterol 99; Potassium 5.2; TSH 1.62  Wt Readings from Last 3 Encounters:  01/19/17 159 lb 8 oz (72.3 kg)  01/07/17 158 lb (71.7 kg)  10/01/16 159 lb 9.6 oz (72.4 kg)     Past Medical History:  Diagnosis Date  . Atrial fibrillation (HCC)   . CORONARY ARTERY DISEASE   . HYPERTENSION   . Leg pain    a. ABI (05/2014):  ABIs falsely elevated due to calcification; TBIs ok; dopplers without significant stenosis  . MURMUR   . NEPHROLITHIASIS, HX OF   . SINUS BRADYCARDIA   . VITAMIN B12 DEFICIENCY     Current Outpatient Prescriptions  Medication Sig Dispense Refill  . amiodarone (PACERONE) 200 MG tablet Take 0.5 tablets (100 mg total) by mouth daily. 90 tablet 0  . aspirin 81 MG tablet Take 81 mg by mouth daily.      . Cholecalciferol (VITAMIN D-3) 1000 UNITS CAPS Take 4,000 Units by mouth daily.    Marland Kitchen. doxazosin (CARDURA) 4 MG tablet Take 1 tablet at bedtime for prostate 90 tablet 1  . levothyroxine (SYNTHROID, LEVOTHROID) 88 MCG tablet TAKE ONE TABLET BY MOUTH ONCE DAILY BEFORE BREAKFAST 90 tablet 1  .  quinapril (ACCUPRIL) 20 MG tablet Take 1 tablet (20 mg total) by mouth daily. 90 tablet 1  . rosuvastatin (CRESTOR) 10 MG tablet Take 5 mg by mouth daily.    . traMADol (ULTRAM) 50 MG tablet Take 1 tablet (50 mg total) by mouth every 6 (six) hours as needed. 60 tablet 0  . warfarin (COUMADIN) 2.5 MG tablet TAKE AS DIRECTED BY COUMADIN  CLINIC. 100 tablet 1   No current facility-administered medications for this visit.     Allergies:   Patient has no known allergies.   Social History:  The  patient  reports that he has never smoked. He has never used smokeless tobacco. He reports that he does not drink alcohol or use drugs.   Family History:  The patient's family history is not on file.   ROS:  Please see the history of present illness.      All other systems reviewed and negative.   PHYSICAL EXAM: VS:  BP 118/78   Pulse 70   Ht 5\' 7"  (1.702 m)   Wt 159 lb 8 oz (72.3 kg)   SpO2 93%   BMI 24.98 kg/m  Well nourished, well developed, in no acute distress  HEENT: normal  Neck: no JVD  Cardiac:  normal S1, S2; RRR; 2/6 systolic murmur at the RUSB and holosystolic at apex  Lungs:  clear to auscultation bilaterally, no wheezing, rhonchi or rales  Abd: soft, nontender, no hepatomegaly  Ext: no edema  Vascular: Popliteal, PT, DP 2+ on the right; trace to 1+ on the left Skin: warm and dry echymosis RLE from accident  Neuro:  CNs 2-12 intact, no focal abnormalities noted  EKG:  05/28/15   Sinus bradycardia, HR 59, LAD, first degree AV block (PR 214 ms), nonspecific ST-T wave changes, no significant change when compared to prior tracing, QTc 431     ASSESSMENT AND PLAN:  1. Pain of lower extremity, doubt claudication   His symptoms are somewhat atypical. ABI's normal . 2. CAD :  Continue aspirin, statin. No angina. 3. Paroxysmal atrial fibrillation:  Maintaining NSR. Continue Coumadin, amiodarone.  Recent LFTs and TSH               okay. 4. Hypertension:  BP low stop norvasc and cut quinipril to 20 mg f/u next available  5. Hyperlipidemia:  Recent LDL optimal. Continue statin.  6. Murmur:  Reviewed echo 01/31/16 mild to moderate AS mean gradient 12 mmHg and moderate MR  f/u echo ordered murmur seems like moderate AS   7.        DM Discussed low carb diet.  Target hemoglobin A1c is 6.5 or less.  Continue current medications.  Lab Results  Component Value Date   HGBA1C 6.7 (H) 01/07/2017      Adam Haws

## 2017-01-12 ENCOUNTER — Encounter: Payer: Self-pay | Admitting: Cardiovascular Disease

## 2017-01-19 ENCOUNTER — Encounter: Payer: Self-pay | Admitting: Cardiovascular Disease

## 2017-01-19 ENCOUNTER — Ambulatory Visit (INDEPENDENT_AMBULATORY_CARE_PROVIDER_SITE_OTHER): Payer: Medicare Other | Admitting: Cardiovascular Disease

## 2017-01-19 VITALS — BP 118/78 | HR 70 | Ht 67.0 in | Wt 159.5 lb

## 2017-01-19 DIAGNOSIS — I350 Nonrheumatic aortic (valve) stenosis: Secondary | ICD-10-CM | POA: Diagnosis not present

## 2017-01-19 NOTE — Patient Instructions (Addendum)

## 2017-01-26 ENCOUNTER — Ambulatory Visit (INDEPENDENT_AMBULATORY_CARE_PROVIDER_SITE_OTHER): Payer: Medicare Other | Admitting: *Deleted

## 2017-01-26 DIAGNOSIS — I4891 Unspecified atrial fibrillation: Secondary | ICD-10-CM | POA: Diagnosis not present

## 2017-01-26 DIAGNOSIS — Z8679 Personal history of other diseases of the circulatory system: Secondary | ICD-10-CM

## 2017-01-26 LAB — POCT INR: INR: 2.7

## 2017-02-04 ENCOUNTER — Other Ambulatory Visit: Payer: Self-pay

## 2017-02-04 ENCOUNTER — Ambulatory Visit (HOSPITAL_COMMUNITY): Payer: Medicare Other | Attending: Cardiovascular Disease

## 2017-02-04 DIAGNOSIS — I350 Nonrheumatic aortic (valve) stenosis: Secondary | ICD-10-CM

## 2017-02-04 DIAGNOSIS — I2510 Atherosclerotic heart disease of native coronary artery without angina pectoris: Secondary | ICD-10-CM | POA: Diagnosis not present

## 2017-02-04 DIAGNOSIS — I480 Paroxysmal atrial fibrillation: Secondary | ICD-10-CM | POA: Diagnosis not present

## 2017-02-04 DIAGNOSIS — N189 Chronic kidney disease, unspecified: Secondary | ICD-10-CM | POA: Diagnosis not present

## 2017-02-04 DIAGNOSIS — I083 Combined rheumatic disorders of mitral, aortic and tricuspid valves: Secondary | ICD-10-CM | POA: Insufficient documentation

## 2017-02-04 MED ORDER — PERFLUTREN LIPID MICROSPHERE
1.0000 mL | INTRAVENOUS | Status: AC | PRN
  Administered 2017-02-04: 2 mL via INTRAVENOUS

## 2017-02-08 ENCOUNTER — Other Ambulatory Visit: Payer: Self-pay | Admitting: *Deleted

## 2017-02-08 MED ORDER — ATORVASTATIN CALCIUM 40 MG PO TABS: 40.0000 mg | ORAL_TABLET | Freq: Every day | ORAL | 1 refills | Status: DC

## 2017-02-16 ENCOUNTER — Other Ambulatory Visit: Payer: Self-pay

## 2017-02-16 MED ORDER — WARFARIN SODIUM 2.5 MG PO TABS: ORAL_TABLET | ORAL | 1 refills | Status: DC

## 2017-02-23 ENCOUNTER — Ambulatory Visit (INDEPENDENT_AMBULATORY_CARE_PROVIDER_SITE_OTHER): Payer: Medicare Other | Admitting: *Deleted

## 2017-02-23 DIAGNOSIS — Z8679 Personal history of other diseases of the circulatory system: Secondary | ICD-10-CM

## 2017-02-23 DIAGNOSIS — I4891 Unspecified atrial fibrillation: Secondary | ICD-10-CM

## 2017-02-23 LAB — POCT INR: INR: 3

## 2017-04-06 ENCOUNTER — Ambulatory Visit (INDEPENDENT_AMBULATORY_CARE_PROVIDER_SITE_OTHER): Payer: Medicare Other | Admitting: *Deleted

## 2017-04-06 DIAGNOSIS — Z8679 Personal history of other diseases of the circulatory system: Secondary | ICD-10-CM

## 2017-04-06 DIAGNOSIS — I4891 Unspecified atrial fibrillation: Secondary | ICD-10-CM

## 2017-04-06 LAB — POCT INR: INR: 2.4

## 2017-04-16 ENCOUNTER — Ambulatory Visit (INDEPENDENT_AMBULATORY_CARE_PROVIDER_SITE_OTHER): Payer: Medicare Other | Admitting: Internal Medicine

## 2017-04-16 ENCOUNTER — Encounter: Payer: Self-pay | Admitting: Internal Medicine

## 2017-04-16 VITALS — BP 124/52 | HR 62 | Temp 97.2°F | Resp 16 | Ht 67.0 in | Wt 153.2 lb

## 2017-04-16 DIAGNOSIS — I482 Chronic atrial fibrillation, unspecified: Secondary | ICD-10-CM

## 2017-04-16 DIAGNOSIS — E782 Mixed hyperlipidemia: Secondary | ICD-10-CM

## 2017-04-16 DIAGNOSIS — E1122 Type 2 diabetes mellitus with diabetic chronic kidney disease: Secondary | ICD-10-CM | POA: Diagnosis not present

## 2017-04-16 DIAGNOSIS — N183 Chronic kidney disease, stage 3 (moderate): Secondary | ICD-10-CM

## 2017-04-16 DIAGNOSIS — E559 Vitamin D deficiency, unspecified: Secondary | ICD-10-CM | POA: Diagnosis not present

## 2017-04-16 DIAGNOSIS — I10 Essential (primary) hypertension: Secondary | ICD-10-CM

## 2017-04-16 DIAGNOSIS — Z79899 Other long term (current) drug therapy: Secondary | ICD-10-CM | POA: Diagnosis not present

## 2017-04-16 LAB — CBC WITH DIFFERENTIAL/PLATELET
BASOS PCT: 1 %
Basophils Absolute: 62 cells/uL (ref 0–200)
EOS PCT: 2 %
Eosinophils Absolute: 124 cells/uL (ref 15–500)
HCT: 35.4 % — ABNORMAL LOW (ref 38.5–50.0)
HEMOGLOBIN: 11.5 g/dL — AB (ref 13.2–17.1)
LYMPHS ABS: 1612 {cells}/uL (ref 850–3900)
Lymphocytes Relative: 26 %
MCH: 32.8 pg (ref 27.0–33.0)
MCHC: 32.5 g/dL (ref 32.0–36.0)
MCV: 100.9 fL — ABNORMAL HIGH (ref 80.0–100.0)
MPV: 9.2 fL (ref 7.5–12.5)
Monocytes Absolute: 682 cells/uL (ref 200–950)
Monocytes Relative: 11 %
NEUTROS ABS: 3720 {cells}/uL (ref 1500–7800)
Neutrophils Relative %: 60 %
Platelets: 228 10*3/uL (ref 140–400)
RBC: 3.51 MIL/uL — AB (ref 4.20–5.80)
RDW: 14 % (ref 11.0–15.0)
WBC: 6.2 10*3/uL (ref 3.8–10.8)

## 2017-04-16 LAB — LIPID PANEL
CHOL/HDL RATIO: 3.2 ratio (ref <5.0)
CHOLESTEROL: 174 mg/dL (ref <200)
HDL: 54 mg/dL (ref >40)
LDL Cholesterol: 93 mg/dL (ref <100)
TRIGLYCERIDES: 134 mg/dL (ref <150)
VLDL: 27 mg/dL (ref <30)

## 2017-04-16 LAB — HEPATIC FUNCTION PANEL
ALK PHOS: 63 U/L (ref 40–115)
ALT: 18 U/L (ref 9–46)
AST: 17 U/L (ref 10–35)
Albumin: 4 g/dL (ref 3.6–5.1)
BILIRUBIN DIRECT: 0.1 mg/dL (ref <=0.2)
Indirect Bilirubin: 0.3 mg/dL (ref 0.2–1.2)
Total Bilirubin: 0.4 mg/dL (ref 0.2–1.2)
Total Protein: 6.2 g/dL (ref 6.1–8.1)

## 2017-04-16 LAB — BASIC METABOLIC PANEL WITH GFR
BUN: 37 mg/dL — ABNORMAL HIGH (ref 7–25)
CALCIUM: 9 mg/dL (ref 8.6–10.3)
CO2: 25 mmol/L (ref 20–31)
Chloride: 103 mmol/L (ref 98–110)
Creat: 2.22 mg/dL — ABNORMAL HIGH (ref 0.70–1.11)
GFR, EST AFRICAN AMERICAN: 29 mL/min — AB (ref >=60)
GFR, EST NON AFRICAN AMERICAN: 25 mL/min — AB (ref >=60)
Glucose, Bld: 138 mg/dL — ABNORMAL HIGH (ref 65–99)
POTASSIUM: 4.6 mmol/L (ref 3.5–5.3)
SODIUM: 140 mmol/L (ref 135–146)

## 2017-04-16 LAB — TSH: TSH: 2 mIU/L (ref 0.40–4.50)

## 2017-04-16 NOTE — Patient Instructions (Signed)

## 2017-04-16 NOTE — Progress Notes (Signed)
This very nice 83 y.o. MWM presents for 3 month follow up with Hypertension, ASHD/cAfib, Hyperlipidemia,  T2_NIDDM and Vitamin D Deficiency. Patient also has GERD controlled with diet & meds.      Patient is treated for HTN (1970's) & BP has been controlled at home. Today's BP is at goal - 124/52. Patient had PCA/Stents x 2 in 1990 and 2000 and is on coumadin for cAfib (2000) [facility].  Patient is physically active and played golf 2 x this week. Patient has had no complaints of any cardiac type chest pain, palpitations, dyspnea/orthopnea/PND, dizziness, claudication, or dependent edema.     Hyperlipidemia is controlled with diet & meds. Patient denies myalgias or other med SE's. Last Lipids were at goal: Lab Results  Component Value Date   CHOL 175 01/07/2017   HDL 53 01/07/2017   LDLCALC 99 01/07/2017   TRIG 117 01/07/2017   CHOLHDL 3.3 01/07/2017      Also, the patient has history of PreDiabetes (A1c 6.3% 2008) til dx'd with T2_DM (A1c 6.5% in Mar 2016) and has had no symptoms of reactive hypoglycemia, diabetic polys, paresthesias or visual blurring.  Last A1c was  Lab Results  Component Value Date   HGBA1C 6.7 (H) 01/07/2017      Further, the patient also has history of Vitamin D Deficiency and supplements vitamin D without any suspected side-effects. Last vitamin D was at goal:   Lab Results  Component Value Date   VD25OH 74 10/01/2016   Current Outpatient Prescriptions on File Prior to Visit  Medication Sig  . amiodarone (PACERONE) 200 MG tablet Take 0.5 tablets (100 mg total) by mouth daily.  Marland Kitchen. aspirin 81 MG tablet Take 81 mg by mouth daily.    Marland Kitchen. atorvastatin (LIPITOR) 40 MG tablet Take 1 tablet (40 mg total) by mouth daily.  . Cholecalciferol (VITAMIN D-3) 1000 UNITS CAPS Take 4,000 Units by mouth daily.  Marland Kitchen. levothyroxine (SYNTHROID, LEVOTHROID) 88 MCG tablet TAKE ONE TABLET BY MOUTH ONCE DAILY BEFORE BREAKFAST  . quinapril (ACCUPRIL) 20 MG tablet  Take 1 tablet (20 mg total) by mouth daily.  . rosuvastatin (CRESTOR) 10 MG tablet Take 5 mg by mouth daily.  . traMADol (ULTRAM) 50 MG tablet Take 1 tablet (50 mg total) by mouth every 6 (six) hours as needed.  . warfarin (COUMADIN) 2.5 MG tablet TAKE AS DIRECTED BY COUMADIN  CLINIC.  Marland Kitchen. doxazosin (CARDURA) 4 MG tablet Take 1 tablet at bedtime for prostate   No current facility-administered medications on file prior to visit.    No Known Allergies PMHx:   Past Medical History:  Diagnosis Date  . Atrial fibrillation (HCC)   . CORONARY ARTERY DISEASE   . HYPERTENSION   . Leg pain    a. ABI (05/2014):  ABIs falsely elevated due to calcification; TBIs ok; dopplers without significant stenosis  . MURMUR   . NEPHROLITHIASIS, HX OF   . SINUS BRADYCARDIA   . VITAMIN B12 DEFICIENCY    Immunization History  Administered Date(s) Administered  . DT 12/03/2015  . Influenza Split 07/06/2013  . Influenza, High Dose Seasonal PF 08/09/2014, 07/25/2015, 08/20/2016  . Pneumococcal Polysaccharide-23 01/18/2008  . Td 11/02/2004  . Zoster 10/07/2009   Past Surgical History:  Procedure Laterality Date  . ANGIOPLASTY     FHx:    Reviewed / unchanged  SHx:    Reviewed / unchanged  Systems Review:  Constitutional: Denies fever, chills, wt changes, headaches, insomnia,  fatigue, night sweats, change in appetite. Eyes: Denies redness, blurred vision, diplopia, discharge, itchy, watery eyes.  ENT: Denies discharge, congestion, post nasal drip, epistaxis, sore throat, earache, hearing loss, dental pain, tinnitus, vertigo, sinus pain, snoring.  CV: Denies chest pain, palpitations, irregular heartbeat, syncope, dyspnea, diaphoresis, orthopnea, PND, claudication or edema. Respiratory: denies cough, dyspnea, DOE, pleurisy, hoarseness, laryngitis, wheezing.  Gastrointestinal: Denies dysphagia, odynophagia, heartburn, reflux, water brash, abdominal pain or cramps, nausea, vomiting, bloating, diarrhea,  constipation, hematemesis, melena, hematochezia  or hemorrhoids. Genitourinary: Denies dysuria, frequency, urgency, nocturia, hesitancy, discharge, hematuria or flank pain. Musculoskeletal: Denies arthralgias, myalgias, stiffness, jt. swelling, pain, limping or strain/sprain.  Skin: Denies pruritus, rash, hives, warts, acne, eczema or change in skin lesion(s). Neuro: No weakness, tremor, incoordination, spasms, paresthesia or pain. Psychiatric: Denies confusion, memory loss or sensory loss. Endo: Denies change in weight, skin or hair change.  Heme/Lymph: No excessive bleeding, bruising or enlarged lymph nodes.  Physical Exam  BP (!) 124/52   Pulse 62   Temp 97.2 F (36.2 C)   Resp 16   Ht 5\' 7"  (1.702 m)   Wt 153 lb 3.2 oz (69.5 kg)   BMI 23.99 kg/m   Appears well nourished, well groomed  and in no distress.  Eyes: PERRLA, EOMs, conjunctiva no swelling or erythema. Sinuses: No frontal/maxillary tenderness ENT/Mouth: EAC's clear, TM's nl w/o erythema, bulging. Nares clear w/o erythema, swelling, exudates. Oropharynx clear without erythema or exudates. Oral hygiene is good. Tongue normal, non obstructing. Hearing intact.  Neck: Supple. Thyroid nl. Car 2+/2+ without bruits, nodes or JVD. Chest: Respirations nl with BS clear & equal w/o rales, rhonchi, wheezing or stridor.  Cor: Heart sounds normal w/ regular rate and rhythm without sig. murmurs, gallops, clicks or rubs. Peripheral pulses normal and equal  without edema.  Abdomen: Soft & bowel sounds normal. Non-tender w/o guarding, rebound, hernias, masses or organomegaly.  Lymphatics: Unremarkable.  Musculoskeletal: Full ROM all peripheral extremities, joint stability, 5/5 strength and normal gait.  Skin: Warm, dry without exposed rashes, lesions or ecchymosis apparent.  Neuro: Cranial nerves intact, reflexes equal bilaterally. Sensory-motor testing grossly intact. Tendon reflexes grossly intact.  Pysch: Alert & oriented x 3.  Insight  and judgement nl & appropriate. No ideations.  Assessment and Plan:  1. Essential hypertension  - Continue medication, monitor blood pressure at home.  - Continue DASH diet. Reminder to go to the ER if any CP,  SOB, nausea, dizziness, severe HA, changes vision/speech,  left arm numbness and tingling and jaw pain.  - CBC with Differential/Platelet - BASIC METABOLIC PANEL WITH GFR - Magnesium - TSH  2. Hyperlipidemia, mixed  - Continue diet/meds, exercise,& lifestyle modifications.  - Continue monitor periodic cholesterol/liver & renal functions   - Hepatic function panel - Lipid panel - TSH  3. Type 2 diabetes mellitus with stage 3 chronic kidney disease, without long-term current use of insulin (HCC)  - Continue diet, exercise, lifestyle modifications.  - Monitor appropriate labs.  - Hemoglobin A1c - Insulin, random  4. Vitamin D deficiency  - Continue supplementation.  - VITAMIN D 25 Hydroxy  5. Chronic atrial fibrillation (HCC)   6. Medication management  - CBC with Differential/Platelet - BASIC METABOLIC PANEL WITH GFR - Hepatic function panel - Magnesium - Lipid panel - TSH - Hemoglobin A1c - Insulin, random - VITAMIN D 25 Hydroxy       Discussed  regular exercise, BP monitoring, weight control to achieve/maintain BMI less than 25 and discussed med and  SE's. Recommended labs to assess and monitor clinical status with further disposition pending results of labs. Over 30 minutes of exam, counseling, chart review was performed.

## 2017-04-17 ENCOUNTER — Other Ambulatory Visit: Payer: Self-pay | Admitting: Internal Medicine

## 2017-04-17 DIAGNOSIS — Z79899 Other long term (current) drug therapy: Secondary | ICD-10-CM

## 2017-04-17 DIAGNOSIS — E1122 Type 2 diabetes mellitus with diabetic chronic kidney disease: Secondary | ICD-10-CM

## 2017-04-17 DIAGNOSIS — N184 Chronic kidney disease, stage 4 (severe): Principal | ICD-10-CM

## 2017-04-17 LAB — MAGNESIUM: Magnesium: 1.9 mg/dL (ref 1.5–2.5)

## 2017-04-17 LAB — HEMOGLOBIN A1C
HEMOGLOBIN A1C: 7.2 % — AB (ref <5.7)
Mean Plasma Glucose: 160 mg/dL

## 2017-04-17 LAB — VITAMIN D 25 HYDROXY (VIT D DEFICIENCY, FRACTURES): VIT D 25 HYDROXY: 69 ng/mL (ref 30–100)

## 2017-04-17 MED ORDER — METFORMIN HCL ER 500 MG PO TB24: ORAL_TABLET | ORAL | 1 refills | Status: DC

## 2017-04-19 ENCOUNTER — Other Ambulatory Visit: Payer: Self-pay | Admitting: *Deleted

## 2017-04-19 DIAGNOSIS — N184 Chronic kidney disease, stage 4 (severe): Principal | ICD-10-CM

## 2017-04-19 DIAGNOSIS — E1122 Type 2 diabetes mellitus with diabetic chronic kidney disease: Secondary | ICD-10-CM

## 2017-04-19 LAB — INSULIN, RANDOM: Insulin: 16 u[IU]/mL (ref 2.0–19.6)

## 2017-04-19 MED ORDER — METFORMIN HCL ER 500 MG PO TB24: ORAL_TABLET | ORAL | 1 refills | Status: DC

## 2017-05-18 ENCOUNTER — Ambulatory Visit (INDEPENDENT_AMBULATORY_CARE_PROVIDER_SITE_OTHER): Payer: Medicare Other | Admitting: *Deleted

## 2017-05-18 DIAGNOSIS — Z8679 Personal history of other diseases of the circulatory system: Secondary | ICD-10-CM | POA: Diagnosis not present

## 2017-05-18 DIAGNOSIS — I4891 Unspecified atrial fibrillation: Secondary | ICD-10-CM | POA: Diagnosis not present

## 2017-05-18 LAB — POCT INR: INR: 3.2

## 2017-05-21 ENCOUNTER — Other Ambulatory Visit: Payer: Self-pay | Admitting: Internal Medicine

## 2017-05-21 ENCOUNTER — Ambulatory Visit (INDEPENDENT_AMBULATORY_CARE_PROVIDER_SITE_OTHER): Payer: Medicare Other

## 2017-05-21 VITALS — Ht 67.0 in | Wt 155.4 lb

## 2017-05-21 DIAGNOSIS — Z79899 Other long term (current) drug therapy: Secondary | ICD-10-CM

## 2017-05-21 LAB — BASIC METABOLIC PANEL
BUN: 33 mg/dL — ABNORMAL HIGH (ref 7–25)
CALCIUM: 8.7 mg/dL (ref 8.6–10.3)
CO2: 21 mmol/L (ref 20–31)
Chloride: 105 mmol/L (ref 98–110)
Creat: 1.98 mg/dL — ABNORMAL HIGH (ref 0.70–1.11)
GLUCOSE: 223 mg/dL — AB (ref 65–99)
POTASSIUM: 5.1 mmol/L (ref 3.5–5.3)
SODIUM: 139 mmol/L (ref 135–146)

## 2017-05-21 NOTE — Progress Notes (Signed)
Pt presents for lab blood work w/o concerns or questions at this time.(BMET).

## 2017-05-22 ENCOUNTER — Other Ambulatory Visit: Payer: Self-pay | Admitting: Internal Medicine

## 2017-05-25 NOTE — Progress Notes (Signed)
LVM for pt to return office call for LAB results.

## 2017-05-26 ENCOUNTER — Other Ambulatory Visit: Payer: Self-pay | Admitting: Internal Medicine

## 2017-05-26 ENCOUNTER — Telehealth: Payer: Self-pay

## 2017-05-26 NOTE — Telephone Encounter (Signed)
I have tried to reach the pt 3 times since this AM and the line has been busy. Will try to reach the patient again. Due to busy line unable to LVM.

## 2017-05-26 NOTE — Progress Notes (Signed)
Pt aware of lab results & voiced understanding of those results.

## 2017-05-26 NOTE — Telephone Encounter (Signed)
-----   Message from Quentin MullingAmanda Collier, [place]-C sent at 05/26/2017  8:33 AM EDT ----- Regarding: FW: med questions Will just monitor for now, do not start metformin, discussed with Dr. Oneta RackMckeown monitor glucoses about 3 x/ week and call if fasting readings over 180 (A1c 8.0%)   Marchelle FolksAmanda ----- Message ----- From: [name], [name], MD Sent: 05/26/2017   8:20 AM To: Quentin MullingAmanda Collier, PA-C Subject: FW: med questions                               - agree its reasonable to monitor , but would recc he monitor glucoses about 3 x/ week and call if fasting readings over 180 (A1c 8.0%)  ----- Message ----- From: Quentin Mullingollier, Amanda, PA-C Sent: 05/26/2017   7:10 AM To: Lucky CowboyWilliam McKeown, MD Subject: FW: med questions                              Do you want this 83 year old man to start metformin for an A1C of 7.1? I'm going to let you handle that one.  Marchelle FolksAmanda  ----- Message ----- From: Gregery Nauff, Andrew Soria D, CMA Sent: 05/25/2017   3:49 PM To: Quentin MullingAmanda Collier, PA-C Subject: med questions                                  Pt reports he his not taking metformin in a long time & did not know he was suppose to take it. So his question is what & how should he take his METFORMIN.  The direction in his chart says he should be taking 2 tablets 2 x / day for Diabetes.  Please advise.

## 2017-05-27 ENCOUNTER — Telehealth: Payer: Self-pay

## 2017-05-27 ENCOUNTER — Other Ambulatory Visit: Payer: Self-pay

## 2017-05-27 LAB — GLOMERULAR FILTRATION RATE MALE
CREATININE: 1.93 mg/dL — AB (ref 0.70–1.11)
GFR MALE: 24.51 — AB (ref 75–125)

## 2017-05-27 MED ORDER — BLOOD GLUCOSE METER KIT: PACK | 0 refills | Status: DC

## 2017-05-27 NOTE — Telephone Encounter (Signed)
-----   Message from [name], PA-C sent at 05/26/2017  8:33 AM EDT ----- Regarding: FW: med questions Will just monitor for now, do not start metformin, discussed with Dr. Mckeown monitor glucoses about 3 x/ week and call if fasting readings over 180 (A1c 8.0%)   Amanda ----- Message ----- From: McKeown, [name], MD Sent: 05/26/2017   8:20 AM To: [name], PA-C Subject: FW: med questions                               - agree its reasonable to monitor , but would recc he monitor glucoses about 3 x/ week and call if fasting readings over 180 (A1c 8.0%)  ----- Message ----- From: Collier, Amanda, PA-C Sent: 05/26/2017   7:10 AM To: [name] McKeown, MD Subject: FW: med questions                              Do you want this 83 year old man to start metformin for an A1C of 7.1? I'm going to let you handle that one.  Amanda  ----- Message ----- From: Maia Handa D, CMA Sent: 05/25/2017   3:49 PM To: [name], PA-C Subject: med questions                                  Pt reports he his not taking metformin in a long time & did not know he was suppose to take it. So his question is what & how should he take his METFORMIN.  The direction in his chart says he should be taking 2 tablets 2 x / day for Diabetes.  Please advise.    

## 2017-05-27 NOTE — Telephone Encounter (Signed)
Pt was informed to keep a record of his blood sugar number/ test blood sugar 3 x times a week & to not start metformin.  Pt voiced understanding & hung up.  A glucose machine & supplies were sent to pharmacy

## 2017-06-08 ENCOUNTER — Other Ambulatory Visit: Payer: Self-pay | Admitting: Internal Medicine

## 2017-06-15 ENCOUNTER — Ambulatory Visit (INDEPENDENT_AMBULATORY_CARE_PROVIDER_SITE_OTHER): Payer: Medicare Other

## 2017-06-15 DIAGNOSIS — Z8679 Personal history of other diseases of the circulatory system: Secondary | ICD-10-CM | POA: Diagnosis not present

## 2017-06-15 DIAGNOSIS — I4891 Unspecified atrial fibrillation: Secondary | ICD-10-CM | POA: Diagnosis not present

## 2017-06-15 LAB — POCT INR: INR: 3.4

## 2017-06-18 LAB — HM DIABETES EYE EXAM

## 2017-06-29 ENCOUNTER — Other Ambulatory Visit: Payer: Self-pay | Admitting: Internal Medicine

## 2017-06-29 ENCOUNTER — Telehealth: Payer: Self-pay | Admitting: Cardiovascular Disease

## 2017-06-29 ENCOUNTER — Other Ambulatory Visit: Payer: Self-pay

## 2017-06-29 MED ORDER — AMIODARONE HCL 200 MG PO TABS: 100.0000 mg | ORAL_TABLET | Freq: Every day | ORAL | 0 refills | Status: DC

## 2017-06-29 NOTE — Telephone Encounter (Signed)
New message     Pt c/o medication issue:  1. Name of Medication:  amiodarone (PACERONE) 200 MG tablet Take 0.5 tablets (100 mg total) by mouth daily   warfarin (COUMADIN) 2.5 MG tablet TAKE AS DIRECTED BY COUMADIN CLINIC.     2. How are you currently taking this medication (dosage and times per day)?  Has not started the amiodarone   3. Are you having a reaction (difficulty breathing--STAT)?  Drug interaction   4. What is your medication issue? Amiodarone can increase the warafarin and increase his INR do you want him to take this medication

## 2017-06-29 NOTE — Telephone Encounter (Signed)
Yes, pt needs amiodarone. Will monitor INRs and adjust warfarin as needed. Pharmacy is aware to fill.

## 2017-07-05 ENCOUNTER — Emergency Department (HOSPITAL_COMMUNITY)
Admission: EM | Admit: 2017-07-05 | Discharge: 2017-07-05 | Disposition: A | Discharge status: Home / Self Care | Payer: Medicare Other | Attending: Emergency Medicine | Admitting: Emergency Medicine

## 2017-07-05 ENCOUNTER — Encounter (HOSPITAL_COMMUNITY): Payer: Self-pay | Admitting: Emergency Medicine

## 2017-07-05 DIAGNOSIS — I129 Hypertensive chronic kidney disease with stage 1 through stage 4 chronic kidney disease, or unspecified chronic kidney disease: Secondary | ICD-10-CM | POA: Insufficient documentation

## 2017-07-05 DIAGNOSIS — N183 Chronic kidney disease, stage 3 (moderate): Secondary | ICD-10-CM | POA: Insufficient documentation

## 2017-07-05 DIAGNOSIS — Z7901 Long term (current) use of anticoagulants: Secondary | ICD-10-CM | POA: Insufficient documentation

## 2017-07-05 DIAGNOSIS — Y999 Unspecified external cause status: Secondary | ICD-10-CM | POA: Insufficient documentation

## 2017-07-05 DIAGNOSIS — S81811A Laceration without foreign body, right lower leg, initial encounter: Secondary | ICD-10-CM | POA: Insufficient documentation

## 2017-07-05 DIAGNOSIS — Y929 Unspecified place or not applicable: Secondary | ICD-10-CM | POA: Diagnosis not present

## 2017-07-05 DIAGNOSIS — W2203XA Walked into furniture, initial encounter: Secondary | ICD-10-CM | POA: Insufficient documentation

## 2017-07-05 DIAGNOSIS — Z79899 Other long term (current) drug therapy: Secondary | ICD-10-CM | POA: Insufficient documentation

## 2017-07-05 DIAGNOSIS — E1122 Type 2 diabetes mellitus with diabetic chronic kidney disease: Secondary | ICD-10-CM | POA: Diagnosis not present

## 2017-07-05 DIAGNOSIS — Z7982 Long term (current) use of aspirin: Secondary | ICD-10-CM | POA: Insufficient documentation

## 2017-07-05 DIAGNOSIS — I2510 Atherosclerotic heart disease of native coronary artery without angina pectoris: Secondary | ICD-10-CM | POA: Diagnosis not present

## 2017-07-05 DIAGNOSIS — E039 Hypothyroidism, unspecified: Secondary | ICD-10-CM | POA: Diagnosis not present

## 2017-07-05 DIAGNOSIS — Y939 Activity, unspecified: Secondary | ICD-10-CM | POA: Diagnosis not present

## 2017-07-05 DIAGNOSIS — Z7984 Long term (current) use of oral hypoglycemic drugs: Secondary | ICD-10-CM | POA: Insufficient documentation

## 2017-07-05 DIAGNOSIS — S8991XA Unspecified injury of right lower leg, initial encounter: Secondary | ICD-10-CM | POA: Diagnosis present

## 2017-07-05 LAB — PROTIME-INR
INR: 2.84
PROTHROMBIN TIME: 29.6 s — AB (ref 11.4–15.2)

## 2017-07-05 NOTE — ED Triage Notes (Signed)
Pt states he hit his R leg on a table corner yesterday and it has been bleeding ever since despite bandaging it at home. Pt takes coumadin. Alert and oriented.

## 2017-07-05 NOTE — ED Provider Notes (Signed)
[place] DEPT Provider Note   CSN: [id number] Arrival date & time: 07/05/17  1157     History   Chief Complaint Chief Complaint  Patient presents with  . Extremity Laceration    HPI Adam [name] is a 83 y.o. male.  HPI Patient is a 83 year old male presents emergency department with a bleeding skin tear to his right lateral lower leg.  He sustained his injury last night when he struck a piece of furniture with his right leg.  He is on Coumadin.  They recently decreased his Coumadin dosing as his INR was too high.  He has not had his Coumadin rechecked since then.  He is on Coumadin for atrial fibrillation.  No other complaints at this time.  No numbness or tingling in his right lower extremity.  Ambulatory since the event    Past Medical History:  Diagnosis Date  . Atrial fibrillation ([place])   . CORONARY ARTERY DISEASE   . HYPERTENSION   . Leg pain    a. ABI (05/2014):  ABIs falsely elevated due to calcification; TBIs ok; dopplers without significant stenosis  . MURMUR   . NEPHROLITHIASIS, HX OF   . SINUS BRADYCARDIA   . VITAMIN B12 DEFICIENCY     Patient Active Problem List   Diagnosis Date Noted  . 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 Long term current use of anticoagulant therapy 12/03/2015  . Hypothyroidism 05/28/2015  . Initial Medicare annual wellness visit 05/28/2015  . CKD (chronic kidney disease) stage 3, GFR 30-59 ml/min 01/29/2015  . GERD (gastroesophageal reflux disease) 01/29/2015  . Medication management 10/23/2014  . Type II diabetes mellitus with renal manifestations ([place]) 10/17/2013  . Hyperlipidemia 10/17/2013  . Vitamin D deficiency 10/17/2013  . Atrial fibrillation ([place]) 12/16/2010  . Vitamin B12 deficiency 01/13/2008  . Essential hypertension 01/13/2008  . Coronary atherosclerosis 01/13/2008  . NEPHROLITHIASIS, HX OF 01/13/2008    Past Surgical History:  Procedure Laterality Date  . ANGIOPLASTY         Home Medications     Prior to Admission medications   Medication Sig Start Date End Date Taking? Authorizing Provider  amiodarone (PACERONE) 200 MG tablet Take 0.5 tablets (100 mg total) by mouth daily. 06/29/17  Yes Unk Pinto, MD  aspirin 81 MG tablet Take 81 mg by mouth daily.     Yes [provider]  atorvastatin (LIPITOR) 40 MG tablet TAKE ONE TABLET BY MOUTH DAILY 06/08/17  Yes Unk Pinto, MD  Cholecalciferol (VITAMIN D-3) 1000 UNITS CAPS Take 4,000 Units by mouth daily.   Yes [provider]  levothyroxine (SYNTHROID, LEVOTHROID) 88 MCG tablet TAKE ONE TABLET BY MOUTH ONCE DAILY BEFORE BREAKFAST 12/08/16  Yes Unk Pinto, MD  metFORMIN (GLUCOPHAGE XR) 500 MG 24 hr tablet Take 2 tablets 2 x / day for Diabetes Patient taking differently: Take 1,000 mg by mouth 2 (two) times daily.  04/19/17  Yes Unk Pinto, MD  quinapril (ACCUPRIL) 20 MG tablet Take 1 tablet (20 mg total) by mouth daily. 12/08/16  Yes Unk Pinto, MD  rosuvastatin (CRESTOR) 10 MG tablet Take 5 mg by mouth daily.   Yes [provider]  warfarin (COUMADIN) 2.5 MG tablet TAKE AS DIRECTED BY COUMADIN  CLINIC. Patient taking differently: Take 1.25-2.5 mg by mouth daily. Take half a tablet on Monday and a full tablet the rest of the week. 02/16/17  Yes Vicie Mutters, PA-C  blood glucose meter kit and supplies Test blood sugars once daily Dx: E11.9 05/27/17  Vicie Mutters, PA-C  doxazosin (CARDURA) 4 MG tablet Take 1 tablet at bedtime for prostate 08/05/16 02/03/17  Unk Pinto, MD  traMADol (ULTRAM) 50 MG tablet Take 1 tablet (50 mg total) by mouth every 6 (six) hours as needed. Patient not taking: Reported on 07/05/2017 01/07/17 01/07/18  Starlyn Skeans, PA-C    Family History Family History  Problem Relation Age of Onset  . Colon polyps Unknown   . Heart disease Unknown     Social History Social History  Substance Use Topics  . Smoking status: Never Smoker  . Smokeless tobacco: Never  Used  . Alcohol use No     Allergies   Patient has no known allergies.   Review of Systems Review of Systems  All other systems reviewed and are negative.    Physical Exam Updated Vital Signs BP (!) 163/72 (BP Location: Right Arm)   Pulse 72   Temp 98.4 F (36.9 C)   Resp 16   Ht '5\' 7"'$  (1.702 m)   Wt 68 kg (150 lb)   SpO2 99%   BMI 23.49 kg/m   Physical Exam  Constitutional: He is oriented to person, place, and time. He appears well-developed and well-nourished.  HENT:  Head: Normocephalic.  Eyes: EOM are normal.  Neck: Normal range of motion.  Pulmonary/Chest: Effort normal.  Abdominal: He exhibits no distension.  Musculoskeletal: Normal range of motion.  Skin tear with some bleeding of the lateral aspect of his mid right lower leg.  Compartments are soft.  Normal pulses  Neurological: He is alert and oriented to person, place, and time.  Psychiatric: He has a normal mood and affect.  Nursing note and vitals reviewed.    ED Treatments / Results  Labs (all labs ordered are listed, but only abnormal results are displayed) Labs Reviewed  PROTIME-INR - Abnormal; Notable for the following:       Result Value   Prothrombin Time 29.6 (*)    All other components within normal limits    EKG  EKG Interpretation None       Radiology No results found.  Procedures .Marland KitchenLaceration Repair Performed by: Jola Schmidt Authorized by: Jola Schmidt     Consent: Verbal consent obtained. Risks and benefits: risks, benefits and alternatives were discussed Patient identity confirmed: provided demographic data Time out performed prior to procedure Prepped and Draped in normal sterile fashion Wound explored Laceration Location: right lower leg Laceration Length: 2.5cm No Foreign Bodies seen or palpated Anesthesia: none Amount of cleaning: standard Skin closure: tissue adhesive Number of sutures or staples: tissue adhesive Technique: tissue adhesive Patient  tolerance: Patient tolerated the procedure well with no immediate complications.   Medications Ordered in ED Medications - No data to display   Initial Impression / Assessment and Plan / ED Course  I have reviewed the triage vital signs and the nursing notes.  Pertinent labs & imaging results that were available during my care of the patient were reviewed by me and considered in my medical decision making (see chart for details).      Infection warnings.  Tissue adhesive applied.  Bleeding controlled.  INR therapeutic   Final Clinical Impressions(s) / ED Diagnoses   Final diagnoses:  Skin tear of right lower leg without complication, initial encounter    New Prescriptions New Prescriptions   No medications on file     Jola Schmidt, MD 07/05/17 1101

## 2017-07-13 ENCOUNTER — Other Ambulatory Visit: Payer: Self-pay | Admitting: Internal Medicine

## 2017-07-13 ENCOUNTER — Ambulatory Visit (INDEPENDENT_AMBULATORY_CARE_PROVIDER_SITE_OTHER): Payer: Medicare Other

## 2017-07-13 DIAGNOSIS — I4891 Unspecified atrial fibrillation: Secondary | ICD-10-CM | POA: Diagnosis not present

## 2017-07-13 DIAGNOSIS — Z8679 Personal history of other diseases of the circulatory system: Secondary | ICD-10-CM

## 2017-07-13 LAB — POCT INR: INR: 2.5

## 2017-07-21 NOTE — Progress Notes (Signed)
FOLLOW UP Assessment:   Essential hypertension - continue medications, DASH diet, exercise and monitor at home. Call if greater than 130/80.  -     CBC with Differential/Platelet -     BASIC METABOLIC PANEL WITH GFR -     Hepatic function panel -     TSH  Atrial fibrillation, unspecified type (HCC) Continue cardio follow up  Needs flu shot -     Flu vaccine HIGH DOSE PF  Type 2 diabetes mellitus with stage 3 chronic kidney disease, without long-term current use of insulin (HCC) Discussed general issues about diabetes pathophysiology and management., Educational material distributed., Suggested low cholesterol diet., Encouraged aerobic exercise., Discussed foot care., Reminded to get yearly retinal exam. -     Lipid panel -     Hemoglobin A1c  Hypothyroidism, unspecified type Hypothyroidism-check TSH level, continue medications the same, reminded to take on an empty stomach 30-58mins before food.  -     TSH  Medication management -     Magnesium  Warfarin-induced coagulopathy (HCC) Continue follow up  Hyperlipidemia -continue medications, check lipids, decrease fatty foods, increase activity.   Cerumen impaction - stop using Qtips, irrigation used in the office without complications, use OTC drops/oil at home to prevent reoccurence  Over 30 minutes of exam, counseling, chart review, and critical decision making was performed  Future Appointments Date Time Provider Department Center  07/22/2017 10:45 AM [name], PA-C [facility] None  08/10/2017 11:15 AM [facility]  10/29/2017 10:00 AM Lucky Cowboy, MD [facility] None     Subjective:  Adam [name] is a very pleasant, mobile  83 y.o. W male who presents for Medicare Annual Wellness Visit and 3 month follow up for HTN, hyperlipidemia, diabetes, Afib, and vitamin D Def.   His blood pressure has been controlled at home, today their BP is BP: 128/66  He does workout,  walks, golfs 2 days a week.  He denies chest pain, shortness of breath, dizziness. He has history of CAD s/p multiple PCI procedures, last one RCA in 1995.  He has Afib, on amiodarone and coumadin and follows with Dr. Jolinda Croak at coumadin clinic.  Lab Results  Component Value Date   INR 2.5 07/13/2017   INR 2.84 07/05/2017   INR 3.4 06/15/2017   PROTIME 18.1 04/09/2009    He is on cholesterol medication and denies myalgias. His cholesterol is not at goal. The cholesterol last visit was:   Lab Results  Component Value Date   CHOL 174 04/16/2017   HDL 54 04/16/2017   LDLCALC 93 04/16/2017   TRIG 134 04/16/2017   CHOLHDL 3.2 04/16/2017   He has been working on diet and exercise for Diabetes with diabetic chronic kidney disease, with other circulatory complications and with diabetic polyneuropathy, he does not check his sugars, he is not on the metformin, controlled with diet, he is on bASA, he is on ACE/ARB, and denies hypoglycemia , polydipsia, polyuria and visual disturbances. Last A1C was:  Lab Results  Component Value Date   HGBA1C 7.2 (H) 04/16/2017   Lab Results  Component Value Date   GFRNONAA 25 (L) 04/16/2017   Patient is on Vitamin D supplement.   Lab Results  Component Value Date   VD25OH 69 04/16/2017     BMI is Body mass index is 24.15 kg/m., he is working on diet and exercise. Wt Readings from Last 3 Encounters:  07/22/17 154 lb 3.2 oz (69.9 kg)  07/05/17 150 lb (68  kg)  05/21/17 155 lb 6.4 oz (70.5 kg)   He is on thyroid medication. His medication was not changed last visit.   Lab Results  Component Value Date   TSH 2.00 04/16/2017  .   Medication Review: Current Outpatient Prescriptions on File Prior to Visit  Medication Sig Dispense Refill  . amiodarone (PACERONE) 200 MG tablet Take 0.5 tablets (100 mg total) by mouth daily. 90 tablet 0  . aspirin 81 MG tablet Take 81 mg by mouth daily.      Marland Kitchen atorvastatin (LIPITOR) 40 MG tablet TAKE ONE TABLET BY  MOUTH DAILY 90 tablet 0  . blood glucose meter kit and supplies Test blood sugars once daily Dx: E11.9 1 each 0  . Cholecalciferol (VITAMIN D-3) 1000 UNITS CAPS Take 4,000 Units by mouth daily.    Marland Kitchen doxazosin (CARDURA) 4 MG tablet Take 1 tablet at bedtime for prostate 90 tablet 1  . levothyroxine (SYNTHROID, LEVOTHROID) 88 MCG tablet TAKE ONE TABLET BY MOUTH BEFORE BREAKFAST DAILY 90 tablet 1  . metFORMIN (GLUCOPHAGE XR) 500 MG 24 hr tablet Take 2 tablets 2 x / day for Diabetes (Patient taking differently: Take 1,000 mg by mouth 2 (two) times daily. ) 360 tablet 1  . quinapril (ACCUPRIL) 20 MG tablet Take 1 tablet (20 mg total) by mouth daily. 90 tablet 1  . rosuvastatin (CRESTOR) 10 MG tablet Take 5 mg by mouth daily.    . traMADol (ULTRAM) 50 MG tablet Take 1 tablet (50 mg total) by mouth every 6 (six) hours as needed. (Patient not taking: Reported on 07/05/2017) 60 tablet 0  . warfarin (COUMADIN) 2.5 MG tablet TAKE AS DIRECTED BY COUMADIN  CLINIC. (Patient taking differently: Take 1.25-2.5 mg by mouth daily. Take half a tablet on Monday and a full tablet the rest of the week.) 100 tablet 1   No current facility-administered medications on file prior to visit.     Allergies: No Known Allergies  Current Problems (verified) has Vitamin B12 deficiency; Essential hypertension; Coronary atherosclerosis; NEPHROLITHIASIS, HX OF; Atrial fibrillation ([place]); Type II diabetes mellitus with renal manifestations ([place]); Hyperlipidemia; Vitamin D deficiency; Medication management; CKD (chronic kidney disease) stage 3, GFR 30-59 ml/min; GERD (gastroesophageal reflux disease); Hypothyroidism; Initial Medicare annual wellness visit; Long term current use of anticoagulant therapy; and Bladder neck obstruction on his problem list.  Surgical History: reviewed and unchanged Family History: reviewed and unchanged Social History: reviewed and unchanged   Objective:   There were no vitals filed for this  visit. There is no height or weight on file to calculate BMI.  General appearance: alert, no distress, WD/WN, male HEENT: normocephalic, sclerae anicteric, TMs pearly, nares patent, no discharge or erythema, pharynx normal Oral cavity: MMM, no lesions Neck: supple, no lymphadenopathy, no thyromegaly, no masses Heart: RRR, normal S1, S2, no murmurs Lungs: CTA bilaterally, no wheezes, rhonchi, or rales Abdomen: +bs, soft, non tender, non distended, no masses, no hepatomegaly, no splenomegaly Musculoskeletal: nontender, no swelling, no obvious deformity Extremities: no edema, no cyanosis, no clubbing Pulses: 2+ symmetric, upper and lower extremities, normal cap refill Neurological: alert, oriented x 3, CN2-12 intact, strength normal upper extremities and lower extremities, sensation normal throughout, DTRs 2+ throughout, no cerebellar signs, gait normal Psychiatric: normal affect, behavior normal, pleasant    Vicie Mutters, PA-C   07/21/2017

## 2017-07-22 ENCOUNTER — Ambulatory Visit (INDEPENDENT_AMBULATORY_CARE_PROVIDER_SITE_OTHER): Payer: Medicare Other | Admitting: Physician Assistant

## 2017-07-22 ENCOUNTER — Encounter: Payer: Self-pay | Admitting: Physician Assistant

## 2017-07-22 VITALS — BP 128/66 | HR 67 | Temp 97.9°F | Resp 14 | Ht 67.0 in | Wt 154.2 lb

## 2017-07-22 DIAGNOSIS — N183 Chronic kidney disease, stage 3 unspecified: Secondary | ICD-10-CM

## 2017-07-22 DIAGNOSIS — H9201 Otalgia, right ear: Secondary | ICD-10-CM

## 2017-07-22 DIAGNOSIS — H6121 Impacted cerumen, right ear: Secondary | ICD-10-CM | POA: Diagnosis not present

## 2017-07-22 DIAGNOSIS — D6832 Hemorrhagic disorder due to extrinsic circulating anticoagulants: Secondary | ICD-10-CM

## 2017-07-22 DIAGNOSIS — I4891 Unspecified atrial fibrillation: Secondary | ICD-10-CM

## 2017-07-22 DIAGNOSIS — I10 Essential (primary) hypertension: Secondary | ICD-10-CM

## 2017-07-22 DIAGNOSIS — Z23 Encounter for immunization: Secondary | ICD-10-CM | POA: Diagnosis not present

## 2017-07-22 DIAGNOSIS — T45515A Adverse effect of anticoagulants, initial encounter: Secondary | ICD-10-CM

## 2017-07-22 DIAGNOSIS — E039 Hypothyroidism, unspecified: Secondary | ICD-10-CM

## 2017-07-22 DIAGNOSIS — E782 Mixed hyperlipidemia: Secondary | ICD-10-CM

## 2017-07-22 DIAGNOSIS — E1122 Type 2 diabetes mellitus with diabetic chronic kidney disease: Secondary | ICD-10-CM

## 2017-07-22 DIAGNOSIS — Z79899 Other long term (current) drug therapy: Secondary | ICD-10-CM

## 2017-07-22 NOTE — Patient Instructions (Signed)
[facility] is cheapest place to get the glucose meter strips Can get there meter for free and buy their strips   Your A1C is a measure of your sugar over the past 3 months and is not affected by what you have eaten over the past few days. Diabetes increases your chances of stroke and heart attack over 300 % and is the leading cause of blindness and kidney failure in the [place]. Please make sure you decrease bad carbs like white bread, white rice, potatoes, corn, soft drinks, pasta, cereals, refined sugars, sweet tea, dried fruits, and fruit juice. Good carbs are okay to eat in moderation like sweet potatoes, brown rice, whole grain pasta/bread, most fruit (except dried fruit) and you can eat as many veggies as you want.   Greater than 6.5 is considered diabetic. Between 6.4 and 5.7 is prediabetic If your A1C is less than 5.7 you are NOT diabetic.  Targets for Glucose Readings: Time of Check Target for patients WITHOUT Diabetes Target for DIABETICS  Before Meals Less than 100  less than 150  Two hours after meals Less than 200  Less than 250

## 2017-07-27 ENCOUNTER — Other Ambulatory Visit: Payer: Self-pay | Admitting: Internal Medicine

## 2017-08-10 ENCOUNTER — Ambulatory Visit (INDEPENDENT_AMBULATORY_CARE_PROVIDER_SITE_OTHER): Payer: Medicare Other | Admitting: *Deleted

## 2017-08-10 DIAGNOSIS — I4891 Unspecified atrial fibrillation: Secondary | ICD-10-CM

## 2017-08-10 DIAGNOSIS — Z5181 Encounter for therapeutic drug level monitoring: Secondary | ICD-10-CM

## 2017-08-10 DIAGNOSIS — Z8679 Personal history of other diseases of the circulatory system: Secondary | ICD-10-CM

## 2017-08-10 LAB — POCT INR: INR: 2.6

## 2017-09-14 ENCOUNTER — Ambulatory Visit (INDEPENDENT_AMBULATORY_CARE_PROVIDER_SITE_OTHER): Payer: Medicare Other | Admitting: *Deleted

## 2017-09-14 DIAGNOSIS — Z8679 Personal history of other diseases of the circulatory system: Secondary | ICD-10-CM

## 2017-09-14 DIAGNOSIS — Z5181 Encounter for therapeutic drug level monitoring: Secondary | ICD-10-CM

## 2017-09-14 DIAGNOSIS — I4891 Unspecified atrial fibrillation: Secondary | ICD-10-CM

## 2017-09-14 LAB — POCT INR: INR: 2.7

## 2017-09-14 NOTE — Patient Instructions (Signed)
Continue on same dosage 1 tablet daily except 1/2 tablet on Mondays. Recheck INR in 4 weeks.

## 2017-09-15 ENCOUNTER — Other Ambulatory Visit: Payer: Self-pay | Admitting: Physician Assistant

## 2017-10-06 ENCOUNTER — Ambulatory Visit: Payer: Medicare Other | Admitting: Adult Health

## 2017-10-06 ENCOUNTER — Encounter: Payer: Self-pay | Admitting: Adult Health

## 2017-10-06 VITALS — BP 130/62 | HR 65 | Temp 97.5°F | Ht 67.0 in | Wt 156.0 lb

## 2017-10-06 DIAGNOSIS — J00 Acute nasopharyngitis [common cold]: Secondary | ICD-10-CM

## 2017-10-06 DIAGNOSIS — J029 Acute pharyngitis, unspecified: Secondary | ICD-10-CM

## 2017-10-06 MED ORDER — AZELASTINE HCL 0.1 % NA SOLN: 2.0000 | Freq: Two times a day (BID) | NASAL | 2 refills | Status: DC

## 2017-10-06 MED ORDER — AZITHROMYCIN 250 MG PO TABS: ORAL_TABLET | ORAL | 1 refills | Status: AC

## 2017-10-06 NOTE — Patient Instructions (Signed)

## 2017-10-06 NOTE — Progress Notes (Signed)
Assessment and Plan:  Adam [name] was seen today for sore throat.  Diagnoses and all orders for this visit:  Pharyngitis, unspecified etiology Recommend voice rest, plenty of hydration, continue with humidifier and lozenges Call if symptoms don't improve within 3 days or so, or with any concerning new symptoms Recommend cutting coumadin dose in 1/2 while on antibiotic - call heart center who manages coumadin to verify their recommendation -     azithromycin (ZITHROMAX) 250 MG tablet; Take 2 tablets (500 mg) on  Day 1,  followed by 1 tablet (250 mg) once daily on Days 2 through 5.  Acute rhinitis -     azelastine (ASTELIN) 0.1 % nasal spray; Place 2 sprays into both nostrils 2 (two) times daily. Use in each nostril as directed  Further disposition pending results of labs. Discussed med's effects and SE's.   Over 15 minutes of exam, counseling, chart review, and critical decision making was performed.   Future Appointments  Date Time Provider [place]  10/12/2017 11:30 AM [facility]  10/29/2017 10:00 AM Unk Pinto, MD [facility] None    ------------------------------------------------------------------------------------------------------------------   HPI BP 130/62   Pulse 65   Temp (!) 97.5 F (36.4 C)   Ht _0  (1.702 m)   Wt 156 lb (70.8 kg)   SpO2 97%   BMI 24.43 kg/m   83 y.o.male presents for very sore throat, pain with swallowing, running nose, hoarseness x 2 days. He also endorses a mild cough, denies congestion, facial pressure, HA/dizziness, fever/chills/night sweats, decreased appetite/N/V/abdominal pain, CP/palpitations/SOB. He denies changes in vision or hearing, rashes or discharge. He reports this is the "worst sore throat I've ever had."  Has been using a vaporizer and lozenges that have not helped much.   He is not a smoker, no notable respiratory disease. On coumadin for A. Fib - managed by heart center.    Past Medical History:  Diagnosis Date  . Atrial fibrillation ([place])   . CORONARY ARTERY DISEASE   . HYPERTENSION   . Leg pain    a. ABI (05/2014):  ABIs falsely elevated due to calcification; TBIs ok; dopplers without significant stenosis  . MURMUR   . NEPHROLITHIASIS, HX OF   . SINUS BRADYCARDIA   . VITAMIN B12 DEFICIENCY      No Known Allergies  Current Outpatient Medications on File Prior to Visit  Medication Sig  . amiodarone (PACERONE) 200 MG tablet Take 0.5 tablets (100 mg total) by mouth daily.  Marland Kitchen aspirin 81 MG tablet Take 81 mg by mouth daily.    Marland Kitchen atorvastatin (LIPITOR) 40 MG tablet TAKE ONE TABLET BY MOUTH DAILY  . blood glucose meter kit and supplies Test blood sugars once daily Dx: E11.9  . Cholecalciferol (VITAMIN D-3) 1000 UNITS CAPS Take 4,000 Units by mouth daily.  Marland Kitchen levothyroxine (SYNTHROID, LEVOTHROID) 88 MCG tablet TAKE ONE TABLET BY MOUTH BEFORE BREAKFAST DAILY  . quinapril (ACCUPRIL) 20 MG tablet TAKE ONE TABLET BY MOUTH DAILY  . rosuvastatin (CRESTOR) 10 MG tablet Take 5 mg by mouth daily.  . traMADol (ULTRAM) 50 MG tablet Take 1 tablet (50 mg total) by mouth every 6 (six) hours as needed.  . warfarin (COUMADIN) 2.5 MG tablet TAKE AS DIRECTED BY COUMADIN CLINIC  . doxazosin (CARDURA) 4 MG tablet Take 1 tablet at bedtime for prostate   No current facility-administered medications on file prior to visit.     ROS: all negative except above.   Physical Exam:  BP 130/62  Pulse 65   Temp (!) 97.5 F (36.4 C)   Ht _0  (1.702 m)   Wt 156 lb (70.8 kg)   SpO2 97%   BMI 24.43 kg/m   General Appearance: Well nourished, in no apparent distress. Eyes: PERRLA, EOMs, conjunctiva no swelling or erythema Sinuses: No Frontal/maxillary tenderness ENT/Mouth: Ext aud canals clear, TMs without erythema, bulging. Posterior pharynx injected without notable swelling or exudate. Tonsils not swollen or erythematous. Hearing normal with hearing aids.  Neck: Supple.   Respiratory: Respiratory effort normal, BS equal bilaterally without rales, rhonchi, wheezing or stridor.  Cardio: RRR with mild 2/6 early systolic murmur. 1+ symmetric peripheral pulses without edema.  Abdomen: Soft, + BS.  Non tender, no guarding, rebound, hernias, masses. Lymphatics: Non tender without lymphadenopathy.  Musculoskeletal:  normal slow gait.  Skin: Warm, dry without rashes, lesions, ecchymosis.  Neuro: Normal muscle tone, no cerebellar symptoms.  Psych: Awake and oriented X 3, normal affect, Insight and Judgment appropriate.     Izora Ribas, NP 3:28 PM [facility]

## 2017-10-13 ENCOUNTER — Ambulatory Visit (INDEPENDENT_AMBULATORY_CARE_PROVIDER_SITE_OTHER): Payer: Medicare Other

## 2017-10-13 ENCOUNTER — Other Ambulatory Visit: Payer: Self-pay | Admitting: *Deleted

## 2017-10-13 DIAGNOSIS — I4891 Unspecified atrial fibrillation: Secondary | ICD-10-CM | POA: Diagnosis not present

## 2017-10-13 DIAGNOSIS — Z8679 Personal history of other diseases of the circulatory system: Secondary | ICD-10-CM

## 2017-10-13 LAB — POCT INR: INR: 2.5

## 2017-10-13 MED ORDER — LEVOTHYROXINE SODIUM 88 MCG PO TABS: ORAL_TABLET | ORAL | 1 refills | Status: DC

## 2017-10-13 MED ORDER — ATORVASTATIN CALCIUM 40 MG PO TABS: 40.0000 mg | ORAL_TABLET | Freq: Every day | ORAL | 1 refills | Status: AC

## 2017-10-13 NOTE — Patient Instructions (Signed)
Continue on same dosage 1 tablet daily except 1/2 tablet on Mondays. Recheck INR in 4 weeks. 

## 2017-10-29 ENCOUNTER — Ambulatory Visit: Payer: Medicare Other | Admitting: Internal Medicine

## 2017-10-29 ENCOUNTER — Encounter: Payer: Self-pay | Admitting: Internal Medicine

## 2017-10-29 VITALS — BP 130/54 | HR 64 | Temp 97.5°F | Resp 16 | Ht 67.5 in | Wt 156.8 lb

## 2017-10-29 DIAGNOSIS — Z125 Encounter for screening for malignant neoplasm of prostate: Secondary | ICD-10-CM

## 2017-10-29 DIAGNOSIS — N320 Bladder-neck obstruction: Secondary | ICD-10-CM

## 2017-10-29 DIAGNOSIS — K219 Gastro-esophageal reflux disease without esophagitis: Secondary | ICD-10-CM

## 2017-10-29 DIAGNOSIS — I10 Essential (primary) hypertension: Secondary | ICD-10-CM

## 2017-10-29 DIAGNOSIS — Z136 Encounter for screening for cardiovascular disorders: Secondary | ICD-10-CM | POA: Diagnosis not present

## 2017-10-29 DIAGNOSIS — Z1212 Encounter for screening for malignant neoplasm of rectum: Secondary | ICD-10-CM

## 2017-10-29 DIAGNOSIS — E039 Hypothyroidism, unspecified: Secondary | ICD-10-CM

## 2017-10-29 DIAGNOSIS — N183 Chronic kidney disease, stage 3 (moderate): Secondary | ICD-10-CM

## 2017-10-29 DIAGNOSIS — Z1211 Encounter for screening for malignant neoplasm of colon: Secondary | ICD-10-CM

## 2017-10-29 DIAGNOSIS — I482 Chronic atrial fibrillation, unspecified: Secondary | ICD-10-CM

## 2017-10-29 DIAGNOSIS — Z0000 Encounter for general adult medical examination without abnormal findings: Secondary | ICD-10-CM | POA: Diagnosis not present

## 2017-10-29 DIAGNOSIS — E782 Mixed hyperlipidemia: Secondary | ICD-10-CM

## 2017-10-29 DIAGNOSIS — E559 Vitamin D deficiency, unspecified: Secondary | ICD-10-CM

## 2017-10-29 DIAGNOSIS — Z79899 Other long term (current) drug therapy: Secondary | ICD-10-CM

## 2017-10-29 DIAGNOSIS — Z0001 Encounter for general adult medical examination with abnormal findings: Secondary | ICD-10-CM

## 2017-10-29 DIAGNOSIS — I2510 Atherosclerotic heart disease of native coronary artery without angina pectoris: Secondary | ICD-10-CM

## 2017-10-29 DIAGNOSIS — E1122 Type 2 diabetes mellitus with diabetic chronic kidney disease: Secondary | ICD-10-CM

## 2017-10-29 NOTE — Patient Instructions (Signed)
Bleeding Precautions When on Anticoagulant Therapy WHAT IS ANTICOAGULANT THERAPY? Anticoagulant therapy is taking medicine to prevent or reduce blood clots. It is also called blood thinner therapy. Blood clots that form in your blood vessels can be dangerous. They can break loose and travel to your heart, lungs, or brain. This increases your risk of a heart attack or stroke. Anticoagulant therapy causes blood to clot more slowly. You may need anticoagulant therapy if you have:  A medical condition that increases the likelihood that blood clots will form.  A heart defect or a problem with heart rhythm. It is also a common treatment after heart surgery, such as valve replacement. WHAT ARE COMMON TYPES OF ANTICOAGULANT THERAPY? Anticoagulant medicine can be injected or taken by mouth.If you need anticoagulant therapy quickly at the hospital, the medicine may be injected under your skin or given through an IV tube. Heparin is a common example of an anticoagulant that you may get at the hospital. Most anticoagulant therapy is in the form of pills that you take at home every day. These may include:  Aspirin. This common blood thinner works by preventing blood cells (platelets) from sticking together to form a clot. Aspirin is not as strong as anticoagulants that slow down the time that it takes for your body to form a clot.  Clopidogrel. This is a newer type of drug that affects platelets. It is stronger than aspirin.  Warfarin. This is the most common anticoagulant. It changes the way your body uses vitamin K, a vitamin that helps your blood to clot. The risk of bleeding is higher with warfarin than with aspirin. You will need frequent blood tests to make sure you are taking the safest amount.  New anticoagulants. Several new drugs have been approved. They are all taken by mouth. Studies show that these drugs work as well as warfarin. They do not require blood testing. They may cause less bleeding  risk than warfarin. WHAT DO I NEED TO REMEMBER WHEN TAKING ANTICOAGULANT THERAPY? Anticoagulant therapy decreases your risk of forming a blood clot, but it increases your risk of bleeding. Work closely with your health care provider to make sure you are taking your medicine safely. These tips can help:  Learn ways to reduce your risk of bleeding.  If you are taking warfarin:  Have blood tests as ordered by your health care provider.  Do not make any sudden changes to your diet. Vitamin K in your diet can make warfarin less effective.  Do not get pregnant. This medicine may cause birth defects.  Take your medicine at the same time every day. If you forget to take your medicine, take it as soon as you remember. If you miss a whole day, do not double your dose of medicine. Take your normal dose and call your health care provider to check in.  Do not stop taking your medicine on your own.  Tell your health care provider before you start taking any new medicine, vitamin, or herbal product. Some of these could interfere with your therapy.  Tell all of your health care providers that you are on anticoagulant therapy.  Do not have surgery, medical procedures, or dental work until you tell your health care provider that you are on anticoagulant therapy. WHAT CAN AFFECT HOW ANTICOAGULANTS WORK? Certain foods, vitamins, medicines, supplements, and herbal medicines change the way that anticoagulant therapy works. They may increase or decrease the effects of your anticoagulant therapy. Either result can be dangerous for you.    Many over-the-counter medicines for pain, colds, or stomach problems interfere with anticoagulant therapy. Take these only as told by your health care provider.  Do not drink alcohol. It can interfere with your medicine and increase your risk of an injury that causes bleeding.  If you are taking warfarin, do not begin eating more foods that contain vitamin K. These include  leafy green vegetables. Ask your health care provider if you should avoid any foods. WHAT ARE SOME WAYS TO PREVENT BLEEDING? You can prevent bleeding by taking certain precautions:  Be extra careful when you use knives, scissors, or other sharp objects.  Use an electric razor instead of a blade.  Do not use toothpicks.  Use a soft toothbrush.  Wear shoes that have nonskid soles.  Use bath mats and handrails in your bathroom.  Wear gloves while you do yard work.  Wear a helmet when you ride a bike.  Wear your seat belt.  Prevent falls by removing loose rugs and extension cords from areas where you walk.  Do not play contact sports or participate in other activities that have a high risk of injury. WHEN SHOULD I CONTACT MY HEALTH CARE PROVIDER? Call your health care provider if:  You miss a dose of medicine:  And you are not sure what to do.  For more than one day.  You have:  Menstrual bleeding that is heavier than normal.  Blood in your urine.  A bloody nose or bleeding gums.  Easy bruising.  Blood in your stool (feces) or have black and tarry stool.  Side effects from your medicine.  You feel weak or dizzy.  You become pregnant. Seek immediate medical care if:  You have bleeding that will not stop.  You have sudden and severe headache or belly pain.  You vomit or you cough up bright red blood.  You have a severe blow to your head. WHAT ARE SOME QUESTIONS TO ASK MY HEALTH CARE PROVIDER?  What is the best anticoagulant therapy for my condition?  What side effects should I watch for?  When should I take my medicine? What should I do if I forget to take it?  Will I need to have regular blood tests?  Do I need to change my diet? Are there foods or drinks that I should avoid?  What activities are safe for me?  What should I do if I want to get pregnant? This information is not intended to replace advice given to you by your health care provider.  Make sure you discuss any questions you have with your health care provider. Document Released: 09/30/2015 Document Reviewed: 09/30/2015 [employer] Interactive Patient Education  2017 [employer] +++++++++++++++++++++++++++++++ Preventive Care for Adults  A healthy lifestyle and preventive care can promote health and wellness. Preventive health guidelines for men include the following key practices:  A routine yearly physical is a good way to check with your health care provider about your health and preventative screening. It is a chance to share any concerns and updates on your health and to receive a thorough exam.  Visit your dentist for a routine exam and preventative care every 6 months. Brush your teeth twice a day and floss once a day. Good oral hygiene prevents tooth decay and gum disease.  The frequency of eye exams is based on your age, health, family medical history, use of contact lenses, and other factors. Follow your health care provider's recommendations for frequency of eye exams.  Eat a healthy diet.   Foods such as vegetables, fruits, whole grains, low-fat dairy products, and lean protein foods contain the nutrients you need without too many calories. Decrease your intake of foods high in solid fats, added sugars, and salt. Eat the right amount of calories for you.Get information about a proper diet from your health care provider, if necessary.  Regular physical exercise is one of the most important things you can do for your health. Most adults should get at least 150 minutes of moderate-intensity exercise (any activity that increases your heart rate and causes you to sweat) each week. In addition, most adults need muscle-strengthening exercises on 2 or more days a week.  Maintain a healthy weight. The body mass index (BMI) is a screening tool to identify possible weight problems. It provides an estimate of body fat based on height and weight. Your health care provider can find  your BMI and can help you achieve or maintain a healthy weight.For adults 20 years and older:  A BMI below 18.5 is considered underweight.  A BMI of 18.5 to 24.9 is normal.  A BMI of 25 to 29.9 is considered overweight.  A BMI of 30 and above is considered obese.  Maintain normal blood lipids and cholesterol levels by exercising and minimizing your intake of saturated fat. Eat a balanced diet with plenty of fruit and vegetables. Blood tests for lipids and cholesterol should begin at age 20 and be repeated every 5 years. If your lipid or cholesterol levels are high, you are over 50, or you are at high risk for heart disease, you may need your cholesterol levels checked more frequently.Ongoing high lipid and cholesterol levels should be treated with medicines if diet and exercise are not working.  If you smoke, find out from your health care provider how to quit. If you do not use tobacco, do not start.  Lung cancer screening is recommended for adults aged 55-80 years who are at high risk for developing lung cancer because of a history of smoking. A yearly low-dose CT scan of the lungs is recommended for people who have at least a 30-pack-year history of smoking and are a current smoker or have quit within the past 15 years. A pack year of smoking is smoking an average of 1 pack of cigarettes a day for 1 year (for example: 1 pack a day for 30 years or 2 packs a day for 15 years). Yearly screening should continue until the smoker has stopped smoking for at least 15 years. Yearly screening should be stopped for people who develop a health problem that would prevent them from having lung cancer treatment.  If you choose to drink alcohol, do not have more than 2 drinks per day. One drink is considered to be 12 ounces (355 mL) of beer, 5 ounces (148 mL) of wine, or 1.5 ounces (44 mL) of liquor.  Avoid use of street drugs. Do not share needles with anyone. Ask for help if you need support or instructions  about stopping the use of drugs.  High blood pressure causes heart disease and increases the risk of stroke. Your blood pressure should be checked at least every 1-2 years. Ongoing high blood pressure should be treated with medicines, if weight loss and exercise are not effective.  If you are 45-79 years old, ask your health care provider if you should take aspirin to prevent heart disease.  Diabetes screening involves taking a blood sample to check your fasting blood sugar level. Testing should be considered   at a younger age or be carried out more frequently if you are overweight and have at least 1 risk factor for diabetes.  Colorectal cancer can be detected and often prevented. Most routine colorectal cancer screening begins at the age of 50 and continues through age 75. However, your health care provider may recommend screening at an earlier age if you have risk factors for colon cancer. On a yearly basis, your health care provider may provide home test kits to check for hidden blood in the stool. Use of a small camera at the end of a tube to directly examine the colon (sigmoidoscopy or colonoscopy) can detect the earliest forms of colorectal cancer. Talk to your health care provider about this at age 50, when routine screening begins. Direct exam of the colon should be repeated every 5-10 years through age 75, unless early forms of precancerous polyps or small growths are found.  Hepatitis C blood testing is recommended for all people born from 1945 through 1965 and any individual with known risks for hepatitis C.  Screening for abdominal aortic aneurysm (AAA)  by ultrasound is recommended for people who have history of high blood pressure or who are current or former smokers.  Healthy men should  receive prostate-specific antigen (PSA) blood tests as part of routine cancer screening. Talk with your health care provider about prostate cancer screening.  Testicular cancer screening is   recommended for adult males. Screening includes self-exam, a health care provider exam, and other screening tests. Consult with your health care provider about any symptoms you have or any concerns you have about testicular cancer.  Use sunscreen. Apply sunscreen liberally and repeatedly throughout the day. You should seek shade when your shadow is shorter than you. Protect yourself by wearing long sleeves, pants, a wide-brimmed hat, and sunglasses year round, whenever you are outdoors.  Once a month, do a whole-body skin exam, using a mirror to look at the skin on your back. Tell your health care provider about new moles, moles that have irregular borders, moles that are larger than a pencil eraser, or moles that have changed in shape or color.  Stay current with required vaccines (immunizations).  Influenza vaccine. All adults should be immunized every year.  Tetanus, diphtheria, and acellular pertussis (Td, Tdap) vaccine. An adult who has not previously received Tdap or who does not know his vaccine status should receive 1 dose of Tdap. This initial dose should be followed by tetanus and diphtheria toxoids (Td) booster doses every 10 years. Adults with an unknown or incomplete history of completing a 3-dose immunization series with Td-containing vaccines should begin or complete a primary immunization series including a Tdap dose. Adults should receive a Td booster every 10 years.  Zoster vaccine. One dose is recommended for adults aged 60 years or older unless certain conditions are present.    PREVNAR - Pneumococcal 13-valent conjugate (PCV13) vaccine. When indicated, a person who is uncertain of his immunization history and has no record of immunization should receive the PCV13 vaccine. An adult aged 19 years or older who has certain medical conditions and has not been previously immunized should receive 1 dose of PCV13 vaccine. This PCV13 should be followed with a dose of pneumococcal  polysaccharide (PPSV23) vaccine. The PPSV23 vaccine dose should be obtained at least 8 weeks after the dose of PCV13 vaccine. An adult aged 19 years or older who has certain medical conditions and previously received 1 or more doses of PPSV23 vaccine should receive   1 dose of PCV13. The PCV13 vaccine dose should be obtained 1 or more years after the last PPSV23 vaccine dose.    PNEUMOVAX - Pneumococcal polysaccharide (PPSV23) vaccine. When PCV13 is also indicated, PCV13 should be obtained first. All adults aged 65 years and older should be immunized. An adult younger than age 65 years who has certain medical conditions should be immunized. Any person who resides in a nursing home or long-term care facility should be immunized. An adult smoker should be immunized. People with an immunocompromised condition and certain other conditions should receive both PCV13 and PPSV23 vaccines. People with human immunodeficiency virus (HIV) infection should be immunized as soon as possible after diagnosis. Immunization during chemotherapy or radiation therapy should be avoided. Routine use of PPSV23 vaccine is not recommended for American Indians, [place] Natives, or people younger than 65 years unless there are medical conditions that require PPSV23 vaccine. When indicated, people who have unknown immunization and have no record of immunization should receive PPSV23 vaccine. One-time revaccination 5 years after the first dose of PPSV23 is recommended for people aged 19-64 years who have chronic kidney failure, nephrotic syndrome, asplenia, or immunocompromised conditions. People who received 1-2 doses of PPSV23 before age 65 years should receive another dose of PPSV23 vaccine at age 65 years or later if at least 5 years have passed since the previous dose. Doses of PPSV23 are not needed for people immunized with PPSV23 at or after age 65 years.    Hepatitis A vaccine. Adults who wish to be protected from this disease, have  certain high-risk conditions, work with hepatitis A-infected animals, work in hepatitis A research labs, or travel to or work in countries with a high rate of hepatitis A should be immunized. Adults who were previously unvaccinated and who anticipate close contact with an international adoptee during the first 60 days after arrival in the [place] from a country with a high rate of hepatitis A should be immunized.    Hepatitis B vaccine. Adults should be immunized if they wish to be protected from this disease, have certain high-risk conditions, may be exposed to blood or other infectious body fluids, are household contacts or sex partners of hepatitis B positive people, are clients or workers in certain care facilities, or travel to or work in countries with a high rate of hepatitis B.   Preventive Service / Frequency   Ages 65 and over  Blood pressure check.  Lipid and cholesterol check.  Lung cancer screening. / Every year if you are aged 55-80 years and have a 30-pack-year history of smoking and currently smoke or have quit within the past 15 years. Yearly screening is stopped once you have quit smoking for at least 15 years or develop a health problem that would prevent you from having lung cancer treatment.  Fecal occult blood test (FOBT) of stool. You may not have to do this test if you get a colonoscopy every 10 years.  Flexible sigmoidoscopy** or colonoscopy.** / Every 5 years for a flexible sigmoidoscopy or every 10 years for a colonoscopy beginning at age 50 and continuing until age 75.  Hepatitis C blood test.** / For all people born from 1945 through 1965 and any individual with known risks for hepatitis C.  Abdominal aortic aneurysm (AAA) screening./ Screening current or former smokers or have Hypertension.  Skin self-exam. / Monthly.  Influenza vaccine. / Every year.  Tetanus, diphtheria, and acellular pertussis (Tdap/Td) vaccine.** / 1 dose of Td every 10    years.   Zoster vaccine.** / 1 dose for adults aged 60 years or older.         Pneumococcal 13-valent conjugate (PCV13) vaccine.    Pneumococcal polysaccharide (PPSV23) vaccine.     Hepatitis A vaccine.** / Consult your health care provider.  Hepatitis B vaccine.** / Consult your health care provider. Screening for abdominal aortic aneurysm (AAA)  by ultrasound is recommended for people who have history of high blood pressure or who are current or former smokers. ++++++++++ Recommend Adult Low Dose Aspirin or  coated  Aspirin 81 mg daily  To reduce risk of Colon Cancer 20 %,  Skin Cancer 26 % ,  Melanoma 46%  and  Pancreatic cancer 60% ++++++++++++++++++++++ Vitamin D goal  is between 70-100.  Please make sure that you are taking your Vitamin D as directed.  It is very important as a natural anti-inflammatory  helping hair, skin, and nails, as well as reducing stroke and heart attack risk.  It helps your bones and helps with mood. It also decreases numerous cancer risks so please take it as directed.  Low Vit D is associated with a 200-300% higher risk for CANCER  and 200-300% higher risk for HEART   ATTACK  &  STROKE.   ...................................... It is also associated with higher death rate at younger ages,  autoimmune diseases like Rheumatoid arthritis, Lupus, Multiple Sclerosis.    Also many other serious conditions, like depression, Alzheimer's Dementia, infertility, muscle aches, fatigue, fibromyalgia - just to name a few. ++++++++++++++++++++++ Recommend the book "The END of DIETING" by Dr Joel Fuhrman  & the book "The END of DIABETES " by Dr Joel Fuhrman At [employer] - get book & Audio CD's    Being diabetic has a  300% increased risk for heart attack, stroke, cancer, and alzheimer- type vascular dementia. It is very important that you work harder with diet by avoiding all foods that are white. Avoid white rice (brown & wild rice is OK), white potatoes  (sweetpotatoes in moderation is OK), White bread or wheat bread or anything made out of white flour like bagels, donuts, rolls, buns, biscuits, cakes, pastries, cookies, pizza crust, and pasta (made from white flour & egg whites) - vegetarian pasta or spinach or wheat pasta is OK. Multigrain breads like Arnold's or [employer], or multigrain sandwich thins or flatbreads.  Diet, exercise and weight loss can reverse and cure diabetes in the early stages.  Diet, exercise and weight loss is very important in the control and prevention of complications of diabetes which affects every system in your body, ie. Brain - dementia/stroke, eyes - glaucoma/blindness, heart - heart attack/heart failure, kidneys - dialysis, stomach - gastric paralysis, intestines - malabsorption, nerves - severe painful neuritis, circulation - gangrene & loss of a leg(s), and finally cancer and Alzheimers.    I recommend avoid fried & greasy foods,  sweets/candy, white rice (brown or wild rice or Quinoa is OK), white potatoes (sweet potatoes are OK) - anything made from white flour - bagels, doughnuts, rolls, buns, biscuits,white and wheat breads, pizza crust and traditional pasta made of white flour & egg white(vegetarian pasta or spinach or wheat pasta is OK).  Multi-grain bread is OK - like multi-grain flat bread or sandwich thins. Avoid alcohol in excess. Exercise is also important.    Eat all the vegetables you want - avoid meat, especially red meat and dairy - especially cheese.  Cheese is the most concentrated form of trans-fats which is   the worst thing to clog up our arteries. Veggie cheese is OK which can be found in the fresh produce section at [employer] or [employer] or [employer]  ++++++++++++++++++++++ DASH Eating Plan  DASH stands for "Dietary Approaches to Stop Hypertension."   The DASH eating plan is a healthy eating plan that has been shown to reduce high blood pressure (hypertension). Additional health benefits  may include reducing the risk of type 2 diabetes mellitus, heart disease, and stroke. The DASH eating plan may also help with weight loss. WHAT DO I NEED TO KNOW ABOUT THE DASH EATING PLAN? For the DASH eating plan, you will follow these general guidelines:  Choose foods with a percent daily value for sodium of less than 5% (as listed on the food label).  Use salt-free seasonings or herbs instead of table salt or sea salt.  Check with your health care provider or pharmacist before using salt substitutes.  Eat lower-sodium products, often labeled as "lower sodium" or "no salt added."  Eat fresh foods.  Eat more vegetables, fruits, and low-fat dairy products.  Choose whole grains. Look for the word "whole" as the first word in the ingredient list.  Choose fish   Limit sweets, desserts, sugars, and sugary drinks.  Choose heart-healthy fats.  Eat veggie cheese   Eat more home-cooked food and less restaurant, buffet, and fast food.  Limit fried foods.  Cook foods using methods other than frying.  Limit canned vegetables. If you do use them, rinse them well to decrease the sodium.  When eating at a restaurant, ask that your food be prepared with less salt, or no salt if possible.                      WHAT FOODS CAN I EAT? Read Dr Joel Fuhrman's books on The End of Dieting & The End of Diabetes  Grains Whole grain or whole wheat bread. Brown rice. Whole grain or whole wheat pasta. Quinoa, bulgur, and whole grain cereals. Low-sodium cereals. Corn or whole wheat flour tortillas. Whole grain cornbread. Whole grain crackers. Low-sodium crackers.  Vegetables Fresh or frozen vegetables (raw, steamed, roasted, or grilled). Low-sodium or reduced-sodium tomato and vegetable juices. Low-sodium or reduced-sodium tomato sauce and paste. Low-sodium or reduced-sodium canned vegetables.   Fruits All fresh, canned (in natural juice), or frozen fruits.  Protein Products  All fish and  seafood.  Dried beans, peas, or lentils. Unsalted nuts and seeds. Unsalted canned beans.  Dairy Low-fat dairy products, such as skim or 1% milk, 2% or reduced-fat cheeses, low-fat ricotta or cottage cheese, or plain low-fat yogurt. Low-sodium or reduced-sodium cheeses.  Fats and Oils Tub margarines without trans fats. Light or reduced-fat mayonnaise and salad dressings (reduced sodium). Avocado. Safflower, olive, or canola oils. Natural peanut or almond butter.  Other Unsalted popcorn and pretzels. The items listed above may not be a complete list of recommended foods or beverages. Contact your dietitian for more options.  ++++++++++++++++++++  WHAT FOODS ARE NOT RECOMMENDED? Grains/ White flour or wheat flour White bread. White pasta. White rice. Refined cornbread. Bagels and croissants. Crackers that contain trans fat.  Vegetables  Creamed or fried vegetables. Vegetables in a . Regular canned vegetables. Regular canned tomato sauce and paste. Regular tomato and vegetable juices.  Fruits Dried fruits. Canned fruit in light or heavy syrup. Fruit juice.  Meat and Other Protein Products Meat in general - RED meat & White meat.  Fatty cuts of meat. Ribs, chicken   wings, all processed meats as bacon, sausage, bologna, salami, fatback, hot dogs, bratwurst and packaged luncheon meats.  Dairy Whole or 2% milk, cream, half-and-half, and cream cheese. Whole-fat or sweetened yogurt. Full-fat cheeses or blue cheese. Non-dairy creamers and whipped toppings. Processed cheese, cheese spreads, or cheese curds.  Condiments Onion and garlic salt, seasoned salt, table salt, and sea salt. Canned and packaged gravies. Worcestershire sauce. Tartar sauce. Barbecue sauce. Teriyaki sauce. Soy sauce, including reduced sodium. Steak sauce. Fish sauce. Oyster sauce. Cocktail sauce. Horseradish. Ketchup and mustard. Meat flavorings and tenderizers. Bouillon cubes. Hot sauce. Tabasco sauce. Marinades. Taco  seasonings. Relishes.  Fats and Oils Butter, stick margarine, lard, shortening and bacon fat. Coconut, palm kernel, or palm oils. Regular salad dressings.  Pickles and olives. Salted popcorn and pretzels.  The items listed above may not be a complete list of foods and beverages to avoid.    

## 2017-10-29 NOTE — Progress Notes (Addendum)
ADULT & ADOLESCENT INTERNAL MEDICINE   Unk Pinto, M.D.     Uvaldo Bristle. Silverio Lay, P.A.-C Liane Com[address], [place]                [place], [place] [address] Telephone [phone] Telefax [phone] Annual  Screening/Preventative Visit  & Comprehensive Evaluation & Examination     This very nice 83 y.o. MWM presents for a Screening/Preventative Visit & comprehensive evaluation and management of multiple medical co-morbidities.  Patient has been followed for HTN, ASCAD/cAfib, T2_NIDDM, Hyperlipidemia, Hypothyroidism and Vitamin D Deficiency.     HTN predates since thew 1970's. Patient's BP has been controlled at home.  Today's BP is at goal at 130/54.  In 1990 and 2000, he had PCA/Stents x 2 and has been on Coumadin since 2000 for cAfib followed at [facility]. Patient denies any cardiac symptoms as chest pain, palpitations, shortness of breath, dizziness or ankle swelling.     Patient's hyperlipidemia is controlled with diet and medications. Patient denies myalgias or other medication SE's. Last lipids were at goal : Lab Results  Component Value Date   CHOL 158 10/29/2017   HDL 53 10/29/2017   LDLCALC 93 04/16/2017   TRIG 94 10/29/2017   CHOLHDL 3.0 10/29/2017      Patient has hx/o PreDM since 2008 (A1c 6.3% ) and then dx'd T2_DM (A1c 6.5%/2016) which he is attempting to manage with diet.  and patient denies reactive hypoglycemic symptoms, visual blurring, diabetic polys or paresthesias. Last A1c was not at goal: Lab Results  Component Value Date   HGBA1C 6.8 (H) 10/29/2017       Patient was started on Thyroid Replacement in 2011.  Finally, patient has history of Vitamin D Deficiency ("25" / 2008)   and last vitamin D was at goal: Lab Results  Component Value Date   VD25OH 69 10/29/2017   Current Outpatient Medications on File Prior to Visit  Medication Sig  . amiodarone (PACERONE) 200 MG  tablet Take 0.5 tablets (100 mg total) by mouth daily.  Marland Kitchen aspirin 81 MG tablet Take 81 mg by mouth daily.    Marland Kitchen atorvastatin (LIPITOR) 40 MG tablet Take 1 tablet (40 mg total) by mouth daily.  Marland Kitchen azelastine (ASTELIN) 0.1 % nasal spray Place 2 sprays into both nostrils 2 (two) times daily. Use in each nostril as directed  . blood glucose meter kit and supplies Test blood sugars once daily Dx: E11.9  . Cholecalciferol (VITAMIN D-3) 1000 UNITS CAPS Take 4,000 Units by mouth daily.  Marland Kitchen levothyroxine (SYNTHROID, LEVOTHROID) 88 MCG tablet TAKE ONE TABLET BY MOUTH BEFORE BREAKFAST DAILY  . quinapril (ACCUPRIL) 20 MG tablet TAKE ONE TABLET BY MOUTH DAILY  . traMADol (ULTRAM) 50 MG tablet Take 1 tablet (50 mg total) by mouth every 6 (six) hours as needed.  . warfarin (COUMADIN) 2.5 MG tablet TAKE AS DIRECTED BY COUMADIN CLINIC  . doxazosin (CARDURA) 4 MG tablet Take 1 tablet at bedtime for prostate   No current facility-administered medications on file prior to visit.    No Known Allergies Past Medical History:  Diagnosis Date  . Atrial fibrillation ([place])   . CORONARY ARTERY DISEASE   . HYPERTENSION   . Leg pain    a. ABI (05/2014):  ABIs falsely elevated due to calcification; TBIs ok; dopplers without significant stenosis  . MURMUR   .  NEPHROLITHIASIS, HX OF   . SINUS BRADYCARDIA   . VITAMIN B12 DEFICIENCY    Health Maintenance  Topic Date Due  . OPHTHALMOLOGY EXAM  10/11/1933  . PNA vac Low Risk Adult (2 of 2 - PCV13) 01/17/2009  . HEMOGLOBIN A1C  10/16/2017  . FOOT EXAM  10/29/2018  . TETANUS/TDAP  12/02/2025  . INFLUENZA VACCINE  Completed   Immunization History  Administered Date(s) Administered  . DT 12/03/2015  . Influenza Split 07/06/2013  . Influenza, High Dose Seasonal PF 08/09/2014, 07/25/2015, 08/20/2016, 07/22/2017  . Pneumococcal Polysaccharide-23 01/18/2008  . Td 11/02/2004  . Zoster 10/07/2009   Last Colon - 2007 Past Surgical History:  Procedure Laterality Date  .  ANGIOPLASTY     Family History  Problem Relation Age of Onset  . Colon polyps Unknown   . Heart disease Unknown    Socioeconomic History  . Marital status: Married    Spouse name: Joaquim Lai  Tobacco Use  . Smoking status: Never Smoker  . Smokeless tobacco: Never Used  Substance and Sexual Activity  . Alcohol use: No  . Drug use: No  . Sexual activity: No    ROS Constitutional: Denies fever, chills, weight loss/gain, headaches, insomnia,  night sweats or change in appetite. Does c/o fatigue. Eyes: Denies redness, blurred vision, diplopia, discharge, itchy or watery eyes.  ENT: Denies discharge, congestion, post nasal drip, epistaxis, sore throat, earache, hearing loss, dental pain, Tinnitus, Vertigo, Sinus pain or snoring.  Cardio: Denies chest pain, palpitations, irregular heartbeat, syncope, dyspnea, diaphoresis, orthopnea, PND, claudication or edema Respiratory: denies cough, dyspnea, DOE, pleurisy, hoarseness, laryngitis or wheezing.  Gastrointestinal: Denies dysphagia, heartburn, reflux, water brash, pain, cramps, nausea, vomiting, bloating, diarrhea, constipation, hematemesis, melena, hematochezia, jaundice or hemorrhoids Genitourinary: Denies dysuria, frequency, urgency, nocturia, hesitancy, discharge, hematuria or flank pain Musculoskeletal: Denies arthralgia, myalgia, stiffness, Jt. Swelling, pain, limp or strain/sprain. Denies Falls. Skin: Denies puritis, rash, hives, warts, acne, eczema or change in skin lesion Neuro: No weakness, tremor, incoordination, spasms, paresthesia or pain Psychiatric: Denies confusion, memory loss or sensory loss. Denies Depression. Endocrine: Denies change in weight, skin, hair change, nocturia, and paresthesia, diabetic polys, visual blurring or hyper / hypo glycemic episodes.  Heme/Lymph: No excessive bleeding, bruising or enlarged lymph nodes.  Physical Exam  BP (!) 130/54   Pulse 64   Temp (!) 97.5 F (36.4 C)   Resp 16   Ht 5' 7.5"  (1.715 m)   Wt 156 lb 12.8 oz (71.1 kg)   BMI 24.20 kg/m   General Appearance: Well nourished and well groomed and in no apparent distress.  Eyes: PERRLA, EOMs, conjunctiva no swelling or erythema, normal fundi and vessels. Sinuses: No frontal/maxillary tenderness ENT/Mouth: EACs patent / TMs  nl. Nares clear without erythema, swelling, mucoid exudates. Oral hygiene is good. No erythema, swelling, or exudate. Tongue normal, non-obstructing. Tonsils not swollen or erythematous. Hearing normal.  Neck: Supple, thyroid normal. No bruits, nodes or JVD. Respiratory: Respiratory effort normal.  BS equal and clear bilateral without rales, rhonci, wheezing or stridor. Cardio: Heart sounds are soft with sl irregular rate and rhythm and no murmurs. Peripheral pulses are normal and equal bilaterally without edema. No aortic or femoral bruits. Chest: symmetric with normal excursions and percussion.  Abdomen: Soft, with Nl bowel sounds. Nontender, no guarding, rebound, hernias, masses, or organomegaly.  Lymphatics: Non tender without lymphadenopathy.  Genitourinary: DRE - deferred for age. Musculoskeletal: Full ROM all peripheral extremities, joint stability, 5/5 strength, and normal gait. Skin: Warm  and dry without rashes, lesions, cyanosis, clubbing or  ecchymosis.  Neuro: Cranial nerves intact, reflexes equal bilaterally. Normal muscle tone, no cerebellar symptoms. Sensation intact.  Pysch: Alert and oriented X 3 with normal affect, insight and judgment appropriate.   Assessment and Plan  1. Annual Preventative/Screening Exam   2. Essential hypertension  - EKG 12-Lead - [place], RETROPERITNL ABD,  LTD - Urinalysis, Routine w reflex microscopic - Microalbumin / creatinine urine ratio - CBC with Differential/Platelet - BASIC METABOLIC PANEL WITH GFR - Magnesium - TSH  3. Hyperlipidemia, mixed  - EKG 12-Lead - [place], RETROPERITNL ABD,  LTD - Hepatic function panel - Lipid panel - TSH  4. Type  2 diabetes mellitus with stage 3 chronic kidney disease, without long-term current use of insulin (HCC)  - EKG 12-Lead - [place], RETROPERITNL ABD,  LTD - Urinalysis, Routine w reflex microscopic - Microalbumin / creatinine urine ratio - HM DIABETES FOOT EXAM - LOW EXTREMITY NEUR EXAM DOCUM - Hemoglobin A1c - Insulin, random  5. Vitamin D deficiency  - VITAMIN D 25 Hydroxy   6. Chronic atrial fibrillation (HCC)  7. Atherosclerosis of native coronary artery of native heart without angina pectoris  - EKG 12-Lead  8. Hypothyroidism  - TSH  9. Gastroesophageal reflux disease   10. Prostate cancer screening  - PSA  11. Bladder neck obstruction  - PSA  12. Screening for colorectal cancer  - POC Hemoccult Bld/Stl   13. Screening for ischemic heart disease  - EKG 12-Lead  14. Screening for AAA (aortic abdominal aneurysm)  - [place], RETROPERITNL ABD,  LTD  15. Medication management  - Urinalysis, Routine w reflex microscopic - Microalbumin / creatinine urine ratio - CBC with Differential/Platelet        Patient was counseled in prudent diet, weight control to achieve/maintain BMI less than 25, BP monitoring, regular exercise and medications as discussed.  Discussed med effects and SE's. Routine screening labs and tests as requested with regular follow-up as recommended. Over 40 minutes of exam, counseling, chart review and high complex critical decision making was performed

## 2017-11-01 LAB — HEMOGLOBIN A1C
EAG (MMOL/L): 8.2 (calc)
Hgb A1c MFr Bld: 6.8 % of total Hgb — ABNORMAL HIGH (ref <5.7)
MEAN PLASMA GLUCOSE: 148 (calc)

## 2017-11-01 LAB — URINALYSIS, ROUTINE W REFLEX MICROSCOPIC
Bilirubin Urine: NEGATIVE
HGB URINE DIPSTICK: NEGATIVE
Ketones, ur: NEGATIVE
Leukocytes, UA: NEGATIVE
NITRITE: NEGATIVE
Protein, ur: NEGATIVE
Specific Gravity, Urine: 1.017 (ref 1.001–1.03)

## 2017-11-01 LAB — HEPATIC FUNCTION PANEL
AG RATIO: 1.6 (calc) (ref 1.0–2.5)
ALBUMIN MSPROF: 3.8 g/dL (ref 3.6–5.1)
ALT: 17 U/L (ref 9–46)
AST: 21 U/L (ref 10–35)
Alkaline phosphatase (APISO): 66 U/L (ref 40–115)
BILIRUBIN DIRECT: 0.1 mg/dL (ref 0.0–0.2)
BILIRUBIN TOTAL: 0.6 mg/dL (ref 0.2–1.2)
Globulin: 2.4 g/dL (calc) (ref 1.9–3.7)
Indirect Bilirubin: 0.5 mg/dL (calc) (ref 0.2–1.2)
Total Protein: 6.2 g/dL (ref 6.1–8.1)

## 2017-11-01 LAB — PSA: PSA: 1.8 ng/mL (ref <=4.0)

## 2017-11-01 LAB — CBC WITH DIFFERENTIAL/PLATELET
BASOS PCT: 1.5 %
Basophils Absolute: 81 cells/uL (ref 0–200)
EOS ABS: 200 {cells}/uL (ref 15–500)
EOS PCT: 3.7 %
HCT: 30.5 % — ABNORMAL LOW (ref 38.5–50.0)
HEMOGLOBIN: 10.3 g/dL — AB (ref 13.2–17.1)
Lymphs Abs: 1696 cells/uL (ref 850–3900)
MCH: 33.3 pg — AB (ref 27.0–33.0)
MCHC: 33.8 g/dL (ref 32.0–36.0)
MCV: 98.7 fL (ref 80.0–100.0)
MONOS PCT: 11.3 %
MPV: 9.9 fL (ref 7.5–12.5)
NEUTROS ABS: 2813 {cells}/uL (ref 1500–7800)
Neutrophils Relative %: 52.1 %
PLATELETS: 195 10*3/uL (ref 140–400)
RBC: 3.09 10*6/uL — ABNORMAL LOW (ref 4.20–5.80)
RDW: 12.5 % (ref 11.0–15.0)
TOTAL LYMPHOCYTE: 31.4 %
WBC mixed population: 610 cells/uL (ref 200–950)
WBC: 5.4 10*3/uL (ref 3.8–10.8)

## 2017-11-01 LAB — MAGNESIUM: Magnesium: 2 mg/dL (ref 1.5–2.5)

## 2017-11-01 LAB — VITAMIN D 25 HYDROXY (VIT D DEFICIENCY, FRACTURES): VIT D 25 HYDROXY: 69 ng/mL (ref 30–100)

## 2017-11-01 LAB — BASIC METABOLIC PANEL WITH GFR
BUN/Creatinine Ratio: 20 (calc) (ref 6–22)
BUN: 43 mg/dL — AB (ref 7–25)
CO2: 23 mmol/L (ref 20–32)
Calcium: 8.9 mg/dL (ref 8.6–10.3)
Chloride: 108 mmol/L (ref 98–110)
Creat: 2.2 mg/dL — ABNORMAL HIGH (ref 0.70–1.11)
GFR, Est African American: 29 mL/min/{1.73_m2} — ABNORMAL LOW (ref >=60)
GFR, Est Non African American: 25 mL/min/{1.73_m2} — ABNORMAL LOW (ref >=60)
GLUCOSE: 212 mg/dL — AB (ref 65–99)
POTASSIUM: 5.1 mmol/L (ref 3.5–5.3)
Sodium: 142 mmol/L (ref 135–146)

## 2017-11-01 LAB — LIPID PANEL
CHOL/HDL RATIO: 3 (calc) (ref <5.0)
Cholesterol: 158 mg/dL (ref <200)
HDL: 53 mg/dL (ref >40)
LDL CHOLESTEROL (CALC): 86 mg/dL
NON-HDL CHOLESTEROL (CALC): 105 mg/dL (ref <130)
TRIGLYCERIDES: 94 mg/dL (ref <150)

## 2017-11-01 LAB — TSH: TSH: 0.49 m[IU]/L (ref 0.40–4.50)

## 2017-11-01 LAB — MICROALBUMIN / CREATININE URINE RATIO
Creatinine, Urine: 106 mg/dL (ref 20–320)
MICROALB UR: 3.8 mg/dL
MICROALB/CREAT RATIO: 36 ug/mg{creat} — AB (ref <30)

## 2017-11-01 LAB — INSULIN, RANDOM: Insulin: 35.6 u[IU]/mL — ABNORMAL HIGH (ref 2.0–19.6)

## 2017-11-10 ENCOUNTER — Ambulatory Visit (INDEPENDENT_AMBULATORY_CARE_PROVIDER_SITE_OTHER): Payer: Medicare Other

## 2017-11-10 DIAGNOSIS — Z8679 Personal history of other diseases of the circulatory system: Secondary | ICD-10-CM

## 2017-11-10 DIAGNOSIS — I4891 Unspecified atrial fibrillation: Secondary | ICD-10-CM | POA: Diagnosis not present

## 2017-11-10 LAB — POCT INR: INR: 2.8

## 2017-11-10 NOTE — Patient Instructions (Addendum)
  Description   Continue on same dosage 1 tablet daily except 1/2 tablet on Mondays. Recheck INR in 4 weeks.

## 2017-11-24 ENCOUNTER — Other Ambulatory Visit: Payer: Self-pay

## 2017-11-24 DIAGNOSIS — Z1212 Encounter for screening for malignant neoplasm of rectum: Secondary | ICD-10-CM | POA: Diagnosis not present

## 2017-11-24 DIAGNOSIS — Z1211 Encounter for screening for malignant neoplasm of colon: Secondary | ICD-10-CM

## 2017-11-24 LAB — POC HEMOCCULT BLD/STL (HOME/3-CARD/SCREEN)
Card #2 Fecal Occult Blod, POC: NEGATIVE
Card #3 Fecal Occult Blood, POC: NEGATIVE
FECAL OCCULT BLD: NEGATIVE

## 2017-12-07 ENCOUNTER — Ambulatory Visit (INDEPENDENT_AMBULATORY_CARE_PROVIDER_SITE_OTHER): Payer: Medicare Other | Admitting: *Deleted

## 2017-12-07 DIAGNOSIS — Z5181 Encounter for therapeutic drug level monitoring: Secondary | ICD-10-CM

## 2017-12-07 DIAGNOSIS — I4891 Unspecified atrial fibrillation: Secondary | ICD-10-CM

## 2017-12-07 LAB — POCT INR: INR: 2.4

## 2017-12-07 NOTE — Patient Instructions (Signed)
Description   Continue on same dosage 1 tablet daily except 1/2 tablet on Mondays. Recheck INR in 4 weeks.

## 2017-12-09 ENCOUNTER — Telehealth: Payer: Self-pay

## 2017-12-09 DIAGNOSIS — I340 Nonrheumatic mitral (valve) insufficiency: Secondary | ICD-10-CM

## 2017-12-09 NOTE — Telephone Encounter (Signed)
-----   Message from Ethelda ChickPamela Pate Ingalls, RN sent at 02/05/2017  4:09 PM EDT ----- Needs echo March 2019

## 2017-12-09 NOTE — Telephone Encounter (Signed)
Left message for patient to call back. Made patient an appointment to have echo before his already scheduled appointment with Dr. Eden EmmsNishan. Will confirm appt for echo when patient calls back.

## 2017-12-15 ENCOUNTER — Ambulatory Visit (HOSPITAL_COMMUNITY)
Admission: RE | Admit: 2017-12-15 | Discharge: 2017-12-15 | Disposition: A | Discharge status: Home / Self Care | Payer: Medicare Other | Source: Ambulatory Visit | Attending: Physician Assistant | Admitting: Physician Assistant

## 2017-12-15 ENCOUNTER — Ambulatory Visit: Payer: Medicare Other | Admitting: Physician Assistant

## 2017-12-15 ENCOUNTER — Encounter: Payer: Self-pay | Admitting: Physician Assistant

## 2017-12-15 VITALS — BP 100/50 | HR 60 | Temp 97.5°F | Resp 14 | Ht 67.5 in | Wt 156.0 lb

## 2017-12-15 DIAGNOSIS — R059 Cough, unspecified: Secondary | ICD-10-CM

## 2017-12-15 DIAGNOSIS — R531 Weakness: Secondary | ICD-10-CM

## 2017-12-15 DIAGNOSIS — T50905A Adverse effect of unspecified drugs, medicaments and biological substances, initial encounter: Secondary | ICD-10-CM

## 2017-12-15 DIAGNOSIS — I959 Hypotension, unspecified: Secondary | ICD-10-CM | POA: Diagnosis not present

## 2017-12-15 DIAGNOSIS — R05 Cough: Secondary | ICD-10-CM

## 2017-12-15 DIAGNOSIS — D689 Coagulation defect, unspecified: Secondary | ICD-10-CM | POA: Diagnosis not present

## 2017-12-15 DIAGNOSIS — R062 Wheezing: Secondary | ICD-10-CM | POA: Diagnosis not present

## 2017-12-15 MED ORDER — DOXYCYCLINE HYCLATE 100 MG PO CAPS: ORAL_CAPSULE | ORAL | 0 refills | Status: DC

## 2017-12-15 NOTE — Patient Instructions (Signed)
Do not take the quinapril tonight Keep an eye on your blood pressure Start on 1/2 of the quinapril if your BP is greater than 120/80  Drink lot of fluids Leave here and [facility]  Will send in antibiotic for you to take  Go to the ER if any chest pain, shortness of breath, nausea, dizziness, severe HA, changes vision/speech  Dizziness Dizziness is a common problem. It makes you feel unsteady or light-headed. You may feel like you are about to pass out (faint). Dizziness can lead to getting hurt if you stumble or fall. Dizziness can be caused by many things, including:  Medicines.  Not having enough water in your body (dehydration).  Illness.  Follow these instructions at home: Eating and drinking  Drink enough fluid to keep your pee (urine) clear or pale yellow. This helps to keep you from getting dehydrated. Try to drink more clear fluids, such as water.  Do not drink alcohol.  Limit how much caffeine you drink or eat, if your doctor tells you to do that.  Limit how much salt (sodium) you drink or eat, if your doctor tells you to do that. Activity  Avoid making quick movements. ? When you stand up from sitting in a chair, steady yourself until you feel okay. ? In the morning, first sit up on the side of the bed. When you feel okay, stand slowly while you hold onto something. Do this until you know that your balance is fine.  If you need to stand in one place for a long time, move your legs often. Tighten and relax the muscles in your legs while you are standing.  Do not drive or use heavy machinery if you feel dizzy.  Avoid bending down if you feel dizzy. Place items in your home so you can reach them easily without leaning over. Lifestyle  Do not use any products that contain nicotine or tobacco, such as cigarettes and e-cigarettes. If you need help quitting, ask your doctor.  Try to lower your stress level. You can do this by using  methods such as yoga or meditation. Talk with your doctor if you need help. General instructions  Watch your dizziness for any changes.  Take over-the-counter and prescription medicines only as told by your doctor. Talk with your doctor if you think that you are dizzy because of a medicine that you are taking.  Tell a friend or a family member that you are feeling dizzy. If he or she notices any changes in your behavior, have this person call your doctor.  Keep all follow-up visits as told by your doctor. This is important. Contact a doctor if:  Your dizziness does not go away.  Your dizziness or light-headedness gets worse.  You feel sick to your stomach (nauseous).  You have trouble hearing.  You have new symptoms.  You are unsteady on your feet.  You feel like the room is spinning. Get help right away if:  You throw up (vomit) or have watery poop (diarrhea), and you cannot eat or drink anything.  You have trouble: ? Talking. ? Walking. ? Swallowing. ? Using your arms, hands, or legs.  You feel generally weak.  You are not thinking clearly, or you have trouble forming sentences. A friend or family member may notice this.  You have: ? Chest pain. ? Pain in your belly (abdomen). ? Shortness of breath. ? Sweating.  Your vision changes.  You are  bleeding.  You have a very bad headache.  You have neck pain or a stiff neck.  You have a fever. These symptoms may be an emergency. Do not wait to see if the symptoms will go away. Get medical help right away. Call your local emergency services (911 in the [place] Do not drive yourself to the hospital. Summary  Dizziness makes you feel unsteady or light-headed. You may feel like you are about to pass out (faint).  Drink enough fluid to keep your pee (urine) clear or pale yellow. Do not drink alcohol.  Avoid making quick movements if you feel dizzy.  Watch your dizziness for any changes. This information is not  intended to replace advice given to you by your health care provider. Make sure you discuss any questions you have with your health care provider. Document Released: 10/08/2011 Document Revised: 11/05/2016 Document Reviewed: 11/05/2016 Elsevier Interactive Patient Education  2017 [employer] Inc.    Hypotension As your heart beats, it forces blood through your body. This force is called blood pressure. If you have hypotension, you have low blood pressure. When your blood pressure is too low, you may not get enough blood to your brain. You may feel weak, feel light-headed, have a fast heartbeat, or even pass out (faint). Follow these instructions at home: Eating and drinking  Drink enough fluids to keep your pee (urine) clear or pale yellow.  Eat a healthy diet, and follow instructions from your doctor about eating or drinking restrictions. A healthy diet includes: ? Fresh fruits and vegetables. ? Whole grains. ? Low-fat (lean) meats. ? Low-fat dairy products.  Eat extra salt only as told. Do not add extra salt to your diet unless your doctor tells you to.  Eat small meals often.  Avoid standing up quickly after you eat. Medicines  Take over-the-counter and prescription medicines only as told by your doctor. ? Follow instructions from your doctor about changing how much you take (the dosage) of your medicines, if this applies. ? Do not stop or change your medicine on your own. General instructions  Wear compression stockings as told by your doctor.  Get up slowly from lying down or sitting.  Avoid hot showers and a lot of heat as told by your doctor.  Return to your normal activities as told by your doctor. Ask what activities are safe for you.  Do not use any products that contain nicotine or tobacco, such as cigarettes and e-cigarettes. If you need help quitting, ask your doctor.  Keep all follow-up visits as told by your doctor. This is important. Contact a doctor if:  You  throw up (vomit).  You have watery poop (diarrhea).  You have a fever for more than 2-3 days.  You feel more thirsty than normal.  You feel weak and tired. Get help right away if:  You have chest pain.  You have a fast or irregular heartbeat.  You lose feeling (get numbness) in any part of your body.  You cannot move your arms or your legs.  You have trouble talking.  You get sweaty or feel light-headed.  You faint.  You have trouble breathing.  You have trouble staying awake.  You feel confused. This information is not intended to replace advice given to you by your health care provider. Make sure you discuss any questions you have with your health care provider. Document Released: 01/13/2010 Document Revised: 07/07/2016 Document Reviewed: 07/07/2016 Elsevier Interactive Patient Education  2017 [employer].

## 2017-12-15 NOTE — Progress Notes (Signed)
Subjective:    Patient ID: Adam [name], male    DOB: Aug 19, 1923, 84 y.o.   MRN: [id number]  HPI 84 y.o. WM with history of HTN, afib on coumadin, CAD, DM 2 with CKD stage 3 presents with hypotension.  He is on quinapril 20 mg at night and amiodarone 179m a day in the AM.   She states Saturday afternoon he ground up several leaves out in the yard, did not wear a mask and has felt bad since that time.  He states for several days he has had decreased energy, dizziness in the AM. He took his BP this AM 4 times due to feeling dizzy, shaky, and not feeling well.  He has had coughing, no wheezing, no SOB, no fever, chills. No Chest pain.  No diarrhea, no dark black stool, blood in stool. Good appetite, good PO intake.   Lab Results  Component Value Date   INR 2.4 12/07/2017   INR 2.8 11/10/2017   INR 2.5 10/13/2017   PROTIME 18.1 04/09/2009    Blood pressure (!) 100/50, pulse 60, temperature (!) 97.5 F (36.4 C), resp. rate 14, height 5' 7.5" (1.715 m), weight 156 lb (70.8 kg), SpO2 96 %.  Medications Current Outpatient Medications on File Prior to Visit  Medication Sig  . amiodarone (PACERONE) 200 MG tablet Take 0.5 tablets (100 mg total) by mouth daily.  .Marland Kitchenaspirin 81 MG tablet Take 81 mg by mouth daily.    .Marland Kitchenatorvastatin (LIPITOR) 40 MG tablet Take 1 tablet (40 mg total) by mouth daily.  .Marland Kitchenazelastine (ASTELIN) 0.1 % nasal spray Place 2 sprays into both nostrils 2 (two) times daily. Use in each nostril as directed  . blood glucose meter kit and supplies Test blood sugars once daily Dx: E11.9  . Cholecalciferol (VITAMIN D-3) 1000 UNITS CAPS Take 4,000 Units by mouth daily.  .Marland Kitchenlevothyroxine (SYNTHROID, LEVOTHROID) 88 MCG tablet TAKE ONE TABLET BY MOUTH BEFORE BREAKFAST DAILY  . quinapril (ACCUPRIL) 20 MG tablet TAKE ONE TABLET BY MOUTH DAILY  . traMADol (ULTRAM) 50 MG tablet Take 1 tablet (50 mg total) by mouth every 6 (six) hours as needed.  . warfarin (COUMADIN) 2.5 MG tablet  TAKE AS DIRECTED BY COUMADIN CLINIC  . doxazosin (CARDURA) 4 MG tablet Take 1 tablet at bedtime for prostate   No current facility-administered medications on file prior to visit.     Problem list He has Vitamin B12 deficiency; Essential hypertension; Coronary atherosclerosis; NEPHROLITHIASIS, HX OF; Atrial fibrillation ([place]; Type II diabetes mellitus with renal manifestations ([place]; Hyperlipidemia; Vitamin D deficiency; Medication management; CKD (chronic kidney disease) stage 3, GFR 30-59 ml/min (HCC); GERD (gastroesophageal reflux disease); Hypothyroidism; Initial Medicare annual wellness visit; Warfarin-induced coagulopathy ([place]; and Bladder neck obstruction on their problem list.   Review of Systems  Constitutional: Positive for activity change and fatigue. Negative for appetite change, chills, diaphoresis, fever and unexpected weight change.  HENT: Negative.   Eyes: Negative.   Respiratory: Positive for cough. Negative for apnea, choking, chest tightness, shortness of breath, wheezing and stridor.   Cardiovascular: Negative.  Negative for chest pain and palpitations.  Gastrointestinal: Negative.  Negative for diarrhea.  Endocrine: Negative.   Genitourinary: Negative.   Musculoskeletal: Negative.   Skin: Negative.  Negative for rash.  Neurological: Positive for dizziness. Negative for tremors, seizures, syncope, facial asymmetry, speech difficulty, weakness, light-headedness, numbness and headaches.  Psychiatric/Behavioral: Negative.        Objective:   Physical Exam  Constitutional: He is  oriented to person, place, and time. He appears well-developed and well-nourished. No distress.  HENT:  Head: Normocephalic and atraumatic.  Right Ear: External ear normal.  Left Ear: External ear normal.  Mouth/Throat: Oropharynx is clear and moist.  Eyes: Conjunctivae and EOM are normal. Pupils are equal, round, and reactive to light.  Neck: Normal range of motion. Neck supple.   Cardiovascular: Normal rate, regular rhythm and normal heart sounds.  Pulmonary/Chest: Effort normal. No respiratory distress. He has no wheezes. He has rales (LLL). He exhibits no tenderness.  Abdominal: Soft. Bowel sounds are normal. There is no tenderness. There is no rebound.  Musculoskeletal: Normal range of motion.  Lymphadenopathy:    He has no cervical adenopathy.  Neurological: He is alert and oriented to person, place, and time. No cranial nerve deficit.  Skin: Skin is warm and dry. He is not diaphoretic.  Psychiatric: He has a normal mood and affect. His behavior is normal.       Assessment & Plan:   Hypotension, unspecified hypotension type Will hold benazapril for now, monitor BP Has cough with weakness and abnormal LLL field, will start on doxy Will adjust coumadin for Doxy pending CXR/INR Normal EKG, no ST changes -     CBC with Differential/Platelet -     BASIC METABOLIC PANEL WITH GFR -     Hepatic function panel -     TSH -     DG Chest 2 View; Future -     EKG 12-Lead -     doxycycline (VIBRAMYCIN) 100 MG capsule; Take 1 capsule twice daily with food  Medication induced coagulopathy (HCC) -     Protime-INR    

## 2017-12-16 ENCOUNTER — Telehealth: Payer: Self-pay | Admitting: Cardiovascular Disease

## 2017-12-16 LAB — HEPATIC FUNCTION PANEL
AG Ratio: 1.1 (calc) (ref 1.0–2.5)
ALKALINE PHOSPHATASE (APISO): 89 U/L (ref 40–115)
ALT: 11 U/L (ref 9–46)
AST: 11 U/L (ref 10–35)
Albumin: 3.5 g/dL — ABNORMAL LOW (ref 3.6–5.1)
BILIRUBIN DIRECT: 0.2 mg/dL (ref 0.0–0.2)
BILIRUBIN INDIRECT: 0.5 mg/dL (ref 0.2–1.2)
GLOBULIN: 3.2 g/dL (ref 1.9–3.7)
TOTAL PROTEIN: 6.7 g/dL (ref 6.1–8.1)
Total Bilirubin: 0.7 mg/dL (ref 0.2–1.2)

## 2017-12-16 LAB — CBC WITH DIFFERENTIAL/PLATELET
BASOS ABS: 53 {cells}/uL (ref 0–200)
BASOS PCT: 0.6 %
EOS ABS: 44 {cells}/uL (ref 15–500)
Eosinophils Relative: 0.5 %
HCT: 31.4 % — ABNORMAL LOW (ref 38.5–50.0)
Hemoglobin: 10.3 g/dL — ABNORMAL LOW (ref 13.2–17.1)
Lymphs Abs: 1074 cells/uL (ref 850–3900)
MCH: 32.4 pg (ref 27.0–33.0)
MCHC: 32.8 g/dL (ref 32.0–36.0)
MCV: 98.7 fL (ref 80.0–100.0)
MONOS PCT: 10.2 %
MPV: 10 fL (ref 7.5–12.5)
Neutro Abs: 6732 cells/uL (ref 1500–7800)
Neutrophils Relative %: 76.5 %
PLATELETS: 279 10*3/uL (ref 140–400)
RBC: 3.18 10*6/uL — ABNORMAL LOW (ref 4.20–5.80)
RDW: 12.1 % (ref 11.0–15.0)
TOTAL LYMPHOCYTE: 12.2 %
WBC: 8.8 10*3/uL (ref 3.8–10.8)
WBCMIX: 898 {cells}/uL (ref 200–950)

## 2017-12-16 LAB — BASIC METABOLIC PANEL WITH GFR
BUN / CREAT RATIO: 21 (calc) (ref 6–22)
BUN: 45 mg/dL — ABNORMAL HIGH (ref 7–25)
CHLORIDE: 106 mmol/L (ref 98–110)
CO2: 23 mmol/L (ref 20–32)
Calcium: 9 mg/dL (ref 8.6–10.3)
Creat: 2.19 mg/dL — ABNORMAL HIGH (ref 0.70–1.11)
GFR, EST AFRICAN AMERICAN: 29 mL/min/{1.73_m2} — AB (ref >=60)
GFR, Est Non African American: 25 mL/min/{1.73_m2} — ABNORMAL LOW (ref >=60)
GLUCOSE: 286 mg/dL — AB (ref 65–99)
Potassium: 5.2 mmol/L (ref 3.5–5.3)
Sodium: 142 mmol/L (ref 135–146)

## 2017-12-16 LAB — PROTIME-INR
INR: 2.1 — ABNORMAL HIGH
Prothrombin Time: 21.7 s — ABNORMAL HIGH (ref 9.0–11.5)

## 2017-12-16 LAB — TEST AUTHORIZATION

## 2017-12-16 LAB — TSH: TSH: 0.25 m[IU]/L — AB (ref 0.40–4.50)

## 2017-12-16 LAB — IRON,TIBC AND FERRITIN PANEL
%SAT: 13 % (calc) — ABNORMAL LOW (ref 15–60)
Ferritin: 283 ng/mL (ref 20–380)
IRON: 27 ug/dL — AB (ref 50–180)
TIBC: 206 ug/dL — AB (ref 250–425)

## 2017-12-16 NOTE — Telephone Encounter (Signed)
Spoke with pt and his son informed that he was started on Doxycycline 100 mg bid for 10 days today Instructed that there is an interaction between Doxycycline and coumadin so instructed to take Doxycycline and coumadin as ordered and then on Sunday Feb 17th  take coumadin 1.25mg  and made an appt for him to be seen in coumadin clinic on Monday Feb 18th and the pt's son states understanding

## 2017-12-16 NOTE — Telephone Encounter (Signed)
New Message   Pt c/o medication issue:  1. Name of Medication: Doxycycline Hyclate  2. How are you currently taking this medication (dosage and times per day)?100mg  1 tablet twice daily with food  3. Are you having a reaction (difficulty breathing--STAT)?none  4. What is your medication issue? Patient states he was prescribed the doxycycline hyclate from his PCP and to take half of his warfarin. He would like to know if this is okay

## 2017-12-20 ENCOUNTER — Ambulatory Visit (INDEPENDENT_AMBULATORY_CARE_PROVIDER_SITE_OTHER): Payer: Medicare Other | Admitting: Pharmacist

## 2017-12-20 DIAGNOSIS — I4891 Unspecified atrial fibrillation: Secondary | ICD-10-CM | POA: Diagnosis not present

## 2017-12-20 DIAGNOSIS — Z5181 Encounter for therapeutic drug level monitoring: Secondary | ICD-10-CM | POA: Diagnosis not present

## 2017-12-20 LAB — POCT INR: INR: 3.1

## 2017-12-20 NOTE — Patient Instructions (Signed)
Description   Take 1/2 tablet tomorrow, then continue on same dosage 1 tablet daily except 1/2 tablet on Mondays. Recheck INR in 4 weeks.

## 2017-12-21 ENCOUNTER — Other Ambulatory Visit: Payer: Self-pay

## 2017-12-21 MED ORDER — WARFARIN SODIUM 2.5 MG PO TABS: ORAL_TABLET | ORAL | 0 refills | Status: DC

## 2017-12-22 ENCOUNTER — Other Ambulatory Visit: Payer: Self-pay | Admitting: *Deleted

## 2017-12-22 NOTE — Telephone Encounter (Signed)
[facility] left a msg on the refill vm requesting clarification on rx that was sent in yesterday by Quentin MullingAmanda Collier, PA's office. They spoke with patient and he stated that he comes here for his inr. Please call [phone]. Thanks, MI

## 2017-12-22 NOTE — Telephone Encounter (Signed)
[facility] [place], verified 100 tablets is a 90 day supply for pt.

## 2017-12-23 ENCOUNTER — Other Ambulatory Visit: Payer: Self-pay | Admitting: Physician Assistant

## 2017-12-23 MED ORDER — LEVOFLOXACIN 750 MG PO TABS: 750.0000 mg | ORAL_TABLET | Freq: Every day | ORAL | 0 refills | Status: DC

## 2017-12-24 ENCOUNTER — Telehealth: Payer: Self-pay

## 2017-12-24 ENCOUNTER — Telehealth: Payer: Self-pay | Admitting: Physician Assistant

## 2017-12-24 NOTE — Telephone Encounter (Signed)
Spoke w/ pt and he is aware to stop DOXY & start LEVAQUIN. Message sent to front office to schedule a follow up visit.

## 2017-12-24 NOTE — Telephone Encounter (Signed)
Amox/clav is ok with coumadin. No dose adjustment necessary. Please advise patient/son to call back if new or different abx needed or steroids prescribed.

## 2017-12-24 NOTE — Telephone Encounter (Signed)
-----   Message from Quentin MullingAmanda Collier, [place] sent at 12/23/2017  5:41 PM EST ----- Regarding: RE: med Can stop doxy, start on levaquin, follow up in the office If any worsening symptoms go to the ER ----- Message ----- From: Adam [name], [name], CMA Sent: 12/23/2017   5:12 PM To: Quentin MullingAmanda Collier, PA-C Subject: med                                            Pt states he has not finished the first ABX. He still has a few days left on it. Please advise. He states he will come back in if he needs to.

## 2017-12-24 NOTE — Telephone Encounter (Signed)
Allen(Son) is calling because Adam [name] has pneumonia and was placed on an antibiotic , he is now being placed on a stronger antibiotic (Amoxicillin Potassium CLAV ) , and wants to know will his coumadin need to be adjusted again . He has to take this for 7 days . Please call

## 2017-12-24 NOTE — Telephone Encounter (Signed)
Per Adam [name], patient is fine taking amoxicillin CLAV with warfarin. Called patient's son and informed him that patient is fine taking the amoxicillin. Patient is not taking anything else, including steroids at this time. Patient's son will call back if he has any other questions or concerns.

## 2017-12-24 NOTE — Telephone Encounter (Signed)
84 y.o. WM with history of afib on coumadin, CAD, DM 2, CKD stage 3, HTN was seen 12/15/2017 for hypotension/slight cough, dizziness- had normal EKG without ST changes, has + pneumonia on Xray, given doxy, has been 8-9 days and states he has more SOB, wheezing, night terrors. Advised to go to the ER for evaluation due to age and failing out patient therapy, may need IV antibiotics.

## 2017-12-27 ENCOUNTER — Ambulatory Visit (HOSPITAL_COMMUNITY)
Admission: RE | Admit: 2017-12-27 | Discharge: 2017-12-27 | Disposition: A | Discharge status: Home / Self Care | Payer: Medicare Other | Source: Ambulatory Visit | Attending: Internal Medicine | Admitting: Internal Medicine

## 2017-12-27 ENCOUNTER — Other Ambulatory Visit: Payer: Self-pay

## 2017-12-27 ENCOUNTER — Ambulatory Visit: Payer: Medicare Other | Admitting: Physician Assistant

## 2017-12-27 ENCOUNTER — Encounter (HOSPITAL_COMMUNITY): Payer: Self-pay | Admitting: Emergency Medicine

## 2017-12-27 ENCOUNTER — Other Ambulatory Visit: Payer: Self-pay | Admitting: Internal Medicine

## 2017-12-27 ENCOUNTER — Inpatient Hospital Stay (HOSPITAL_COMMUNITY)
Admission: EM | Admit: 2017-12-27 | Discharge: 2017-12-29 | DRG: 195 | Disposition: A | Discharge status: Home / Self Care | Payer: Medicare Other | Attending: Internal Medicine | Admitting: Internal Medicine

## 2017-12-27 VITALS — BP 104/58 | HR 72 | Temp 97.2°F | Resp 16 | Ht 67.5 in | Wt 155.6 lb

## 2017-12-27 DIAGNOSIS — J189 Pneumonia, unspecified organism: Secondary | ICD-10-CM | POA: Diagnosis not present

## 2017-12-27 DIAGNOSIS — R4702 Dysphasia: Secondary | ICD-10-CM

## 2017-12-27 DIAGNOSIS — N183 Chronic kidney disease, stage 3 (moderate): Secondary | ICD-10-CM

## 2017-12-27 DIAGNOSIS — I480 Paroxysmal atrial fibrillation: Secondary | ICD-10-CM | POA: Diagnosis not present

## 2017-12-27 DIAGNOSIS — I2510 Atherosclerotic heart disease of native coronary artery without angina pectoris: Secondary | ICD-10-CM | POA: Diagnosis present

## 2017-12-27 DIAGNOSIS — J181 Lobar pneumonia, unspecified organism: Secondary | ICD-10-CM

## 2017-12-27 DIAGNOSIS — Z7901 Long term (current) use of anticoagulants: Secondary | ICD-10-CM

## 2017-12-27 DIAGNOSIS — E669 Obesity, unspecified: Secondary | ICD-10-CM | POA: Diagnosis present

## 2017-12-27 DIAGNOSIS — N184 Chronic kidney disease, stage 4 (severe): Secondary | ICD-10-CM

## 2017-12-27 DIAGNOSIS — I482 Chronic atrial fibrillation: Secondary | ICD-10-CM | POA: Diagnosis present

## 2017-12-27 DIAGNOSIS — E1122 Type 2 diabetes mellitus with diabetic chronic kidney disease: Secondary | ICD-10-CM | POA: Diagnosis present

## 2017-12-27 DIAGNOSIS — Z8701 Personal history of pneumonia (recurrent): Secondary | ICD-10-CM | POA: Diagnosis not present

## 2017-12-27 DIAGNOSIS — I4891 Unspecified atrial fibrillation: Secondary | ICD-10-CM | POA: Diagnosis present

## 2017-12-27 DIAGNOSIS — Z7982 Long term (current) use of aspirin: Secondary | ICD-10-CM | POA: Diagnosis not present

## 2017-12-27 DIAGNOSIS — R0781 Pleurodynia: Secondary | ICD-10-CM | POA: Diagnosis not present

## 2017-12-27 DIAGNOSIS — E785 Hyperlipidemia, unspecified: Secondary | ICD-10-CM | POA: Diagnosis present

## 2017-12-27 DIAGNOSIS — I959 Hypotension, unspecified: Secondary | ICD-10-CM

## 2017-12-27 DIAGNOSIS — R531 Weakness: Secondary | ICD-10-CM

## 2017-12-27 DIAGNOSIS — Z6824 Body mass index (BMI) 24.0-24.9, adult: Secondary | ICD-10-CM | POA: Diagnosis not present

## 2017-12-27 DIAGNOSIS — IMO0002 Reserved for concepts with insufficient information to code with codable children: Secondary | ICD-10-CM | POA: Diagnosis present

## 2017-12-27 DIAGNOSIS — E782 Mixed hyperlipidemia: Secondary | ICD-10-CM | POA: Diagnosis present

## 2017-12-27 DIAGNOSIS — I10 Essential (primary) hypertension: Secondary | ICD-10-CM

## 2017-12-27 DIAGNOSIS — R1310 Dysphagia, unspecified: Secondary | ICD-10-CM | POA: Diagnosis present

## 2017-12-27 DIAGNOSIS — E1165 Type 2 diabetes mellitus with hyperglycemia: Secondary | ICD-10-CM | POA: Diagnosis present

## 2017-12-27 DIAGNOSIS — I129 Hypertensive chronic kidney disease with stage 1 through stage 4 chronic kidney disease, or unspecified chronic kidney disease: Secondary | ICD-10-CM | POA: Diagnosis present

## 2017-12-27 LAB — CBC WITH DIFFERENTIAL/PLATELET
BASOS PCT: 1 %
Basophils Absolute: 0.1 10*3/uL (ref 0.0–0.1)
EOS ABS: 0.2 10*3/uL (ref 0.0–0.7)
EOS PCT: 2 %
HCT: 33.4 % — ABNORMAL LOW (ref 39.0–52.0)
Hemoglobin: 11 g/dL — ABNORMAL LOW (ref 13.0–17.0)
LYMPHS ABS: 2.3 10*3/uL (ref 0.7–4.0)
LYMPHS PCT: 22 %
MCH: 32.4 pg (ref 26.0–34.0)
MCHC: 32.9 g/dL (ref 30.0–36.0)
MCV: 98.2 fL (ref 78.0–100.0)
Monocytes Absolute: 1 10*3/uL (ref 0.1–1.0)
Monocytes Relative: 10 %
NEUTROS ABS: 6.7 10*3/uL (ref 1.7–7.7)
Neutrophils Relative %: 65 %
PLATELETS: 454 10*3/uL — AB (ref 150–400)
RBC: 3.4 MIL/uL — ABNORMAL LOW (ref 4.22–5.81)
RDW: 13.3 % (ref 11.5–15.5)
WBC: 10.2 10*3/uL (ref 4.0–10.5)

## 2017-12-27 LAB — I-STAT CG4 LACTIC ACID, ED
LACTIC ACID, VENOUS: 1.38 mmol/L (ref 0.5–1.9)
LACTIC ACID, VENOUS: 1.21 mmol/L (ref 0.5–1.9)

## 2017-12-27 LAB — BASIC METABOLIC PANEL
Anion gap: 16 — ABNORMAL HIGH (ref 5–15)
BUN: 54 mg/dL — ABNORMAL HIGH (ref 6–20)
CALCIUM: 9.7 mg/dL (ref 8.9–10.3)
CO2: 19 mmol/L — ABNORMAL LOW (ref 22–32)
CREATININE: 2.26 mg/dL — AB (ref 0.61–1.24)
Chloride: 104 mmol/L (ref 101–111)
GFR calc non Af Amer: 24 mL/min — ABNORMAL LOW (ref >60)
GFR, EST AFRICAN AMERICAN: 27 mL/min — AB (ref >60)
Glucose, Bld: 125 mg/dL — ABNORMAL HIGH (ref 65–99)
Potassium: 4.4 mmol/L (ref 3.5–5.1)
SODIUM: 139 mmol/L (ref 135–145)

## 2017-12-27 MED ORDER — ATORVASTATIN CALCIUM 40 MG PO TABS
40.0000 mg | ORAL_TABLET | Freq: Every day | ORAL | Status: DC
Start: 2017-12-28 — End: 2017-12-29
  Administered 2017-12-29: 40 mg via ORAL
  Filled 2017-12-27: qty 1

## 2017-12-27 MED ORDER — VANCOMYCIN HCL IN DEXTROSE 1-5 GM/200ML-% IV SOLN
1000.0000 mg | INTRAVENOUS | Status: DC
  Administered 2017-12-28: 1000 mg via INTRAVENOUS
  Filled 2017-12-27: qty 200

## 2017-12-27 MED ORDER — GUAIFENESIN ER 600 MG PO TB12
600.0000 mg | ORAL_TABLET | Freq: Two times a day (BID) | ORAL | Status: DC
  Administered 2017-12-28 – 2017-12-29 (×3): 600 mg via ORAL
  Filled 2017-12-27 (×3): qty 1

## 2017-12-27 MED ORDER — ONDANSETRON HCL 4 MG PO TABS: 4.0000 mg | ORAL_TABLET | Freq: Four times a day (QID) | ORAL | Status: DC | PRN

## 2017-12-27 MED ORDER — ASPIRIN EC 81 MG PO TBEC
81.0000 mg | DELAYED_RELEASE_TABLET | Freq: Every day | ORAL | Status: DC
  Administered 2017-12-29: 81 mg via ORAL
  Filled 2017-12-27: qty 1

## 2017-12-27 MED ORDER — ONDANSETRON HCL 4 MG/2ML IJ SOLN: 4.0000 mg | Freq: Four times a day (QID) | INTRAMUSCULAR | Status: DC | PRN

## 2017-12-27 MED ORDER — ACETAMINOPHEN 325 MG PO TABS: 650.0000 mg | ORAL_TABLET | Freq: Four times a day (QID) | ORAL | Status: DC | PRN

## 2017-12-27 MED ORDER — PIPERACILLIN-TAZOBACTAM 3.375 G IVPB
3.3750 g | Freq: Three times a day (TID) | INTRAVENOUS | Status: DC
  Administered 2017-12-27 – 2017-12-29 (×5): 3.375 g via INTRAVENOUS
  Filled 2017-12-27 (×7): qty 50

## 2017-12-27 MED ORDER — SODIUM CHLORIDE 0.9 % IV SOLN
Freq: Once | INTRAVENOUS | Status: AC
  Administered 2017-12-27: 23:00:00 via INTRAVENOUS

## 2017-12-27 MED ORDER — SODIUM CHLORIDE 0.9 % IV BOLUS (SEPSIS)
1000.0000 mL | Freq: Once | INTRAVENOUS | Status: AC
  Administered 2017-12-27: 1000 mL via INTRAVENOUS

## 2017-12-27 MED ORDER — INSULIN ASPART 100 UNIT/ML ~~LOC~~ SOLN
0.0000 [IU] | Freq: Three times a day (TID) | SUBCUTANEOUS | Status: DC
  Administered 2017-12-28: 1 [IU] via SUBCUTANEOUS

## 2017-12-27 MED ORDER — SODIUM CHLORIDE 0.9 % IV SOLN
2.0000 g | Freq: Once | INTRAVENOUS | Status: DC
  Filled 2017-12-27: qty 2

## 2017-12-27 MED ORDER — AMIODARONE HCL 100 MG PO TABS
100.0000 mg | ORAL_TABLET | Freq: Every day | ORAL | Status: DC
  Administered 2017-12-29: 100 mg via ORAL
  Filled 2017-12-27 (×2): qty 1

## 2017-12-27 MED ORDER — INSULIN ASPART 100 UNIT/ML ~~LOC~~ SOLN: 0.0000 [IU] | Freq: Every day | SUBCUTANEOUS | Status: DC

## 2017-12-27 MED ORDER — VANCOMYCIN HCL IN DEXTROSE 1-5 GM/200ML-% IV SOLN
1000.0000 mg | Freq: Once | INTRAVENOUS | Status: AC
  Administered 2017-12-27: 1000 mg via INTRAVENOUS
  Filled 2017-12-27: qty 200

## 2017-12-27 MED ORDER — ACETAMINOPHEN 650 MG RE SUPP: 650.0000 mg | Freq: Four times a day (QID) | RECTAL | Status: DC | PRN

## 2017-12-27 MED ORDER — SODIUM CHLORIDE 0.9 % IV SOLN
INTRAVENOUS | Status: AC
  Administered 2017-12-28: 01:00:00 via INTRAVENOUS

## 2017-12-27 MED ORDER — HYDROCODONE-ACETAMINOPHEN 5-325 MG PO TABS: 1.0000 | ORAL_TABLET | ORAL | Status: DC | PRN

## 2017-12-27 NOTE — ED Provider Notes (Signed)
[facility] DEPT Provider Note   CSN: [id number] Arrival date & time: 12/27/17  1359     History   Chief Complaint Chief Complaint  Patient presents with  . Pneumonia    HPI Adam [name] is a 84 y.o. male.  HPI   84 year old male with past medical history as below here with bilateral pneumonia.  The patient was recently seen by his PCP and started on antibiotics for community acquired pneumonia.  Was given doxycycline as well as Augmentin.  He had some mild improvement but persistent symptoms.  Over the last several days, he has had increasing generalized weakness.  He said some occasional shortness of breath.  His cough has persisted.  He also has pain with deep inspiration.  He was seen by his PCP again and was 88% on room air as well as low as 82% at home.  He was subsequently sent here for evaluation.  Patient currently states he feels "fine."  He does admit that he feels generally weak, worse with exertion, and has been coughing with sputum production.  Denies any fevers.  His appetite is been normal.  He does endorse a pleuritic pain on his bilateral chest and back with deep inspiration.  He states his shortness of breath is also worse when he takes a deep breath.  No leg swelling.  Past Medical History:  Diagnosis Date  . Atrial fibrillation ([place])   . CORONARY ARTERY DISEASE   . HYPERTENSION   . Leg pain    a. ABI (05/2014):  ABIs falsely elevated due to calcification; TBIs ok; dopplers without significant stenosis  . MURMUR   . NEPHROLITHIASIS, HX OF   . SINUS BRADYCARDIA   . VITAMIN B12 DEFICIENCY     Patient Active Problem List   Diagnosis Date Noted  . CAP (community acquired pneumonia) 12/27/2017  . Dysphasia 12/27/2017  . 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 Warfarin-induced coagulopathy ([place]) 12/03/2015  . Hypothyroidism 05/28/2015  . Initial Medicare annual wellness visit 05/28/2015  . CKD (chronic kidney disease) stage 3,  GFR 30-59 ml/min ([facility]) 01/29/2015  . GERD (gastroesophageal reflux disease) 01/29/2015  . Medication management 10/23/2014  . Type II diabetes mellitus with renal manifestations ([place]) 10/17/2013  . Hyperlipidemia 10/17/2013  . Vitamin D deficiency 10/17/2013  . Atrial fibrillation ([place]) 12/16/2010  . Vitamin B12 deficiency 01/13/2008  . Essential hypertension 01/13/2008  . Coronary atherosclerosis 01/13/2008  . NEPHROLITHIASIS, HX OF 01/13/2008    Past Surgical History:  Procedure Laterality Date  . ANGIOPLASTY         Home Medications    Prior to Admission medications   Medication Sig Start Date End Date Taking? Authorizing Provider  amiodarone (PACERONE) 200 MG tablet Take 0.5 tablets (100 mg total) by mouth daily. 06/29/17  Yes Unk Pinto, MD  amoxicillin-clavulanate (AUGMENTIN) 875-125 MG tablet Take 1 tablet by mouth 2 (two) times daily.  12/24/17  Yes [provider]  aspirin 81 MG tablet Take 81 mg by mouth daily.     Yes [provider]  atorvastatin (LIPITOR) 40 MG tablet Take 1 tablet (40 mg total) by mouth daily. 10/13/17  Yes Unk Pinto, MD  Cholecalciferol (VITAMIN D-3) 1000 UNITS CAPS Take 4,000 Units by mouth daily.   Yes [provider]  quinapril (ACCUPRIL) 20 MG tablet TAKE ONE TABLET BY MOUTH DAILY 07/27/17  Yes Unk Pinto, MD  warfarin (COUMADIN) 2.5 MG tablet Take as directed by the coumadin clinic. Patient taking differently: Take  1.25-2.5 mg by mouth daily. Take 0.5 tablet (1.25 mg) on Mon & Take 1 tablet (2.5 mg) all remaining days. 12/21/17  Yes Vicie Mutters, PA-C  blood glucose meter kit and supplies Test blood sugars once daily Dx: E11.9 05/27/17   Vicie Mutters, PA-C  doxazosin (CARDURA) 4 MG tablet Take 1 tablet at bedtime for prostate 08/05/16 02/03/17  Unk Pinto, MD  levothyroxine (SYNTHROID, LEVOTHROID) 58 MCG tablet TAKE ONE TABLET BY MOUTH BEFORE BREAKFAST DAILY Patient not taking: Reported on  12/27/2017 10/13/17   Unk Pinto, MD    Family History Family History  Problem Relation Age of Onset  . Colon polyps Unknown   . Heart disease Unknown     Social History Social History   Tobacco Use  . Smoking status: Never Smoker  . Smokeless tobacco: Never Used  Substance Use Topics  . Alcohol use: No  . Drug use: No     Allergies   Patient has no known allergies.   Review of Systems Review of Systems  Constitutional: Positive for fatigue.  Respiratory: Positive for cough and shortness of breath.   Neurological: Positive for weakness.  All other systems reviewed and are negative.    Physical Exam Updated Vital Signs BP (!) 156/55 (BP Location: Right Arm)   Pulse 81   Temp 97.6 F (36.4 C) (Oral)   Resp 19   SpO2 100%   Physical Exam  Constitutional: He is oriented to person, place, and time. He appears well-developed and well-nourished. No distress.  HENT:  Head: Normocephalic and atraumatic.  Mouth/Throat: Oropharynx is clear and moist.  Eyes: Conjunctivae are normal.  Neck: Neck supple.  Cardiovascular: Normal rate, regular rhythm and normal heart sounds. Exam reveals no friction rub.  No murmur heard. Pulmonary/Chest: Effort normal. No respiratory distress. He has rales ([place]).  Abdominal: He exhibits no distension.  Musculoskeletal: He exhibits no edema.  Neurological: He is alert and oriented to person, place, and time. He exhibits normal muscle tone.  Skin: Skin is warm. Capillary refill takes less than 2 seconds.  Psychiatric: He has a normal mood and affect.  Nursing note and vitals reviewed.    ED Treatments / Results  Labs (all labs ordered are listed, but only abnormal results are displayed) Labs Reviewed  CBC WITH DIFFERENTIAL/PLATELET - Abnormal; Notable for the following components:      Result Value   RBC 3.40 (*)    Hemoglobin 11.0 (*)    HCT 33.4 (*)    Platelets 454 (*)    All other components within normal limits    BASIC METABOLIC PANEL - Abnormal; Notable for the following components:   CO2 19 (*)    Glucose, Bld 125 (*)    BUN 54 (*)    Creatinine, Ser 2.26 (*)    GFR calc non Af Amer 24 (*)    GFR calc Af Amer 27 (*)    Anion gap 16 (*)    All other components within normal limits  CULTURE, BLOOD (ROUTINE X 2)  CULTURE, BLOOD (ROUTINE X 2)  PROTIME-INR  I-STAT CG4 LACTIC ACID, ED  I-STAT CG4 LACTIC ACID, ED    EKG  EKG Interpretation  Date/Time:  Monday December 27 2017 21:25:56 EST Ventricular Rate:  82 PR Interval:    QRS Duration: 124 QT Interval:  411 QTC Calculation: 480 R Axis:   -58 Text Interpretation:  Sinus rhythm Prolonged PR interval Left bundle branch block Baseline wander in lead(s) V3 Since last EKG, TWI  now evident in lateral leads No ST elevations Confirmed by Duffy Bruce [phone]) on 12/27/2017 9:49:25 PM       Radiology Dg Chest 2 View  Result Date: 12/27/2017 CLINICAL DATA:  Pneumonia diagnosed 12 days ago, continued cough and fatigue, follow-up, history coronary artery disease, hypertension, atrial fibrillation EXAM: CHEST  2 VIEW COMPARISON:  12/15/2017 FINDINGS: Normal heart size, mediastinal contours, and pulmonary vascularity. Atherosclerotic calcifications aorta. Chronic bronchitic changes. Persistent lingular infiltrate consistent with pneumonia, slightly increased. Minimal atelectasis at RIGHT base. Probable small focus of infiltrate in the RIGHT upper lobe. No pleural effusion or pneumothorax. Bones demineralized. IMPRESSION: Bronchitic changes with persistent lingular and suspect subtle new RIGHT upper lobe infiltrates consistent with pneumonia. Electronically Signed   By: Lavonia Dana M.D.   On: 12/27/2017 11:59    Procedures Procedures (including critical care time)  Medications Ordered in ED Medications  0.9 %  sodium chloride infusion (not administered)  vancomycin (VANCOCIN) IVPB 1000 mg/200 mL premix (1,000 mg Intravenous New Bag/Given 12/27/17  2048)  sodium chloride 0.9 % bolus 1,000 mL (1,000 mLs Intravenous New Bag/Given 12/27/17 2047)     Initial Impression / Assessment and Plan / ED Course  I have reviewed the triage vital signs and the nursing notes.  Pertinent labs & imaging results that were available during my care of the patient were reviewed by me and considered in my medical decision making (see chart for details).     84 year old male with past medical history as above here with failure of outpatient antibiotics with now worsening bilateral pneumonia.  Will give broad-spectrum healthcare associated antibiotics, fluids, and plan to admit. LA normal, no signs of severe sepsis.  Final Clinical Impressions(s) / ED Diagnoses   Final diagnoses:  HCAP (healthcare-associated pneumonia)    ED Discharge Orders    None       Duffy Bruce, MD 12/27/17 2149

## 2017-12-27 NOTE — Progress Notes (Signed)
ANTICOAGULATION CONSULT NOTE - Follow Up Consult  Pharmacy Consult for warfarin Indication: atrial fibrillation  No Known Allergies  Patient Measurements:   Heparin Dosing Weight:   Vital Signs: Temp: 97.6 F (36.4 C) (02/25 1411) Temp Source: Oral (02/25 1411) BP: 160/64 (02/25 2343) Pulse Rate: 75 (02/25 2343)  Labs: Recent Labs    12/27/17 1901  HGB 11.0*  HCT 33.4*  PLT 454*  CREATININE 2.26*    Estimated Creatinine Clearance: 19.9 mL/min (A) (by C-G formula based on SCr of 2.26 mg/dL (H)).   Medications:  Home warfarin: 2.5mg  daily except 1.25mg  on Mondays (last dose 2/25 am)  Assessment: 89 YOM presents with cough and fatigue.  Pharmacy asked to resume warfarin for atrial fibrillation.  Today, 12/27/2017  INR SUPRAtherapeutic at 3.89  CBC: Hgb dec but consistent with recent values, pltc elevated  Continued on home ASA and amiodarone,  Broad spectrum a antibiotics may increase effect of warfarin  Goal of Therapy:  INR 2-3  Plan:   No additional warfarin for supratherapeutic INR  Check INR now. Patient took dose this am  Daily INR starting 2/27  Juliette Alcideustin Zeigler, PharmD, BCPS.   Pager: [phone] 12/28/2017 12:23 AM

## 2017-12-27 NOTE — Progress Notes (Signed)
Subjective:    Patient ID: Adam [name], male    DOB: 09/19/1923, 84 y.o.   MRN: [id number]  HPI 84 y.o. WM with history of HTN, afib on coumadin, CAD, DM 2 with CKD stage 3 presents for follow up for pnuemonia.  He was given doxy for pneumonia/hypotension on 12/15/2017, took 10 day, and had worsening cough/SOB, told to go the ER on 12/24/2017 due to failed outpatient, started taking augmentin Friday, patient refused to go to ER. He is following up here today for continued cough/SOB/Hypotension, he had repeat Xray today showing right sided infiltrate. He admits to trouble swallowing pills and liquids x 1 month which is new.   At home his O2 was 82%, here in the office it was 88% RA with 90% on 2L 02.   Due to 2 ABX use and trouble swallowing will refer to ER for possible IV ABX, check comadin level with recent ABX, and possible swallowing study to rule out aspiration pneumonia.   Lab Results  Component Value Date   INR 3.1 12/20/2017   INR 2.1 (H) 12/15/2017   INR 2.4 12/07/2017   PROTIME 18.1 04/09/2009   Wt Readings from Last 3 Encounters:  12/27/17 155 lb 9.6 oz (70.6 kg)  12/15/17 156 lb (70.8 kg)  10/29/17 156 lb 12.8 oz (71.1 kg)    Blood pressure (!) 104/58, pulse 72, temperature (!) 97.2 F (36.2 C), resp. rate 16, height 5' 7.5" (1.715 m), weight 155 lb 9.6 oz (70.6 kg).  Review of Systems  Constitutional: Positive for activity change and fatigue. Negative for appetite change, chills, diaphoresis, fever and unexpected weight change.  HENT: Negative.   Eyes: Negative.   Respiratory: Positive for cough and shortness of breath. Negative for apnea, choking, chest tightness, wheezing and stridor.   Cardiovascular: Negative.  Negative for chest pain, palpitations and leg swelling.  Gastrointestinal: Negative.  Negative for diarrhea.  Endocrine: Negative.   Genitourinary: Negative.   Musculoskeletal: Negative.   Skin: Negative.  Negative for rash.  Neurological: Positive  for dizziness. Negative for tremors, seizures, syncope, facial asymmetry, speech difficulty, weakness, light-headedness, numbness and headaches.  Psychiatric/Behavioral: Negative.        Objective:   Physical Exam  Constitutional: He is oriented to person, place, and time. He appears well-developed and well-nourished. No distress.  HENT:  Head: Normocephalic and atraumatic.  Right Ear: External ear normal.  Left Ear: External ear normal.  Mouth/Throat: Oropharynx is clear and moist.  Eyes: Conjunctivae and EOM are normal. Pupils are equal, round, and reactive to light.  Neck: Normal range of motion. Neck supple.  Cardiovascular: Normal rate, regular rhythm and normal heart sounds.  Pulmonary/Chest: Effort normal. No respiratory distress. He has no wheezes. He has rales (RLL). He exhibits no tenderness.  Abdominal: Soft. Bowel sounds are normal. There is no tenderness. There is no rebound.  Musculoskeletal: Normal range of motion.  Lymphadenopathy:    He has no cervical adenopathy.  Neurological: He is alert and oriented to person, place, and time. No cranial nerve deficit.  Skin: Skin is warm and dry. He is not diaphoretic.  Psychiatric: He has a normal mood and affect. His behavior is normal.       Assessment & Plan:  New pneumonia right side Has been on doxy and augmentin out patient High risk for complications due to age and medical history Some swallowing difficulties Suggest ER for out patient treatment failure Son will take him ? Need swallow study in hospital  versus outpatient.

## 2017-12-27 NOTE — ED Triage Notes (Signed)
Patient BIB son, reports he was seen at PCP today for follow up chest XR after being placed on abx last week for pneumonia. Son reports today's XR resulted in pneumonia in bilateral lungs. Patient denies pain.

## 2017-12-27 NOTE — H&P (Signed)
Adam [name] SWH:[id number] DOB: Aug 11, 1923 DOA: 12/27/2017     PCP: Unk Pinto, MD   Outpatient Specialists:none Patient coming from:    home Lives With family    Chief Complaint: cough fatigue  HPI: Adam [name] is a 84 y.o. male with medical history significant of HTN, afib on coumadin, CAD, DM 2 with CKD stage 3     Presented with worsening fatigue for the past few days.  On 13 February patient was seen in the office was diagnosed with pneumonia right lower lobe pneumonia started with doxycycline 120-second for February he was  started on Augmentin patient continuously feeling more weak repeat chest x-ray showing no bilateral infiltrate at this point he was agreeable to come to emergency department.  In PCP office oxygenation was 82% on room air requiring 2 L Patient also been endorsing trouble swallowing PCP was concerned for aspiration pneumonia. Denies any leg swelling Reports trouble swallowing pills but otherwise doing well with food  While in ER: Started on broad spectrum antibiotics Significant initial  Findings:    Lactic acid 1.21 WBC 10.2 hemoglobin 11 Sodium 139K4.4 bicarb 19 BUN 54 creatinine 2.26   IN ER:  Temp (24hrs), Avg:97.4 F (36.3 C), Min:97.2 F (36.2 C), Max:97.6 F (36.4 C)      on arrival  ED Triage Vitals [12/27/17 1411]  Enc Vitals Group     BP (!) 115/49     Pulse Rate 75     Resp 15     Temp 97.6 F (36.4 C)     Temp Source Oral     SpO2 98 %     Weight      Height      Head Circumference      Peak Flow      Pain Score      Pain Loc      Pain Edu?      Excl. in [place]?     Latest RR 19 sats 90% HR 81 BP 156/55   Following Medications were ordered in ER: Medications  vancomycin (VANCOCIN) IVPB 1000 mg/200 mL premix (1,000 mg Intravenous New Bag/Given 12/27/17 2048)  ceFEPIme (MAXIPIME) 2 g in sodium chloride 0.9 % 100 mL IVPB (not administered)  sodium chloride 0.9 % bolus 1,000 mL (1,000 mLs Intravenous New  Bag/Given 12/27/17 2047)  0.9 %  sodium chloride infusion (not administered)    Hospitalist was called for admission for failure of outpatient treatment for community-acquired pneumonia  Regarding pertinent Chronic problems: History of atrial fibrillation on chronic Coumadin  Review of Systems:    Pertinent positives include: chills, fatigue shortness of breath at rest.   dyspnea on exertion,productive cough  Constitutional:  No weight loss, night sweats, Fevers,   weight loss  HEENT:  No headaches, Difficulty swallowing,Tooth/dental problems,Sore throat,  No sneezing, itching, ear ache, nasal congestion, post nasal drip,  Cardio-vascular:  No chest pain, Orthopnea, PND, anasarca, dizziness, palpitations.no Bilateral lower extremity swelling  GI:  No heartburn, indigestion, abdominal pain, nausea, vomiting, diarrhea, change in bowel habits, loss of appetite, melena, blood in stool, hematemesis Resp:     No excess mucus,   No coughing up of blood.No change in color of mucus. No wheezing. Skin:  no rash or lesions. No jaundice GU:  no dysuria, change in color of urine, no urgency or frequency. No straining to urinate.  No flank pain.  Musculoskeletal:  No joint pain or no joint swelling. No decreased range of motion. No  back pain.  Psych:  No change in mood or affect. No depression or anxiety. No memory loss.  Neuro: no localizing neurological complaints, no tingling, no weakness, no double vision, no gait abnormality, no slurred speech, no confusion  As per HPI otherwise 10 point review of systems negative.   Past Medical History: Past Medical History:  Diagnosis Date  . Atrial fibrillation ([place])   . CORONARY ARTERY DISEASE   . HYPERTENSION   . Leg pain    a. ABI (05/2014):  ABIs falsely elevated due to calcification; TBIs ok; dopplers without significant stenosis  . MURMUR   . NEPHROLITHIASIS, HX OF   . SINUS BRADYCARDIA   . VITAMIN B12 DEFICIENCY    Past Surgical  History:  Procedure Laterality Date  . ANGIOPLASTY       Social History:  Ambulatory  independently     reports that  has never smoked. he has never used smokeless tobacco. He reports that he does not drink alcohol or use drugs.  Allergies:  No Known Allergies     Family History:   Family History  Problem Relation Age of Onset  . Colon polyps Unknown   . Heart disease Unknown     Medications: Prior to Admission medications   Medication Sig Start Date End Date Taking? Authorizing Provider  amiodarone (PACERONE) 200 MG tablet Take 0.5 tablets (100 mg total) by mouth daily. 06/29/17  Yes Unk Pinto, MD  amoxicillin-clavulanate (AUGMENTIN) 875-125 MG tablet Take 1 tablet by mouth 2 (two) times daily.  12/24/17  Yes [provider]  aspirin 81 MG tablet Take 81 mg by mouth daily.     Yes [provider]  atorvastatin (LIPITOR) 40 MG tablet Take 1 tablet (40 mg total) by mouth daily. 10/13/17  Yes Unk Pinto, MD  Cholecalciferol (VITAMIN D-3) 1000 UNITS CAPS Take 4,000 Units by mouth daily.   Yes [provider]  quinapril (ACCUPRIL) 20 MG tablet TAKE ONE TABLET BY MOUTH DAILY 07/27/17  Yes Unk Pinto, MD  warfarin (COUMADIN) 2.5 MG tablet Take as directed by the coumadin clinic. Patient taking differently: Take 1.25-2.5 mg by mouth daily. Take 0.5 tablet (1.25 mg) on Mon & Take 1 tablet (2.5 mg) all remaining days. 12/21/17  Yes Vicie Mutters, PA-C  blood glucose meter kit and supplies Test blood sugars once daily Dx: E11.9 05/27/17   Vicie Mutters, PA-C  doxazosin (CARDURA) 4 MG tablet Take 1 tablet at bedtime for prostate 08/05/16 02/03/17  Unk Pinto, MD  levothyroxine (SYNTHROID, LEVOTHROID) 88 MCG tablet TAKE ONE TABLET BY MOUTH BEFORE BREAKFAST DAILY Patient not taking: Reported on 12/27/2017 10/13/17   Unk Pinto, MD    Physical Exam: Patient Vitals for the past 24 hrs:  BP Temp Temp src Pulse Resp SpO2  12/27/17 2100  (!) 156/55 - - 81 (!) 21 98 %  12/27/17 2007 (!) 152/59 - - 73 16 97 %  12/27/17 1918 (!) 153/59 - - 75 15 94 %  12/27/17 1411 (!) 115/49 97.6 F (36.4 C) Oral 75 15 98 %    1. General:  in No Acute distress  well  -appearing 2. Psychological: Alert and  Oriented 3. Head/ENT:    Dry Mucous Membranes                          Head Non traumatic, neck supple  Poor Dentition 4. SKIN:   decreased Skin turgor,  Skin clean Dry and intact no rash 5. Heart: Regular rate and rhythm no bMurmur, no Rub or gallop 6. Lungs: no wheezes some crackles   7. Abdomen: Soft,  non-tender,   distended   obese   8. Lower extremities: no clubbing, cyanosis, or edema 9. Neurologically Grossly intact, moving all 4 extremities equally  10. MSK: Normal range of motion   body mass index is unknown because there is no height or weight on file.  Labs on Admission:   Labs on Admission: I have personally reviewed following labs and imaging studies  CBC: Recent Labs  Lab 12/27/17 1901  WBC 10.2  NEUTROABS 6.7  HGB 11.0*  HCT 33.4*  MCV 98.2  PLT 633*   Basic Metabolic Panel: Recent Labs  Lab 12/27/17 1901  NA 139  K 4.4  CL 104  CO2 19*  GLUCOSE 125*  BUN 54*  CREATININE 2.26*  CALCIUM 9.7   GFR: Estimated Creatinine Clearance: 19.9 mL/min (A) (by C-G formula based on SCr of 2.26 mg/dL (H)). Liver Function Tests: No results for input(s): AST, ALT, ALKPHOS, BILITOT, PROT, ALBUMIN in the last 168 hours. No results for input(s): LIPASE, AMYLASE in the last 168 hours. No results for input(s): AMMONIA in the last 168 hours. Coagulation Profile: No results for input(s): INR, PROTIME in the last 168 hours. Cardiac Enzymes: No results for input(s): CKTOTAL, CKMB, CKMBINDEX, TROPONINI in the last 168 hours. BNP (last 3 results) No results for input(s): PROBNP in the last 8760 hours. HbA1C: No results for input(s): HGBA1C in the last 72 hours. CBG: No results for  input(s): GLUCAP in the last 168 hours. Lipid Profile: No results for input(s): CHOL, HDL, LDLCALC, TRIG, CHOLHDL, LDLDIRECT in the last 72 hours. Thyroid Function Tests: No results for input(s): TSH, T4TOTAL, FREET4, T3FREE, THYROIDAB in the last 72 hours. Anemia Panel: No results for input(s): VITAMINB12, FOLATE, FERRITIN, TIBC, IRON, RETICCTPCT in the last 72 hours. Urine analysis: Sepsis Labs: '@LABRCNTIP'$ (procalcitonin:4,lacticidven:4) )No results found for this or any previous visit (from the past 240 hour(s)).   UA  not ordered  Lab Results  Component Value Date   HGBA1C 6.8 (H) 10/29/2017    Estimated Creatinine Clearance: 19.9 mL/min (A) (by C-G formula based on SCr of 2.26 mg/dL (H)).  BNP (last 3 results) No results for input(s): PROBNP in the last 8760 hours.   ECG REPORT  Independently reviewed Rate:82  Rhythm: NSR ST&T Change: No acute ischemic changes   QTC 480  There were no vitals filed for this visit.   Cultures: No results found for: SDES, SPECREQUEST, CULT, REPTSTATUS   Radiological Exams on Admission: Dg Chest 2 View  Result Date: 12/27/2017 CLINICAL DATA:  Pneumonia diagnosed 12 days ago, continued cough and fatigue, follow-up, history coronary artery disease, hypertension, atrial fibrillation EXAM: CHEST  2 VIEW COMPARISON:  12/15/2017 FINDINGS: Normal heart size, mediastinal contours, and pulmonary vascularity. Atherosclerotic calcifications aorta. Chronic bronchitic changes. Persistent lingular infiltrate consistent with pneumonia, slightly increased. Minimal atelectasis at RIGHT base. Probable small focus of infiltrate in the RIGHT upper lobe. No pleural effusion or pneumothorax. Bones demineralized. IMPRESSION: Bronchitic changes with persistent lingular and suspect subtle new RIGHT upper lobe infiltrates consistent with pneumonia. Electronically Signed   By: Lavonia Dana M.D.   On: 12/27/2017 11:59    Chart has been  reviewed    Assessment/Plan  84 y.o. male with medical history significant of HTN, afib on coumadin, CAD,  DM 2 with CKD stage 3   Admitted for failure of outpatient treatment for community-acquired pneumonia   Present on Admission: . CAP (community acquired pneumonia) -versus aspiration pneumonia.  Primary care concern for possibility of aspiration given new dysphasia.  Will order speech pathology evaluation patient states the dysphagia is mainly to the medications only.  For now broadly cover since patient has failed outpatient treatment. . Atrial fibrillation ([place]) -           - CHA2DS2 vas score 5 : continue current anticoagulation with Coumadin per pharmacy,        - Rhythm control:  Continue amiodarone  . CKD (chronic kidney disease) stage 3, GFR 30-59 ml/min ([facility]) -currently appears to be stable will avoid nephrotoxic medications . Essential hypertension -continue Norvasc and Cardura currently appears to be stable hold ACE given history of somewhat elevated potassium in the past . Hyperlipidemia -stable continue home medications . Type II diabetes mellitus with renal manifestations (HCC) -  - Order Sensitive * moderate* SSI    -  check TSH and HgA1C Diet-controlled baseline  Hypothyroidism continue Synthroid check TSH   . Dysphasia -states mainly pill dysphagia but given primary care concern for aspiration we will have speech pathology evaluate  Coronary artery disease continue aspirin and statin patient not on beta-blocker  Other plan as per orders.  DVT prophylaxis:  couamdin  Code Status:  FULL CODE  as per patient    Family Communication:   Family not  at  Bedside    Disposition Plan:       To home once workup is complete and patient is stable                              Consults called: none  Admission status: inpatient    Level of care medical floor             I have spent a total of 56 min on this admission   Chery Giusto 12/27/2017, 11:38 PM     [facility]  Pager [phone]   after 2 AM please page floor coverage PA If 7AM-7PM, please contact the day team taking care of the patient  [email or web address]  Password [email or web address]

## 2017-12-27 NOTE — Progress Notes (Signed)
A consult was received from an ED physician for Vancomycin and Cefepime per pharmacy dosing.  The patient's profile has been reviewed for ht/wt/allergies/indication/available labs. A one time order has been placed for the above antibiotics.  Further antibiotics/pharmacy consults should be ordered by admitting physician if indicated.                       Bernadene Personrew Navi Ewton, PharmD, BCPS [phone] 12/27/2017, 7:44 PM

## 2017-12-27 NOTE — Progress Notes (Signed)
Pharmacy Antibiotic Note  Adam [name] is a 84 y.o. male admitted on 12/27/2017 with pneumonia. Failed outpatient treatment with doxycycline and Augmentin for CAP diagnosed 2/13. Also concerns for aspiration. Pharmacy has been consulted for Vancomycin and Zosyn dosing.  Plan:  Vancomycin 1000 mg IV now, then 1000 mg IV q48 hr (est AUC 516 based on SCr 2.26)  Measure vancomycin AUC at steady state as indicated  Zosyn 3.375 g IV given once over 30 minutes, then every 8 hrs by 4-hr infusion - patient is borderline for reduced dose; will f/u SCr closely  Daily SCr while on Vanc and Zosyn      Temp (24hrs), Avg:97.4 F (36.3 C), Min:97.2 F (36.2 C), Max:97.6 F (36.4 C)  Recent Labs  Lab 12/27/17 1901 12/27/17 1911 12/27/17 2100  WBC 10.2  --   --   CREATININE 2.26*  --   --   LATICACIDVEN  --  1.38 1.21    Estimated Creatinine Clearance: 19.9 mL/min (A) (by C-G formula based on SCr of 2.26 mg/dL (H)).    No Known Allergies   Thank you for allowing pharmacy to be a part of this patient's care.  Bernadene Personrew [name], PharmD, BCPS [phone] 12/27/2017, 9:55 PM

## 2017-12-28 ENCOUNTER — Other Ambulatory Visit: Payer: Self-pay

## 2017-12-28 DIAGNOSIS — I482 Chronic atrial fibrillation: Secondary | ICD-10-CM

## 2017-12-28 LAB — CBC
HEMATOCRIT: 28.1 % — AB (ref 39.0–52.0)
HEMOGLOBIN: 9.4 g/dL — AB (ref 13.0–17.0)
MCH: 33.3 pg (ref 26.0–34.0)
MCHC: 33.5 g/dL (ref 30.0–36.0)
MCV: 99.6 fL (ref 78.0–100.0)
Platelets: 393 10*3/uL (ref 150–400)
RBC: 2.82 MIL/uL — AB (ref 4.22–5.81)
RDW: 13.6 % (ref 11.5–15.5)
WBC: 9 10*3/uL (ref 4.0–10.5)

## 2017-12-28 LAB — RESPIRATORY PANEL BY PCR
ADENOVIRUS-RVPPCR: NOT DETECTED
Bordetella pertussis: NOT DETECTED
CORONAVIRUS 229E-RVPPCR: NOT DETECTED
CORONAVIRUS HKU1-RVPPCR: NOT DETECTED
CORONAVIRUS NL63-RVPPCR: NOT DETECTED
Chlamydophila pneumoniae: NOT DETECTED
Coronavirus OC43: NOT DETECTED
Influenza A: NOT DETECTED
Influenza B: NOT DETECTED
MYCOPLASMA PNEUMONIAE-RVPPCR: NOT DETECTED
Metapneumovirus: NOT DETECTED
Parainfluenza Virus 1: NOT DETECTED
Parainfluenza Virus 2: NOT DETECTED
Parainfluenza Virus 3: NOT DETECTED
Parainfluenza Virus 4: NOT DETECTED
Respiratory Syncytial Virus: NOT DETECTED
Rhinovirus / Enterovirus: NOT DETECTED

## 2017-12-28 LAB — COMPREHENSIVE METABOLIC PANEL
ALBUMIN: 2.3 g/dL — AB (ref 3.5–5.0)
ALT: 15 U/L — ABNORMAL LOW (ref 17–63)
ANION GAP: 11 (ref 5–15)
AST: 21 U/L (ref 15–41)
Alkaline Phosphatase: 110 U/L (ref 38–126)
BUN: 47 mg/dL — ABNORMAL HIGH (ref 6–20)
CHLORIDE: 109 mmol/L (ref 101–111)
CO2: 19 mmol/L — AB (ref 22–32)
Calcium: 8.6 mg/dL — ABNORMAL LOW (ref 8.9–10.3)
Creatinine, Ser: 2.01 mg/dL — ABNORMAL HIGH (ref 0.61–1.24)
GFR calc non Af Amer: 27 mL/min — ABNORMAL LOW (ref >60)
GFR, EST AFRICAN AMERICAN: 31 mL/min — AB (ref >60)
Glucose, Bld: 120 mg/dL — ABNORMAL HIGH (ref 65–99)
Potassium: 4.6 mmol/L (ref 3.5–5.1)
SODIUM: 139 mmol/L (ref 135–145)
Total Bilirubin: 0.8 mg/dL (ref 0.3–1.2)
Total Protein: 6 g/dL — ABNORMAL LOW (ref 6.5–8.1)

## 2017-12-28 LAB — MAGNESIUM: MAGNESIUM: 2.1 mg/dL (ref 1.7–2.4)

## 2017-12-28 LAB — INFLUENZA PANEL BY PCR (TYPE A & B)
INFLAPCR: NEGATIVE
INFLBPCR: NEGATIVE

## 2017-12-28 LAB — GLUCOSE, CAPILLARY
GLUCOSE-CAPILLARY: 146 mg/dL — AB (ref 65–99)
Glucose-Capillary: 102 mg/dL — ABNORMAL HIGH (ref 65–99)
Glucose-Capillary: 158 mg/dL — ABNORMAL HIGH (ref 65–99)

## 2017-12-28 LAB — PROTIME-INR
INR: 3.89
Prothrombin Time: 37.9 seconds — ABNORMAL HIGH (ref 11.4–15.2)

## 2017-12-28 LAB — PROCALCITONIN

## 2017-12-28 LAB — MRSA PCR SCREENING: MRSA BY PCR: NEGATIVE

## 2017-12-28 LAB — PHOSPHORUS: Phosphorus: 4.9 mg/dL — ABNORMAL HIGH (ref 2.5–4.6)

## 2017-12-28 LAB — STREP PNEUMONIAE URINARY ANTIGEN: Strep Pneumo Urinary Antigen: NEGATIVE

## 2017-12-28 LAB — CBG MONITORING, ED: GLUCOSE-CAPILLARY: 135 mg/dL — AB (ref 65–99)

## 2017-12-28 LAB — TSH: TSH: 0.182 u[IU]/mL — AB (ref 0.350–4.500)

## 2017-12-28 MED ORDER — QUINAPRIL HCL 10 MG PO TABS
20.0000 mg | ORAL_TABLET | Freq: Every day | ORAL | Status: DC
  Filled 2017-12-28: qty 2

## 2017-12-28 MED ORDER — WARFARIN - PHARMACIST DOSING INPATIENT: Freq: Every day | Status: DC

## 2017-12-28 MED ORDER — LISINOPRIL 20 MG PO TABS
20.0000 mg | ORAL_TABLET | Freq: Every day | ORAL | Status: DC
  Administered 2017-12-29: 20 mg via ORAL
  Filled 2017-12-28: qty 1

## 2017-12-28 NOTE — ED Notes (Signed)
Pt requesting to get up to go to the bathroom.  Pt assisted w/monitor leads but walked, holding IV pole steadily to bathroom to void, unassisted.

## 2017-12-28 NOTE — ED Notes (Signed)
ED TO INPATIENT HANDOFF REPORT  Name/Age/Gender Adam [name] 84 y.o. male  Code Status    Code Status Orders  (From admission, onward)        Start     Ordered   12/27/17 2352  Full code  Continuous     12/27/17 2352    Code Status History    Date Active Date Inactive Code Status Order ID Comments User Context   This patient has a current code status but no historical code status.    Advance Directive Documentation     Most Recent Value  Type of Advance Directive  Healthcare Power of Attorney, Living will  Pre-existing out of facility DNR order (yellow form or pink MOST form)  No data  "MOST" Form in Place?  No data      Home/SNF/Other Home  Chief Complaint pneumonia  Level of Care/Admitting Diagnosis ED Disposition    ED Disposition Condition Comment   Admit  Hospital Area: [facility] [[id number]]  Level of Care: Med-Surg [16]  Diagnosis: CAP (community acquired pneumonia) [222979]  Admitting Physician: Toy Baker [[id number]]  Attending Physician: Toy Baker [[id number]]  Estimated length of stay: 3 - 4 days  Certification:: I certify this patient will need inpatient services for at least 2 midnights  PT Class (Do Not Modify): Inpatient [101]  PT Acc Code (Do Not Modify): Private [1]       Medical History Past Medical History:  Diagnosis Date  . Atrial fibrillation ([place])   . CORONARY ARTERY DISEASE   . HYPERTENSION   . Leg pain    a. ABI (05/2014):  ABIs falsely elevated due to calcification; TBIs ok; dopplers without significant stenosis  . MURMUR   . NEPHROLITHIASIS, HX OF   . SINUS BRADYCARDIA   . VITAMIN B12 DEFICIENCY     Allergies No Known Allergies  IV Location/Drains/Wounds Patient Lines/Drains/Airways Status   Active Line/Drains/Airways    Name:   Placement date:   Placement time:   Site:   Days:   Peripheral IV 12/27/17 Left Antecubital   12/27/17    1905    Antecubital   1   Wound / Incision (Open or  Dehisced) 07/05/17 Laceration Leg Right;Lower   07/05/17    0809    Leg   176          Labs/Imaging Results for orders placed or performed during the hospital encounter of 12/27/17 (from the past 48 hour(s))  CBC with Differential     Status: Abnormal   Collection Time: 12/27/17  7:01 PM  Result Value Ref Range   WBC 10.2 4.0 - 10.5 K/uL   RBC 3.40 (L) 4.22 - 5.81 MIL/uL   Hemoglobin 11.0 (L) 13.0 - 17.0 g/dL   HCT 33.4 (L) 39.0 - 52.0 %   MCV 98.2 78.0 - 100.0 fL   MCH 32.4 26.0 - 34.0 pg   MCHC 32.9 30.0 - 36.0 g/dL   RDW 13.3 11.5 - 15.5 %   Platelets 454 (H) 150 - 400 K/uL   Neutrophils Relative % 65 %   Neutro Abs 6.7 1.7 - 7.7 K/uL   Lymphocytes Relative 22 %   Lymphs Abs 2.3 0.7 - 4.0 K/uL   Monocytes Relative 10 %   Monocytes Absolute 1.0 0.1 - 1.0 K/uL   Eosinophils Relative 2 %   Eosinophils Absolute 0.2 0.0 - 0.7 K/uL   Basophils Relative 1 %   Basophils Absolute 0.1 0.0 - 0.1 K/uL  Comment: Performed at [facility], [place] [address], [place], [place] [address]  Basic metabolic panel     Status: Abnormal   Collection Time: 12/27/17  7:01 PM  Result Value Ref Range   Sodium 139 135 - 145 mmol/L   Potassium 4.4 3.5 - 5.1 mmol/L   Chloride 104 101 - 111 mmol/L   CO2 19 (L) 22 - 32 mmol/L   Glucose, Bld 125 (H) 65 - 99 mg/dL   BUN 54 (H) 6 - 20 mg/dL   Creatinine, Ser 2.26 (H) 0.61 - 1.24 mg/dL   Calcium 9.7 8.9 - 10.3 mg/dL   GFR calc non Af Amer 24 (L) >60 mL/min   GFR calc Af Amer 27 (L) >60 mL/min    Comment: (NOTE) The eGFR has been calculated using the CKD EPI equation. This calculation has not been validated in all clinical situations. eGFR's persistently <60 mL/min signify possible Chronic Kidney Disease.    Anion gap 16 (H) 5 - 15    Comment: Performed at [facility], [place] [address] [place], [place] [address]  I-Stat CG4 Lactic Acid, ED     Status: None   Collection Time: 12/27/17  7:11 PM  Result Value Ref  Range   Lactic Acid, Venous 1.38 0.5 - 1.9 mmol/L  I-Stat CG4 Lactic Acid, ED     Status: None   Collection Time: 12/27/17  9:00 PM  Result Value Ref Range   Lactic Acid, Venous 1.21 0.5 - 1.9 mmol/L  Influenza panel by PCR (type A & B)     Status: None   Collection Time: 12/28/17 12:37 AM  Result Value Ref Range   Influenza A By PCR NEGATIVE NEGATIVE   Influenza B By PCR NEGATIVE NEGATIVE    Comment: (NOTE) The Xpert Xpress Flu assay is intended as an aid in the diagnosis of  influenza and should not be used as a sole basis for treatment.  This  assay is FDA approved for nasopharyngeal swab specimens only. Nasal  washings and aspirates are unacceptable for Xpert Xpress Flu testing. Performed at [facility], [place] [address], [place], [place] [address]   CBG monitoring, ED     Status: Abnormal   Collection Time: 12/28/17 12:41 AM  Result Value Ref Range   Glucose-Capillary 135 (H) 65 - 99 mg/dL  Protime-INR     Status: Abnormal   Collection Time: 12/28/17 12:42 AM  Result Value Ref Range   Prothrombin Time 37.9 (H) 11.4 - 15.2 seconds   INR 3.89     Comment: Performed at [facility], [place] [address] [place], [place] [address]  Strep pneumoniae urinary antigen     Status: None   Collection Time: 12/28/17 12:42 AM  Result Value Ref Range   Strep Pneumo Urinary Antigen NEGATIVE NEGATIVE    Comment:        Infection due to S. pneumoniae cannot be absolutely ruled out since the antigen present may be below the detection limit of the test. Performed at [facility], [address], [place], [place] [address]   Magnesium     Status: None   Collection Time: 12/28/17  4:15 AM  Result Value Ref Range   Magnesium 2.1 1.7 - 2.4 mg/dL    Comment: Performed at [facility], [place] [address] [place],  [address]  Phosphorus     Status: Abnormal   Collection Time: 12/28/17  4:15 AM  Result Value  Ref Range    Phosphorus 4.9 (H) 2.5 - 4.6 mg/dL    Comment: Performed at [facility], [place] [address] [place], [place] [address]  TSH     Status: Abnormal   Collection Time: 12/28/17  4:15 AM  Result Value Ref Range   TSH 0.182 (L) 0.350 - 4.500 uIU/mL    Comment: Performed by a 3rd Generation assay with a functional sensitivity of <=0.01 uIU/mL. Performed at [facility], [place] [address] [place], [place] [address]   Comprehensive metabolic panel     Status: Abnormal   Collection Time: 12/28/17  4:15 AM  Result Value Ref Range   Sodium 139 135 - 145 mmol/L   Potassium 4.6 3.5 - 5.1 mmol/L   Chloride 109 101 - 111 mmol/L   CO2 19 (L) 22 - 32 mmol/L   Glucose, Bld 120 (H) 65 - 99 mg/dL   BUN 47 (H) 6 - 20 mg/dL   Creatinine, Ser 2.01 (H) 0.61 - 1.24 mg/dL   Calcium 8.6 (L) 8.9 - 10.3 mg/dL   Total Protein 6.0 (L) 6.5 - 8.1 g/dL   Albumin 2.3 (L) 3.5 - 5.0 g/dL   AST 21 15 - 41 U/L   ALT 15 (L) 17 - 63 U/L   Alkaline Phosphatase 110 38 - 126 U/L   Total Bilirubin 0.8 0.3 - 1.2 mg/dL   GFR calc non Af Amer 27 (L) >60 mL/min   GFR calc Af Amer 31 (L) >60 mL/min    Comment: (NOTE) The eGFR has been calculated using the CKD EPI equation. This calculation has not been validated in all clinical situations. eGFR's persistently <60 mL/min signify possible Chronic Kidney Disease.    Anion gap 11 5 - 15    Comment: Performed at [facility], [place] [address], [place], [place] [address]  CBC     Status: Abnormal   Collection Time: 12/28/17  4:15 AM  Result Value Ref Range   WBC 9.0 4.0 - 10.5 K/uL   RBC 2.82 (L) 4.22 - 5.81 MIL/uL   Hemoglobin 9.4 (L) 13.0 - 17.0 g/dL   HCT 28.1 (L) 39.0 - 52.0 %   MCV 99.6 78.0 - 100.0 fL   MCH 33.3 26.0 - 34.0 pg   MCHC 33.5 30.0 - 36.0 g/dL   RDW 13.6 11.5 - 15.5 %   Platelets 393 150 - 400 K/uL    Comment: Performed at [facility], [place] [name], [place], [place] [address]   Dg  Chest 2 View  Result Date: 12/27/2017 CLINICAL DATA:  Pneumonia diagnosed 12 days ago, continued cough and fatigue, follow-up, history coronary artery disease, hypertension, atrial fibrillation EXAM: CHEST  2 VIEW COMPARISON:  12/15/2017 FINDINGS: Normal heart size, mediastinal contours, and pulmonary vascularity. Atherosclerotic calcifications aorta. Chronic bronchitic changes. Persistent lingular infiltrate consistent with pneumonia, slightly increased. Minimal atelectasis at RIGHT base. Probable small focus of infiltrate in the RIGHT upper lobe. No pleural effusion or pneumothorax. Bones demineralized. IMPRESSION: Bronchitic changes with persistent lingular and suspect subtle new RIGHT upper lobe infiltrates consistent with pneumonia. Electronically Signed   By: Lavonia Dana M.D.   On: 12/27/2017 11:59    Pending Labs Unresulted Labs (From admission, onward)   Start     Ordered   12/29/17 0500  Protime-INR  Daily,   R     12/27/17 2354   12/28/17 0500  Creatinine, serum  Daily,   R     12/27/17 2159  12/27/17 2352  Culture, sputum-assessment  Once,   R    Question:  Patient immune status  Answer:  Normal   12/27/17 2351   12/27/17 2352  Gram stain  Once,   R    Question:  Patient immune status  Answer:  Normal   12/27/17 2351   12/27/17 2352  Respiratory Panel by PCR  (Respiratory virus panel)  Once,   R    Question:  Patient immune status  Answer:  Normal   12/27/17 2351   12/27/17 1848  Blood culture (routine x 2)  BLOOD CULTURE X 2,   STAT     12/27/17 1847      Vitals/Pain Today's Vitals   12/28/17 0600 12/28/17 0700 12/28/17 0730 12/28/17 1125  BP: (!) 152/63 (!) 152/51 (!) 124/57   Pulse: (!) 54 61 60 73  Resp: '15 16 16 20  '$ Temp:      TempSrc:      SpO2: 94% 96% 93% 95%    Isolation Precautions Droplet precaution  Medications Medications  insulin aspart (novoLOG) injection 0-5 Units (0 Units Subcutaneous Not Given 12/28/17 0042)  insulin aspart (novoLOG) injection  0-9 Units (not administered)  vancomycin (VANCOCIN) IVPB 1000 mg/200 mL premix (not administered)  piperacillin-tazobactam (ZOSYN) IVPB 3.375 g (0 g Intravenous Stopped 12/28/17 1000)  amiodarone (PACERONE) tablet 100 mg (not administered)  aspirin EC tablet 81 mg (not administered)  atorvastatin (LIPITOR) tablet 40 mg (not administered)  acetaminophen (TYLENOL) tablet 650 mg (not administered)    Or  acetaminophen (TYLENOL) suppository 650 mg (not administered)  HYDROcodone-acetaminophen (NORCO/VICODIN) 5-325 MG per tablet 1-2 tablet (not administered)  ondansetron (ZOFRAN) tablet 4 mg (not administered)    Or  ondansetron (ZOFRAN) injection 4 mg (not administered)  0.9 %  sodium chloride infusion ( Intravenous New Bag/Given 12/28/17 0041)  guaiFENesin (MUCINEX) 12 hr tablet 600 mg (600 mg Oral Given 12/28/17 0041)  Warfarin - Pharmacist Dosing Inpatient (not administered)  lisinopril (PRINIVIL,ZESTRIL) tablet 20 mg (not administered)  vancomycin (VANCOCIN) IVPB 1000 mg/200 mL premix (0 mg Intravenous Stopped 12/27/17 2204)  sodium chloride 0.9 % bolus 1,000 mL (0 mLs Intravenous Stopped 12/27/17 2204)  0.9 %  sodium chloride infusion ( Intravenous New Bag/Given 12/27/17 2236)    Mobility walks

## 2017-12-28 NOTE — Progress Notes (Signed)
PROGRESS NOTE [facility]   EDI GORNIAK   WUJ:[id number] DOB: [birth year]  DOA: 12/27/2017 PCP: Lucky Cowboy, MD   Brief Narrative:  Adam [name] 84 year old male with medical history significant for hypertension, A. fib on Coumadin, CAD, diabetes mellitus type 2 and CKD stage III presented to the emergency department complaining of fatigue and cough.  Patient was seen by PCP and oxygenation was found to be 82% therefore he was sent to the emergency department for further evaluation.  Prior to the admission patient was treated with doxycycline for left pneumonia, but continued to be weak after completion of antibiotics.  Per PCP there was a concern of aspiration pneumonia as patient report trouble swallowing pills.  Upon ED evaluation chest x-ray found right upper lobe pneumonia and he was admitted with working diagnosis of pneumonia for antibiotic therapy and failure of outpatient treatment.  Subjective: Patient seen and examined, report feeling significantly better.  Denies any chest pain, shortness of breath.  Cough has improved and is minimal now.  Remains afebrile   Assessment & Plan: Community-acquired pneumonia - unlikely to be aspiration as the location of this pneumonia is in the right upper lobe.  Patient tolerating diet well and no difficulty swallowing pills.  Can DC IV fluids.  Continue antibiotic therapy Zosyn and vancomycin.  Will order MRSA screen if this is negative can discontinue vancomycin.  Will check pro-calcitonin.  De-escalate antibiotic therapy according to results.  Continue supportive treatment.  Viral panel pending, influenza PCR negative, strep antigen negative.  PT evaluated patient for weakness and recommended no follow-up  Chronic A. fib Rhythm and rate well controlled on amiodarone Currently on anticoagulation with Coumadin, INR elevated hold warfarin tonight  CKD stage III Creatinine currently at baseline Avoid hypotension and nephrotoxic  medications  Hypertension BP stable Continue home medications, Norvasc, Cardura and ACE  Type 2 diabetes mellitus CBGs seems to be stable A1c pending Holding oral hypoglycemic agent Continue SSI  DVT prophylaxis: Warfarin on hold due to elevated INR Code Status: Full code Family Communication: Wife at bedside Disposition Plan: Home in 24-48 hours   Consultants:   None  Procedures:   None  Antimicrobials: Anti-infectives (From admission, onward)   Start     Dose/Rate Route Frequency Ordered Stop   12/28/17 1400  vancomycin (VANCOCIN) IVPB 1000 mg/200 mL premix     1,000 mg 200 mL/hr over 60 Minutes Intravenous Every 48 hours 12/27/17 2159     12/27/17 2200  piperacillin-tazobactam (ZOSYN) IVPB 3.375 g     3.375 g 12.5 mL/hr over 240 Minutes Intravenous Every 8 hours 12/27/17 2159     12/27/17 2030  vancomycin (VANCOCIN) IVPB 1000 mg/200 mL premix     1,000 mg 200 mL/hr over 60 Minutes Intravenous  Once 12/27/17 1943 12/27/17 2204   12/27/17 2000  ceFEPIme (MAXIPIME) 2 g in sodium chloride 0.9 % 100 mL IVPB  Status:  Discontinued     2 g 200 mL/hr over 30 Minutes Intravenous  Once 12/27/17 1943 12/27/17 2132           Objective: Vitals:   12/28/17 0730 12/28/17 1125 12/28/17 1204 12/28/17 1613  BP: (!) 124/57  (!) 148/51 (!) 137/49  Pulse: 60 73 70 66  Resp: 16 20 17 18   Temp:   (!) 97.5 F (36.4 C) 97.6 F (36.4 C)  TempSrc:   Oral Oral  SpO2: 93% 95% 99% 97%    Intake/Output Summary (Last 24 hours) at 12/28/2017 1707 Last data  filed at 12/28/2017 1510 Gross per 24 hour  Intake 1965 ml  Output -  Net 1965 ml   There were no vitals filed for this visit.  Examination:  General exam: Appears calm and comfortable  HEENT: AC/AT, PERRLA, OP moist and clear, hard hearing Respiratory system: Good air entry, mild rales at the right lower lobe, no wheezing Cardiovascular system: S1-S2, irregularly irregular, positive systolic murmur Gastrointestinal  system: Abdomen is nondistended, soft and nontender. Central nervous system: Alert and oriented. No focal neurological deficits. Extremities: No pedal edema.  Skin: No rashes Psychiatry: Mood & affect appropriate.    Data Reviewed: I have personally reviewed following labs and imaging studies  CBC: Recent Labs  Lab 12/27/17 1901 12/28/17 0415  WBC 10.2 9.0  NEUTROABS 6.7  --   HGB 11.0* 9.4*  HCT 33.4* 28.1*  MCV 98.2 99.6  PLT 454* 393   Basic Metabolic Panel: Recent Labs  Lab 12/27/17 1901 12/28/17 0415  NA 139 139  K 4.4 4.6  CL 104 109  CO2 19* 19*  GLUCOSE 125* 120*  BUN 54* 47*  CREATININE 2.26* 2.01*  CALCIUM 9.7 8.6*  MG  --  2.1  PHOS  --  4.9*   GFR: Estimated Creatinine Clearance: 22.3 mL/min (A) (by C-G formula based on SCr of 2.01 mg/dL (H)). Liver Function Tests: Recent Labs  Lab 12/28/17 0415  AST 21  ALT 15*  ALKPHOS 110  BILITOT 0.8  PROT 6.0*  ALBUMIN 2.3*   No results for input(s): LIPASE, AMYLASE in the last 168 hours. No results for input(s): AMMONIA in the last 168 hours. Coagulation Profile: Recent Labs  Lab 12/28/17 0042  INR 3.89   Cardiac Enzymes: No results for input(s): CKTOTAL, CKMB, CKMBINDEX, TROPONINI in the last 168 hours. BNP (last 3 results) No results for input(s): PROBNP in the last 8760 hours. HbA1C: No results for input(s): HGBA1C in the last 72 hours. CBG: Recent Labs  Lab 12/28/17 0041 12/28/17 1229  GLUCAP 135* 102*   Lipid Profile: No results for input(s): CHOL, HDL, LDLCALC, TRIG, CHOLHDL, LDLDIRECT in the last 72 hours. Thyroid Function Tests: Recent Labs    12/28/17 0415  TSH 0.182*   Anemia Panel: No results for input(s): VITAMINB12, FOLATE, FERRITIN, TIBC, IRON, RETICCTPCT in the last 72 hours. Sepsis Labs: Recent Labs  Lab 12/27/17 1911 12/27/17 2100  LATICACIDVEN 1.38 1.21    Recent Results (from the past 240 hour(s))  Blood culture (routine x 2)     Status: None (Preliminary  result)   Collection Time: 12/27/17  7:02 PM  Result Value Ref Range Status   Specimen Description   Final    BLOOD LEFT ANTECUBITAL Performed at [facility], [address] [place], [place] [address]    Special Requests   Final    BOTTLES DRAWN AEROBIC AND ANAEROBIC Blood Culture adequate volume Performed at [facility], [address] [place], [place] [address]    Culture   Final    NO GROWTH < 24 HOURS Performed at [facility], [address] [place], [place] [address]    Report Status PENDING  Incomplete  Blood culture (routine x 2)     Status: None (Preliminary result)   Collection Time: 12/27/17  7:15 PM  Result Value Ref Range Status   Specimen Description   Final    BLOOD RIGHT ANTECUBITAL Performed at [facility], [address] [place], [place] [address]  Special Requests   Final    BOTTLES DRAWN AEROBIC AND ANAEROBIC Blood Culture adequate volume Performed at [facility], [address], [place], [place] [address]    Culture   Final    NO GROWTH < 24 HOURS Performed at [facility], [address], [place], [place] [address]    Report Status PENDING  Incomplete  Respiratory Panel by PCR     Status: None   Collection Time: 12/28/17 12:37 AM  Result Value Ref Range Status   Adenovirus NOT DETECTED NOT DETECTED Final   Coronavirus 229E NOT DETECTED NOT DETECTED Final   Coronavirus HKU1 NOT DETECTED NOT DETECTED Final   Coronavirus NL63 NOT DETECTED NOT DETECTED Final   Coronavirus OC43 NOT DETECTED NOT DETECTED Final   Metapneumovirus NOT DETECTED NOT DETECTED Final   Rhinovirus / Enterovirus NOT DETECTED NOT DETECTED Final   Influenza A NOT DETECTED NOT DETECTED Final   Influenza B NOT DETECTED NOT DETECTED Final   Parainfluenza Virus 1 NOT DETECTED NOT DETECTED Final   Parainfluenza Virus 2 NOT DETECTED NOT DETECTED Final   Parainfluenza Virus 3 NOT DETECTED NOT  DETECTED Final   Parainfluenza Virus 4 NOT DETECTED NOT DETECTED Final   Respiratory Syncytial Virus NOT DETECTED NOT DETECTED Final   Bordetella pertussis NOT DETECTED NOT DETECTED Final   Chlamydophila pneumoniae NOT DETECTED NOT DETECTED Final   Mycoplasma pneumoniae NOT DETECTED NOT DETECTED Final    Comment: Performed at [facility], [address], [place], [place] [address]      Radiology Studies: Dg Chest 2 View  Result Date: 12/27/2017 CLINICAL DATA:  Pneumonia diagnosed 12 days ago, continued cough and fatigue, follow-up, history coronary artery disease, hypertension, atrial fibrillation EXAM: CHEST  2 VIEW COMPARISON:  12/15/2017 FINDINGS: Normal heart size, mediastinal contours, and pulmonary vascularity. Atherosclerotic calcifications aorta. Chronic bronchitic changes. Persistent lingular infiltrate consistent with pneumonia, slightly increased. Minimal atelectasis at RIGHT base. Probable small focus of infiltrate in the RIGHT upper lobe. No pleural effusion or pneumothorax. Bones demineralized. IMPRESSION: Bronchitic changes with persistent lingular and suspect subtle new RIGHT upper lobe infiltrates consistent with pneumonia. Electronically Signed   By: Ulyses Southward M.D.   On: 12/27/2017 11:59    Scheduled Meds: . amiodarone  100 mg Oral Daily  . aspirin EC  81 mg Oral Daily  . atorvastatin  40 mg Oral Daily  . guaiFENesin  600 mg Oral BID  . insulin aspart  0-5 Units Subcutaneous QHS  . insulin aspart  0-9 Units Subcutaneous TID WC  . lisinopril  20 mg Oral Daily  . Warfarin - Pharmacist Dosing Inpatient   Does not apply q1800   Continuous Infusions: . piperacillin-tazobactam (ZOSYN)  IV 3.375 g (12/28/17 1345)  . vancomycin Stopped (12/28/17 1510)     LOS: 1 day    Time spent: Total of 25 minutes spent with pt, greater than 50% of which was spent in discussion of  treatment, counseling and coordination of care   Latrelle Dodrill, MD Pager: Text Page via  [email or web address]   If 7PM-7AM, please contact night-coverage [email or web address] 12/28/2017, 5:07 PM   Note - This record has been created using [employer]. Chart creation errors have been sought, but may not always have been located. Such creation errors do not reflect on the standard of medical care.

## 2017-12-28 NOTE — Evaluation (Signed)
Physical Therapy Evaluation Patient Details Name: Adam [name] MentionJames M Orosz MRN: [id number] DOB: 02/24/1923 Today's Date: 12/28/2017   History of Present Illness  84 yo male admitted with Pna. Hx of HTn, A fib, CKD, CAD, DM  Clinical Impression  On eval, pt was Sup-Min guard for mobility. He walked ~150 feet around the unit without an assistive device. No LOB with activity. O2 sat 98% on RA. Discussed d/c plan-pt will return home with family. He denies need for PT f/u at discharge. Will continue to follow during hospital stay.     Follow Up Recommendations No PT follow up;Supervision for mobility/OOB    Equipment Recommendations  None recommended by PT    Recommendations for Other Services       Precautions / Restrictions Precautions Precautions: Fall Precaution Comments: droplet Restrictions Weight Bearing Restrictions: No      Mobility  Bed Mobility Overal bed mobility: Needs Assistance Bed Mobility: Supine to Sit;Sit to Supine     Supine to sit: Supervision Sit to supine: Supervision   General bed mobility comments: for safety, lines  Transfers Overall transfer level: Needs assistance   Transfers: Sit to/from Stand Sit to Stand: Supervision         General transfer comment: for safety  Ambulation/Gait Ambulation/Gait assistance: Min guard;Supervision Ambulation Distance (Feet): 150 Feet Assistive device: None Gait Pattern/deviations: Step-through pattern;Decreased stride length     General Gait Details: slow gait speed. Pt tends to take short steps. No LOB. O2 sat 98% on RA, dyspnea 2/4.  Stairs            Wheelchair Mobility    Modified Rankin (Stroke Patients Only)       Balance                                             Pertinent Vitals/Pain      Home Living Family/patient expects to be discharged to:: Private residence Living Arrangements: Spouse/significant other Available Help at Discharge: Family Type of Home:  House Home Access: Stairs to enter Entrance Stairs-Rails: Right Entrance Stairs-Number of Steps: 8 Home Layout: One level Home Equipment: Cane - quad;Cane - single point      Prior Function Level of Independence: Independent               Hand Dominance        Extremity/Trunk Assessment   Upper Extremity Assessment Upper Extremity Assessment: Overall WFL for tasks assessed    Lower Extremity Assessment Lower Extremity Assessment: Generalized weakness    Cervical / Trunk Assessment Cervical / Trunk Assessment: Normal  Communication   Communication: No difficulties  Cognition Arousal/Alertness: Awake/alert Behavior During Therapy: WFL for tasks assessed/performed Overall Cognitive Status: Within Functional Limits for tasks assessed                                        General Comments      Exercises     Assessment/Plan    PT Assessment Patient needs continued PT services  PT Problem List Decreased mobility       PT Treatment Interventions Gait training;Functional mobility training    PT Goals (Current goals can be found in the Care Plan section)  Acute Rehab PT Goals Patient Stated Goal: home PT Goal Formulation: With patient Time For  Goal Achievement: 01/11/18 Potential to Achieve Goals: Good    Frequency Min 3X/week   Barriers to discharge        Co-evaluation               AM-PAC PT "6 Clicks" Daily Activity  Outcome Measure Difficulty turning over in bed (including adjusting bedclothes, sheets and blankets)?: None Difficulty moving from lying on back to sitting on the side of the bed? : None Difficulty sitting down on and standing up from a chair with arms (e.g., wheelchair, bedside commode, etc,.)?: None Help needed moving to and from a bed to chair (including a wheelchair)?: A Little Help needed walking in hospital room?: A Little Help needed climbing 3-5 steps with a railing? : A Little 6 Click Score: 21     End of Session Equipment Utilized During Treatment: Gait belt Activity Tolerance: Patient tolerated treatment well Patient left: in bed;with call bell/phone within reach;with family/visitor present   PT Visit Diagnosis: Difficulty in walking, not elsewhere classified (R26.2)    Time: 1610-9604 PT Time Calculation (min) (ACUTE ONLY): 19 min   Charges:   PT Evaluation $PT Eval Moderate Complexity: 1 Mod     PT G Codes:          Rebeca Alert, MPT Pager: [phone]

## 2017-12-28 NOTE — ED Notes (Signed)
Pt has finished w/breakfast tray.  Ate 80% of meal.  Wife is at bedside. Call bell in reach and pt has no complaints.

## 2017-12-28 NOTE — Plan of Care (Signed)
  Activity: Risk for activity intolerance will decrease 12/28/2017 1518 - Progressing by [name] Daltonarpenter, Landrey Mahurin L, RN   Nutrition: Adequate nutrition will be maintained 12/28/2017 1518 - Progressing by [name] Daltonarpenter, Lillard Bailon L, RN   Respiratory: Ability to maintain adequate ventilation will improve 12/28/2017 1518 - Progressing by [name] Daltonarpenter, Wynne Rozak L, RN

## 2017-12-28 NOTE — ED Notes (Signed)
Pharmacy to send morning meds.  

## 2017-12-29 LAB — BASIC METABOLIC PANEL
ANION GAP: 9 (ref 5–15)
BUN: 43 mg/dL — ABNORMAL HIGH (ref 6–20)
CALCIUM: 9.3 mg/dL (ref 8.9–10.3)
CO2: 22 mmol/L (ref 22–32)
CREATININE: 1.96 mg/dL — AB (ref 0.61–1.24)
Chloride: 108 mmol/L (ref 101–111)
GFR, EST AFRICAN AMERICAN: 32 mL/min — AB (ref >60)
GFR, EST NON AFRICAN AMERICAN: 28 mL/min — AB (ref >60)
Glucose, Bld: 129 mg/dL — ABNORMAL HIGH (ref 65–99)
Potassium: 4.5 mmol/L (ref 3.5–5.1)
Sodium: 139 mmol/L (ref 135–145)

## 2017-12-29 LAB — CBC
HCT: 29.6 % — ABNORMAL LOW (ref 39.0–52.0)
HEMOGLOBIN: 9.8 g/dL — AB (ref 13.0–17.0)
MCH: 33.2 pg (ref 26.0–34.0)
MCHC: 33.1 g/dL (ref 30.0–36.0)
MCV: 100.3 fL — ABNORMAL HIGH (ref 78.0–100.0)
PLATELETS: 390 10*3/uL (ref 150–400)
RBC: 2.95 MIL/uL — AB (ref 4.22–5.81)
RDW: 13.7 % (ref 11.5–15.5)
WBC: 9.9 10*3/uL (ref 4.0–10.5)

## 2017-12-29 LAB — PROTIME-INR
INR: 3.94
PROTHROMBIN TIME: 38.3 s — AB (ref 11.4–15.2)

## 2017-12-29 LAB — GLUCOSE, CAPILLARY: GLUCOSE-CAPILLARY: 107 mg/dL — AB (ref 65–99)

## 2017-12-29 MED ORDER — GUAIFENESIN ER 600 MG PO TB12: 600.0000 mg | ORAL_TABLET | Freq: Two times a day (BID) | ORAL | 0 refills | Status: DC

## 2017-12-29 MED ORDER — WARFARIN SODIUM 2.5 MG PO TABS: ORAL_TABLET | ORAL | Status: DC

## 2017-12-29 MED ORDER — CEFDINIR 300 MG PO CAPS: 300.0000 mg | ORAL_CAPSULE | Freq: Two times a day (BID) | ORAL | 0 refills | Status: DC

## 2017-12-29 NOTE — Discharge Summary (Signed)
Physician Discharge Summary  ZAYQUAN BOGARD YKD:[id number] DOB: [birth year] DOA: 12/27/2017  PCP: Unk Pinto, MD  Admit date: 12/27/2017 Discharge date: 12/29/2017  Admitted From: Home Disposition:  Home  Discharge Condition:Stable CODE STATUS:Full Diet recommendation: Heart Healthy   Brief/Interim Summary:  Adam [name] 84 year old male with medical history significant for hypertension, A. fib on Coumadin, CAD, diabetes mellitus type 2 and CKD stage III presented to the emergency department complaining of fatigue and cough.  Patient was seen by PCP and oxygenation was found to be 82% therefore he was sent to the emergency department for further evaluation.  Prior to the admission patient was treated with doxycycline for left pneumonia, but continued to be weak after completion of antibiotics.  Per PCP there was a concern of aspiration pneumonia as patient report trouble swallowing pills.  Upon ED evaluation chest x-ray found right upper lobe pneumonia and he was admitted with working diagnosis of pneumonia for antibiotic therapy and failure of outpatient treatment. Patient was started on broad-spectrum antibiotics with vancomycin and Zosyn.  His respiratory symptoms remarkably improved within a few days currently he is afebrile.  His white cell counts are stable.  Cultures are negative. Patient is stable for discharge to home today with oral antibiotics. Patient has been advised to follow-up with his PMD within a week.  Following problems were addressed during the his hospitalization :  Community-acquired pneumonia - unlikely to be aspiration as the location of this pneumonia is in the right upper lobe.  Patient tolerating diet well and no difficulty swallowing pills.   Started on antibiotic therapy Zosyn and vancomycin.  MRSA screen negative. De-escalated antibiotic to omnicef. Influenza PCR negative, strep antigen negative.  PT evaluated patient for weakness and recommended no  follow-up.  Chronic A. fib Rhythm and rate well controlled on amiodarone Currently on anticoagulation with Coumadin. His INR is elevated more than 3.  We have recommended to hold taking Coumadin for at least today and tomorrow and check his INR at Coumadin clinic tomorrow.  CKD stage III Creatinine currently at baseline Avoid hypotension and nephrotoxic medications  Hypertension BP stable Continue home medications, Norvasc, Cardura and ACE  Type 2 diabetes mellitus Continue his home regimen.  Low TSH: Patient was taking levothyroxine in the past but is not taking now.  His TSH was found to be low.  We will advise to recheck TSH and T4 in 4 weeks.  Discharge Diagnoses:  Active Problems:   Essential hypertension   Atrial fibrillation (HCC)   Type II diabetes mellitus with renal manifestations (HCC)   Hyperlipidemia   CKD (chronic kidney disease) stage 3, GFR 30-59 ml/min (HCC)   CAP (community acquired pneumonia)   Dysphasia    Discharge Instructions  Discharge Instructions    Diet - low sodium heart healthy   Complete by:  As directed    Discharge instructions   Complete by:  As directed    1) Follow up with your PCP within a week. 2) Take prescribed medication as instructed. 3)Please do not take coumadin today or tomorrow. Check INR at your coumadin clinic tomorrow .Continue holding it until the INR falls below 3. 4)Check TSH and free T4 in 4 weeks.   Increase activity slowly   Complete by:  As directed      Allergies as of 12/29/2017   No Known Allergies     Medication List    STOP taking these medications   amoxicillin-clavulanate 875-125 MG tablet Commonly known as:  AUGMENTIN  levothyroxine 88 MCG tablet Commonly known as:  SYNTHROID, LEVOTHROID     TAKE these medications   amiodarone 200 MG tablet Commonly known as:  PACERONE Take 0.5 tablets (100 mg total) by mouth daily.   aspirin 81 MG tablet Take 81 mg by mouth daily.   atorvastatin 40  MG tablet Commonly known as:  LIPITOR Take 1 tablet (40 mg total) by mouth daily.   blood glucose meter kit and supplies Test blood sugars once daily Dx: E11.9   cefdinir 300 MG capsule Commonly known as:  OMNICEF Take 1 capsule (300 mg total) by mouth 2 (two) times daily.   doxazosin 4 MG tablet Commonly known as:  CARDURA Take 1 tablet at bedtime for prostate   guaiFENesin 600 MG 12 hr tablet Commonly known as:  MUCINEX Take 1 tablet (600 mg total) by mouth 2 (two) times daily.   quinapril 20 MG tablet Commonly known as:  ACCUPRIL TAKE ONE TABLET BY MOUTH DAILY   Vitamin D-3 1000 units Caps Take 4,000 Units by mouth daily.   warfarin 2.5 MG tablet Commonly known as:  COUMADIN Take as directed. If you are unsure how to take this medication, talk to your nurse or doctor. Original instructions:  Take as directed by the coumadin clinic. Please do not take coumadin today or tomorrow. Check INR at your coumadin clinic tomorrow .Continue holding it until the INR falls below 3. What changed:  additional instructions      Follow-up Information    Unk Pinto, MD. Schedule an appointment as soon as possible for a visit in 1 week(s).   Specialty:  Internal Medicine Contact information: [address] [place] [address] [phone]          No Known Allergies  Consultations:  None   Procedures/Studies: Dg Chest 2 View  Result Date: 12/27/2017 CLINICAL DATA:  Pneumonia diagnosed 12 days ago, continued cough and fatigue, follow-up, history coronary artery disease, hypertension, atrial fibrillation EXAM: CHEST  2 VIEW COMPARISON:  12/15/2017 FINDINGS: Normal heart size, mediastinal contours, and pulmonary vascularity. Atherosclerotic calcifications aorta. Chronic bronchitic changes. Persistent lingular infiltrate consistent with pneumonia, slightly increased. Minimal atelectasis at RIGHT base. Probable small focus of infiltrate in the RIGHT upper  lobe. No pleural effusion or pneumothorax. Bones demineralized. IMPRESSION: Bronchitic changes with persistent lingular and suspect subtle new RIGHT upper lobe infiltrates consistent with pneumonia. Electronically Signed   By: Lavonia Dana M.D.   On: 12/27/2017 11:59   Dg Chest 2 View  Result Date: 12/15/2017 CLINICAL DATA:  Cough for several weeks. Left lung wheezing on exam. EXAM: CHEST  2 VIEW COMPARISON:  08/11/2013 FINDINGS: Normal heart size. Ectasia and atherosclerotic calcification of thoracic aorta is stable. New airspace disease is seen in the anterior left upper lobe, suspicious for pneumonia. Right lung is clear. No evidence of pleural effusion. IMPRESSION: New anterior left upper lobe airspace disease, suspicious for pneumonia. Recommend clinical correlation, and followup PA and lateral chest X-ray in several weeks to ensure resolution. Electronically Signed   By: Earle Gell M.D.   On: 12/15/2017 12:50    (Echo, Carotid, EGD, Colonoscopy, ERCP)    Subjective: Patient seen and examined the bedside this morning.  Remains comfortable.  Afebrile.  Shortness of breath and cough have improved.  Stable for discharge to home today.   Discharge Exam: Vitals:   12/29/17 0048 12/29/17 0525  BP: (!) 155/56 (!) 139/52  Pulse: 67 74  Resp: 20 20  Temp: 98 F (36.7 C)  98.1 F (36.7 C)  SpO2: 94% 95%   Vitals:   12/28/17 1958 12/28/17 2150 12/29/17 0048 12/29/17 0525  BP:  (!) 143/60 (!) 155/56 (!) 139/52  Pulse:  67 67 74  Resp:  '20 20 20  '$ Temp:  97.9 F (36.6 C) 98 F (36.7 C) 98.1 F (36.7 C)  TempSrc:  Oral Oral Oral  SpO2:  96% 94% 95%  Weight: 70.5 kg (155 lb 6.8 oz)     Height: '5\' 7"'$  (1.702 m)       General: Pt is alert, awake, not in acute distress Cardiovascular: RRR, S1/S2 +, no rubs, no gallops Respiratory: Mild Crackles on the bases bilaterally Abdominal: Soft, NT, ND, bowel sounds + Extremities: no edema, no cyanosis    The results of significant diagnostics  from this hospitalization (including imaging, microbiology, ancillary and laboratory) are listed below for reference.     Microbiology: Recent Results (from the past 240 hour(s))  Blood culture (routine x 2)     Status: None (Preliminary result)   Collection Time: 12/27/17  7:02 PM  Result Value Ref Range Status   Specimen Description   Final    BLOOD LEFT ANTECUBITAL Performed at [facility] [address], [place], [place] [address]    Special Requests   Final    BOTTLES DRAWN AEROBIC AND ANAEROBIC Blood Culture adequate volume Performed at [facility] [address] [place], [place] [address]    Culture   Final    NO GROWTH 2 DAYS Performed at [facility] [address] [place], [place] [address]    Report Status PENDING  Incomplete  Blood culture (routine x 2)     Status: None (Preliminary result)   Collection Time: 12/27/17  7:15 PM  Result Value Ref Range Status   Specimen Description   Final    BLOOD RIGHT ANTECUBITAL Performed at [facility] [address] [place], [place] [address]    Special Requests   Final    BOTTLES DRAWN AEROBIC AND ANAEROBIC Blood Culture adequate volume Performed at [facility] [address], [place], [place] [address]    Culture   Final    NO GROWTH 2 DAYS Performed at [facility] [address] [place], [place] [address]    Report Status PENDING  Incomplete  Respiratory Panel by PCR     Status: None   Collection Time: 12/28/17 12:37 AM  Result Value Ref Range Status   Adenovirus NOT DETECTED NOT DETECTED Final   Coronavirus 229E NOT DETECTED NOT DETECTED Final   Coronavirus HKU1 NOT DETECTED NOT DETECTED Final   Coronavirus NL63 NOT DETECTED NOT DETECTED Final   Coronavirus OC43 NOT DETECTED NOT DETECTED Final   Metapneumovirus NOT DETECTED NOT DETECTED Final   Rhinovirus / Enterovirus NOT DETECTED NOT DETECTED Final   Influenza A  NOT DETECTED NOT DETECTED Final   Influenza B NOT DETECTED NOT DETECTED Final   Parainfluenza Virus 1 NOT DETECTED NOT DETECTED Final   Parainfluenza Virus 2 NOT DETECTED NOT DETECTED Final   Parainfluenza Virus 3 NOT DETECTED NOT DETECTED Final   Parainfluenza Virus 4 NOT DETECTED NOT DETECTED Final   Respiratory Syncytial Virus NOT DETECTED NOT DETECTED Final   Bordetella pertussis NOT DETECTED NOT DETECTED Final   Chlamydophila pneumoniae NOT DETECTED NOT DETECTED Final   Mycoplasma pneumoniae NOT DETECTED NOT DETECTED Final    Comment: Performed at [facility], [address] [place], [place] [address]  MRSA PCR Screening     Status: None   Collection Time: 12/28/17  5:24 PM  Result Value Ref Range Status   MRSA by PCR NEGATIVE NEGATIVE Final    Comment:        The GeneXpert MRSA Assay (FDA approved for NASAL specimens only), is one component of a comprehensive MRSA colonization surveillance program. It is not intended to diagnose MRSA infection nor to guide or monitor treatment for MRSA infections. Performed at [facility], [place] [address], [place], [place] [address]      Labs: BNP (last 3 results) No results for input(s): BNP in the last 8760 hours. Basic Metabolic Panel: Recent Labs  Lab 12/27/17 1901 12/28/17 0415 12/29/17 0419  NA 139 139 139  K 4.4 4.6 4.5  CL 104 109 108  CO2 19* 19* 22  GLUCOSE 125* 120* 129*  BUN 54* 47* 43*  CREATININE 2.26* 2.01* 1.96*  CALCIUM 9.7 8.6* 9.3  MG  --  2.1  --   PHOS  --  4.9*  --    Liver Function Tests: Recent Labs  Lab 12/28/17 0415  AST 21  ALT 15*  ALKPHOS 110  BILITOT 0.8  PROT 6.0*  ALBUMIN 2.3*   No results for input(s): LIPASE, AMYLASE in the last 168 hours. No results for input(s): AMMONIA in the last 168 hours. CBC: Recent Labs  Lab 12/27/17 1901 12/28/17 0415 12/29/17 0419  WBC 10.2 9.0 9.9  NEUTROABS 6.7  --   --   HGB 11.0* 9.4* 9.8*  HCT 33.4* 28.1* 29.6*   MCV 98.2 99.6 100.3*  PLT 454* 393 390   Cardiac Enzymes: No results for input(s): CKTOTAL, CKMB, CKMBINDEX, TROPONINI in the last 168 hours. BNP: Invalid input(s): POCBNP CBG: Recent Labs  Lab 12/28/17 0041 12/28/17 1229 12/28/17 1723 12/28/17 2155 12/29/17 0747  GLUCAP 135* 102* 146* 158* 107*   D-Dimer No results for input(s): DDIMER in the last 72 hours. Hgb A1c No results for input(s): HGBA1C in the last 72 hours. Lipid Profile No results for input(s): CHOL, HDL, LDLCALC, TRIG, CHOLHDL, LDLDIRECT in the last 72 hours. Thyroid function studies Recent Labs    12/28/17 0415  TSH 0.182*   Anemia work up No results for input(s): VITAMINB12, FOLATE, FERRITIN, TIBC, IRON, RETICCTPCT in the last 72 hours. Urinalysis    Component Value Date/Time   COLORURINE YELLOW 10/29/2017 1115   APPEARANCEUR CLEAR 10/29/2017 1115   LABSPEC 1.017 10/29/2017 1115   PHURINE < OR = 5.0 10/29/2017 1115   GLUCOSEU TRACE (A) 10/29/2017 1115   HGBUR NEGATIVE 10/29/2017 1115   BILIRUBINUR NEGATIVE 10/01/2016 1142   KETONESUR NEGATIVE 10/29/2017 1115   PROTEINUR NEGATIVE 10/29/2017 1115   NITRITE NEGATIVE 10/29/2017 1115   LEUKOCYTESUR NEGATIVE 10/29/2017 1115   Sepsis Labs Invalid input(s): PROCALCITONIN,  WBC,  LACTICIDVEN Microbiology Recent Results (from the past 240 hour(s))  Blood culture (routine x 2)     Status: None (Preliminary result)   Collection Time: 12/27/17  7:02 PM  Result Value Ref Range Status   Specimen Description   Final    BLOOD LEFT ANTECUBITAL Performed at [facility], [place] [address], [place], [place] [address]    Special Requests   Final    BOTTLES DRAWN AEROBIC AND ANAEROBIC Blood Culture adequate volume Performed at [facility] [address], [place], [place] [address]    Culture   Final    NO  GROWTH 2 DAYS Performed at [facility], [place] [address], [place], [place] [address]    Report Status PENDING   Incomplete  Blood culture (routine x 2)     Status: None (Preliminary result)   Collection Time: 12/27/17  7:15 PM  Result Value Ref Range Status   Specimen Description   Final    BLOOD RIGHT ANTECUBITAL Performed at [facility] [address] [place], [place] [address]    Special Requests   Final    BOTTLES DRAWN AEROBIC AND ANAEROBIC Blood Culture adequate volume Performed at [facility] [address], [place], [place] [address]    Culture   Final    NO GROWTH 2 DAYS Performed at [facility] [address] [place], [place] [address]    Report Status PENDING  Incomplete  Respiratory Panel by PCR     Status: None   Collection Time: 12/28/17 12:37 AM  Result Value Ref Range Status   Adenovirus NOT DETECTED NOT DETECTED Final   Coronavirus 229E NOT DETECTED NOT DETECTED Final   Coronavirus HKU1 NOT DETECTED NOT DETECTED Final   Coronavirus NL63 NOT DETECTED NOT DETECTED Final   Coronavirus OC43 NOT DETECTED NOT DETECTED Final   Metapneumovirus NOT DETECTED NOT DETECTED Final   Rhinovirus / Enterovirus NOT DETECTED NOT DETECTED Final   Influenza A NOT DETECTED NOT DETECTED Final   Influenza B NOT DETECTED NOT DETECTED Final   Parainfluenza Virus 1 NOT DETECTED NOT DETECTED Final   Parainfluenza Virus 2 NOT DETECTED NOT DETECTED Final   Parainfluenza Virus 3 NOT DETECTED NOT DETECTED Final   Parainfluenza Virus 4 NOT DETECTED NOT DETECTED Final   Respiratory Syncytial Virus NOT DETECTED NOT DETECTED Final   Bordetella pertussis NOT DETECTED NOT DETECTED Final   Chlamydophila pneumoniae NOT DETECTED NOT DETECTED Final   Mycoplasma pneumoniae NOT DETECTED NOT DETECTED Final    Comment: Performed at [facility], [place] [address] [place],  [address]  MRSA PCR Screening     Status: None   Collection Time: 12/28/17  5:24 PM  Result Value Ref Range Status   MRSA by PCR NEGATIVE NEGATIVE Final    Comment:        The  GeneXpert MRSA Assay (FDA approved for NASAL specimens only), is one component of a comprehensive MRSA colonization surveillance program. It is not intended to diagnose MRSA infection nor to guide or monitor treatment for MRSA infections. Performed at [facility], [place] [address], [place],  [address]      Time coordinating discharge: Over 30 minutes  SIGNED:   Marene Lenz, MD  [facility] 12/29/2017, 10:46 AM Pager [phone]  If 7PM-7AM, please contact night-coverage [email or web address] Password [email or web address]

## 2017-12-29 NOTE — Plan of Care (Signed)
  Respiratory: Ability to maintain adequate ventilation will improve 12/29/2017 1124 - Progressing by [name] Daltonarpenter, Mecca Guitron L, RN   Safety: Ability to remain free from injury will improve 12/29/2017 1124 - Progressing by [name] Daltonarpenter, Willo Yoon L, RN   Skin Integrity: Risk for impaired skin integrity will decrease 12/29/2017 1124 - Progressing by [name] Daltonarpenter, Gilberto Stanforth L, RN

## 2017-12-29 NOTE — Progress Notes (Signed)
12/29/17  1155  Reviewed discharge instructions with patient. Patient verbalized understanding of discharge instructions. Copy of discharge instructions given to patient.

## 2017-12-29 NOTE — Evaluation (Signed)
Clinical/Bedside Swallow Evaluation Patient Details  Name: Adam [name] MRN: [id number] Date of Birth: 02/06/1923  Today's Date: 12/29/2017 Time: SLP Start Time (ACUTE ONLY): 1050 SLP Stop Time (ACUTE ONLY): 1110 SLP Time Calculation (min) (ACUTE ONLY): 20 min  Past Medical History:  Past Medical History:  Diagnosis Date  . Atrial fibrillation (HCC)   . CORONARY ARTERY DISEASE   . HYPERTENSION   . Leg pain    a. ABI (05/2014):  ABIs falsely elevated due to calcification; TBIs ok; dopplers without significant stenosis  . MURMUR   . NEPHROLITHIASIS, HX OF   . SINUS BRADYCARDIA   . VITAMIN B12 DEFICIENCY    Past Surgical History:  Past Surgical History:  Procedure Laterality Date  . ANGIOPLASTY     HPI:  84 year old male admitted 12/27/16 with fever and cough. PMH significant for HTN, AFib, CAD, DM2, CKD3. CXR = RUL infiltrate   Assessment / Plan / Recommendation Clinical Impression  Pt presents with adequate oral motor strength and function. Adequate dentition. Prior to admit, pt was tolerating regular solids and thin liquids, and reports taking multiple pills at one time. No overt s/s aspiration observed or reported on any consistency during this assessment. RN provided multiple pills to pt, which he took with water without overt difficulty. Pt/family were informed of the option of a modified barium swallow study (MBS) if objective assessment of swallow function and safety is desired. This can be completed on an outpatient basis. Will continue regular diet and thin liquids. No further ST intervention recommended at this time. Please reconsult if needs arise.    SLP Visit Diagnosis: Dysphagia, unspecified (R13.10)    Aspiration Risk  Mild aspiration risk    Diet Recommendation Thin liquid;Regular   Liquid Administration via: Cup;Straw Medication Administration: Whole meds with liquid Supervision: Patient able to self feed Compensations: Minimize environmental  distractions;Slow rate;Small sips/bites Postural Changes: Seated upright at 90 degrees    Other  Recommendations Oral Care Recommendations: Oral care BID             Prognosis Prognosis for Safe Diet Advancement: Good      Swallow Study   General Date of Onset: 12/27/17 HPI: 84 year old male admitted 12/27/16 with fever and cough. PMH significant for HTN, AFib, CAD, DM2, CKD3. CXR = RUL infiltrate Type of Study: Bedside Swallow Evaluation Previous Swallow Assessment: no prior ST intervention Diet Prior to this Study: Regular;Thin liquids Temperature Spikes Noted: No Respiratory Status: Room air History of Recent Intubation: No Behavior/Cognition: Alert;Cooperative;Pleasant mood Oral Cavity Assessment: Within Functional Limits Oral Care Completed by SLP: No Oral Cavity - Dentition: Adequate natural dentition Vision: Functional for self-feeding Self-Feeding Abilities: Able to feed self Patient Positioning: Upright in bed Baseline Vocal Quality: Normal Volitional Cough: Strong Volitional Swallow: Able to elicit    Oral/Motor/Sensory Function Overall Oral Motor/Sensory Function: Within functional limits   Ice Chips Ice chips: Not tested   Thin Liquid Thin Liquid: Within functional limits Presentation: Cup;Self Fed;Straw    Nectar Thick Nectar Thick Liquid: Not tested   Honey Thick Honey Thick Liquid: Not tested   Puree Puree: Within functional limits Presentation: Spoon;Self Fed   Solid   GO   Solid: Within functional limits Presentation: Self Fed       Adam [name], [facility]MSP, CCC-SLP Speech Language Pathologist [phone]  Adam [name], Adam [name] 12/29/2017,11:18 AM

## 2017-12-30 ENCOUNTER — Ambulatory Visit (INDEPENDENT_AMBULATORY_CARE_PROVIDER_SITE_OTHER): Payer: Medicare Other | Admitting: Pharmacist

## 2017-12-30 DIAGNOSIS — Z5181 Encounter for therapeutic drug level monitoring: Secondary | ICD-10-CM | POA: Diagnosis not present

## 2017-12-30 DIAGNOSIS — I482 Chronic atrial fibrillation, unspecified: Secondary | ICD-10-CM

## 2017-12-30 DIAGNOSIS — I4891 Unspecified atrial fibrillation: Secondary | ICD-10-CM

## 2017-12-30 LAB — POCT INR: INR: 3.1

## 2017-12-30 NOTE — Patient Instructions (Addendum)
Description   Skip today then continue on same dosage 1 tablet daily except 1/2 tablet on Mondays. Recheck INR in 2 weeks.

## 2017-12-31 ENCOUNTER — Other Ambulatory Visit: Payer: Self-pay | Admitting: *Deleted

## 2017-12-31 MED ORDER — WARFARIN SODIUM 2.5 MG PO TABS: ORAL_TABLET | ORAL | 0 refills | Status: AC

## 2018-01-01 LAB — CULTURE, BLOOD (ROUTINE X 2)
CULTURE: NO GROWTH
Culture: NO GROWTH
SPECIAL REQUESTS: ADEQUATE
Special Requests: ADEQUATE

## 2018-01-02 NOTE — Progress Notes (Signed)
Hospital follow up  Assessment and Plan: Hospital visit follow up for RUL pneumonia:  Hospital discharge meds were reviewed, and reconciled with the patient.   Community acquired pneumonia of right upper lobe of lung ([place]) Symptoms significantly improved; clear to auscultation VS stable, - discussed ok to continue holding off with antibiotic, continue to monitor closely, with any increasing purulence of cough, dyspnea, chest pain please call -  -     DG Chest 2 View; Future  Essential hypertension At goal; continue medications Monitor blood pressure at home; call if consistently over 130/80 Continue DASH diet.   Reminder to go to the ER if any CP, SOB, nausea, dizziness, severe HA, changes vision/speech, left arm numbness and tingling and jaw pain.  Chronic atrial fibrillation (HCC) Continue coumadin dosing as managed by coumadin clinic No missed doses; denies falls Rate controlled today; on amiodarone therapy  Hypothyroidism, unspecified type Advised to stop taking synthroid due to recent low TSHs - recheck TSH, T4 at follow up appointment scheduled in 1 month  CKD (chronic kidney disease) stage 3, GFR 30-59 ml/min (HCC) Some decline while hospitalized; recheck BMP/GFR today, avoid nephrotoxic medications Encouraged sufficient hydration  Hold levothyroxine   Over 40 minutes of exam, counseling, chart review, and complex, high/moderate level critical decision making was performed this visit.   Future Appointments  Date Time Provider [place]  01/14/2018 11:45 AM [facility]  01/31/2018  9:30 AM [facility] ECHO 1 [facility]  01/31/2018 11:00 AM Josue Hector, MD [facility]  02/08/2018 10:30 AM Liane Comber, NP [facility] None  05/31/2018 10:30 AM Unk Pinto, MD [facility] None  11/24/2018 10:00 AM Unk Pinto, MD [facility] None     HPI 84 y.o.male presents accompanied by his son for  follow up for transition from recent hospitalization. Admit date to the hospital was 12/27/17, patient was discharged from the hospital on 12/29/17 and our clinical staff contacted the office the day after discharge to set up a follow up appointment. The discharge summary, medications, and diagnostic test results were reviewed before meeting with the patient. The patient was admitted for:   Adam Luppino Fogleman14 year old male with medical history significant for hypertension, A. fib on Coumadin, CAD, diabetes mellitus type 2 and CKD stage III presented to the emergency department complaining of fatigue and cough. Patient was seen by [place] on 2/13 with pneumonia confirmed by CXR and was initiated on doxycycline treatment. He called several days later to report increasing dyspnea and cough and was advised to present to ED. There had been ongoing concern regarding ? Aspiration pneumonia due to reported problems with swallowing pills.  Upon ED evaluation chest x-ray found right upper lobe pneumonia and he was admitted with working diagnosis of pneumonia for antibiotic therapy and failure of outpatient treatment. Per hospitalist low suspicion of aspiration pneumonia due to location.  Patient was started on broad-spectrum antibiotics with vancomycin and Zosyn.  His respiratory symptoms remarkably improved within a few days, with white cell counts stable.  Cultures were negative. Patient was discharged on oral omnicef. PT evaluated patient for weakness and recommended no follow-up.  He reports he took the prescribed antibiotic for 2 days and stopped due to severe diarrhea; reports is beginning to resolve. He continues to feel well other than some reduced energy from baseline. Denies dyspnea, endorses mild cough but non-productive. He appears well today and speaks in complete sentences, O2 sats 98% on RA. Lungs clear to auscultation today.   Low TSH:  Patient was taking levothyroxine in the past but was ? Not taking  according to hospitalist note -  His TSH was found to be low at last 2 checks, with recommendation for recheck TSH and T4 in 4 weeks. He presents today with synthroid 88 mcg which he reports he has been taking faithfully.       10/01/2016 01/07/2017 04/16/2017 10/29/2017 12/15/2017  TSH 0.350 - 4.500 uIU/mL 1.04 1.62 2.00 0.49 0.25 (A)    12/28/2017  TSH 0.350 - 4.500 uIU/mL 0.182 (A) Performed by a 3rd Generation assay with a functional sensitivity of <=0.01 uIU/mL. Performed at [facility], [place] [address], [place],  [address]     He is followed closely by coumadin clinic for A. Fib treatment; INR was noted to be elevated secondary to antibiotic treatment - was seen for follow up 2/28 with INR 3.1 - has close follow up scheduled for 3/15. Will defer management to them.    Home health is not involved.   Images while in the hospital: Dg Chest 2 View  Result Date: 12/27/2017 CLINICAL DATA:  Pneumonia diagnosed 12 days ago, continued cough and fatigue, follow-up, history coronary artery disease, hypertension, atrial fibrillation EXAM: CHEST  2 VIEW COMPARISON:  12/15/2017 FINDINGS: Normal heart size, mediastinal contours, and pulmonary vascularity. Atherosclerotic calcifications aorta. Chronic bronchitic changes. Persistent lingular infiltrate consistent with pneumonia, slightly increased. Minimal atelectasis at RIGHT base. Probable small focus of infiltrate in the RIGHT upper lobe. No pleural effusion or pneumothorax. Bones demineralized. IMPRESSION: Bronchitic changes with persistent lingular and suspect subtle new RIGHT upper lobe infiltrates consistent with pneumonia. Electronically Signed   By: Lavonia Dana M.D.   On: 12/27/2017 11:59    Past Medical History:  Diagnosis Date  . Atrial fibrillation ([place])   . CORONARY ARTERY DISEASE   . HYPERTENSION   . Leg pain    a. ABI (05/2014):  ABIs falsely elevated due to calcification; TBIs ok; dopplers without significant  stenosis  . MURMUR   . NEPHROLITHIASIS, HX OF   . SINUS BRADYCARDIA   . VITAMIN B12 DEFICIENCY      No Known Allergies    Current Outpatient Medications on File Prior to Visit  Medication Sig Dispense Refill  . amiodarone (PACERONE) 200 MG tablet Take 0.5 tablets (100 mg total) by mouth daily. 90 tablet 0  . aspirin 81 MG tablet Take 81 mg by mouth daily.      Adam Kitchen atorvastatin (LIPITOR) 40 MG tablet Take 1 tablet (40 mg total) by mouth daily. 90 tablet 1  . Cholecalciferol (VITAMIN D-3) 1000 UNITS CAPS Take 4,000 Units by mouth daily.    . quinapril (ACCUPRIL) 20 MG tablet TAKE ONE TABLET BY MOUTH DAILY 90 tablet 1  . warfarin (COUMADIN) 2.5 MG tablet Take as directed by the coumadin clinic. Please do not take coumadin today or tomorrow. Check INR at your coumadin clinic tomorrow .Continue holding it until the INR falls below 3. 90 tablet 0  . blood glucose meter kit and supplies Test blood sugars once daily Dx: E11.9 (Patient not taking: Reported on 01/03/2018) 1 each 0  . cefdinir (OMNICEF) 300 MG capsule Take 1 capsule (300 mg total) by mouth 2 (two) times daily. (Patient not taking: Reported on 01/03/2018) 10 capsule 0  . doxazosin (CARDURA) 4 MG tablet Take 1 tablet at bedtime for prostate (Patient not taking: Reported on 01/03/2018) 90 tablet 1  . guaiFENesin (MUCINEX) 600 MG 12 hr tablet Take 1 tablet (600 mg  total) by mouth 2 (two) times daily. (Patient not taking: Reported on 01/03/2018) 10 tablet 0   No current facility-administered medications on file prior to visit.     ROS: all negative except above.   Physical Exam: Filed Weights   01/03/18 1123  Weight: 155 lb (70.3 kg)   BP 124/66   Pulse 75   Temp (!) 97.3 F (36.3 C)   Resp 18   Ht 5' 7.5" (1.715 m)   Wt 155 lb (70.3 kg)   SpO2 98%   BMI 23.92 kg/m  General Appearance: Well nourished, elderly gentleman in no apparent distress. Eyes: PERRLA, EOMs, conjunctiva no swelling or erythema Sinuses: No Frontal/maxillary  tenderness ENT/Mouth: Ext aud canals clear, TMs without erythema, bulging. No erythema, swelling, or exudate on post pharynx.  Tonsils not swollen or erythematous. Hearing normal.  Neck: Supple, thyroid normal.  Respiratory: Respiratory effort normal, BS equal bilaterally without rales, rhonchi, wheezing or stridor.  Cardio: RRR with 3/6 systolic murmur without radiation. Brisk peripheral pulses without edema.  Abdomen: Soft, + BS.  Non tender, no guarding, rebound, hernias, masses. Lymphatics: Non tender without lymphadenopathy.  Musculoskeletal: Full ROM, 5/5 strength, normal slow gait.  Skin: Warm, dry without rashes, lesions, ecchymosis.  Neuro: Cranial nerves intact. Normal muscle tone, no cerebellar symptoms. Sensation intact.  Psych: Awake and oriented X 3, normal affect, Insight and Judgment appropriate.     Adam Ribas, NP 11:49 AM [facility]

## 2018-01-03 ENCOUNTER — Ambulatory Visit: Payer: Medicare Other | Admitting: Adult Health

## 2018-01-03 ENCOUNTER — Encounter: Payer: Self-pay | Admitting: Adult Health

## 2018-01-03 VITALS — BP 124/66 | HR 75 | Temp 97.3°F | Resp 18 | Ht 67.5 in | Wt 155.0 lb

## 2018-01-03 DIAGNOSIS — N183 Chronic kidney disease, stage 3 unspecified: Secondary | ICD-10-CM

## 2018-01-03 DIAGNOSIS — J181 Lobar pneumonia, unspecified organism: Secondary | ICD-10-CM | POA: Diagnosis not present

## 2018-01-03 DIAGNOSIS — I10 Essential (primary) hypertension: Secondary | ICD-10-CM | POA: Diagnosis not present

## 2018-01-03 DIAGNOSIS — I482 Chronic atrial fibrillation, unspecified: Secondary | ICD-10-CM

## 2018-01-03 DIAGNOSIS — J189 Pneumonia, unspecified organism: Secondary | ICD-10-CM

## 2018-01-03 DIAGNOSIS — E039 Hypothyroidism, unspecified: Secondary | ICD-10-CM | POA: Diagnosis not present

## 2018-01-03 LAB — BASIC METABOLIC PANEL WITH GFR
BUN/Creatinine Ratio: 16 (calc) (ref 6–22)
BUN: 32 mg/dL — ABNORMAL HIGH (ref 7–25)
CALCIUM: 9.4 mg/dL (ref 8.6–10.3)
CO2: 26 mmol/L (ref 20–32)
Chloride: 106 mmol/L (ref 98–110)
Creat: 2.03 mg/dL — ABNORMAL HIGH (ref 0.70–1.11)
GFR, EST AFRICAN AMERICAN: 32 mL/min/{1.73_m2} — AB (ref >=60)
GFR, EST NON AFRICAN AMERICAN: 28 mL/min/{1.73_m2} — AB (ref >=60)
Glucose, Bld: 179 mg/dL — ABNORMAL HIGH (ref 65–99)
POTASSIUM: 4.5 mmol/L (ref 3.5–5.3)
SODIUM: 139 mmol/L (ref 135–146)

## 2018-01-03 LAB — CBC WITH DIFFERENTIAL/PLATELET
BASOS ABS: 184 {cells}/uL (ref 0–200)
Basophils Relative: 1.7 %
EOS ABS: 205 {cells}/uL (ref 15–500)
Eosinophils Relative: 1.9 %
HCT: 31.6 % — ABNORMAL LOW (ref 38.5–50.0)
Hemoglobin: 10.6 g/dL — ABNORMAL LOW (ref 13.2–17.1)
Lymphs Abs: 2171 cells/uL (ref 850–3900)
MCH: 31.9 pg (ref 27.0–33.0)
MCHC: 33.5 g/dL (ref 32.0–36.0)
MCV: 95.2 fL (ref 80.0–100.0)
MONOS PCT: 9.2 %
MPV: 9.2 fL (ref 7.5–12.5)
NEUTROS ABS: 7247 {cells}/uL (ref 1500–7800)
Neutrophils Relative %: 67.1 %
PLATELETS: 370 10*3/uL (ref 140–400)
RBC: 3.32 10*6/uL — ABNORMAL LOW (ref 4.20–5.80)
RDW: 12.7 % (ref 11.0–15.0)
TOTAL LYMPHOCYTE: 20.1 %
WBC mixed population: 994 cells/uL — ABNORMAL HIGH (ref 200–950)
WBC: 10.8 10*3/uL (ref 3.8–10.8)

## 2018-01-03 LAB — TSH: TSH: 1.38 m[IU]/L (ref 0.40–4.50)

## 2018-01-03 MED ORDER — AMIODARONE HCL 200 MG PO TABS: 100.0000 mg | ORAL_TABLET | Freq: Every day | ORAL | 0 refills | Status: DC

## 2018-01-03 MED ORDER — LEVOTHYROXINE SODIUM 88 MCG PO TABS: 88.0000 ug | ORAL_TABLET | Freq: Every day | ORAL | 1 refills | Status: AC

## 2018-01-03 NOTE — Patient Instructions (Signed)
Please make sure you take your levothyroxine 30-60 min before any other medications or food/coffee in the morning.     Community-Acquired Pneumonia, Adult Pneumonia is an infection of the lungs. One type of pneumonia can happen while a person is in a hospital. A different type can happen when a person is not in a hospital (community-acquired pneumonia). It is easy for this kind to spread from person to person. It can spread to you if you breathe near an infected person who coughs or sneezes. Some symptoms include:  A dry cough.  A wet (productive) cough.  Fever.  Sweating.  Chest pain.  Follow these instructions at home:  Take over-the-counter and prescription medicines only as told by your doctor. ? Only take cough medicine if you are losing sleep. ? If you were prescribed an antibiotic medicine, take it as told by your doctor. Do not stop taking the antibiotic even if you start to feel better.  Sleep with your head and neck raised (elevated). You can do this by putting a few pillows under your head, or you can sleep in a recliner.  Do not use tobacco products. These include cigarettes, chewing tobacco, and e-cigarettes. If you need help quitting, ask your doctor.  Drink enough water to keep your pee (urine) clear or pale yellow. A shot (vaccine) can help prevent pneumonia. Shots are often suggested for:  People older than 82 years of age.  People older than 82 years of age: ? Who are having cancer treatment. ? Who have long-term (chronic) lung disease. ? Who have problems with their body's defense system (immune system).  You may also prevent pneumonia if you take these actions:  Get the flu (influenza) shot every year.  Go to the dentist as often as told.  Wash your hands often. If soap and water are not available, use hand sanitizer.  Contact a doctor if:  You have a fever.  You lose sleep because your cough medicine does not help. Get help right away if:  You  are short of breath and it gets worse.  You have more chest pain.  Your sickness gets worse. This is very serious if: ? You are an older adult. ? Your body's defense system is weak.  You cough up blood. This information is not intended to replace advice given to you by your health care provider. Make sure you discuss any questions you have with your health care provider. Document Released: 04/06/2008 Document Revised: 03/26/2016 Document Reviewed: 02/13/2015 Elsevier Interactive Patient Education  [name]2018 [employer]

## 2018-01-14 ENCOUNTER — Other Ambulatory Visit: Payer: Self-pay | Admitting: Internal Medicine

## 2018-01-14 DIAGNOSIS — B3742 Candidal balanitis: Secondary | ICD-10-CM

## 2018-01-14 MED ORDER — FLUCONAZOLE 150 MG PO TABS
ORAL_TABLET | ORAL | 0 refills | Status: DC
Start: 2018-01-14 — End: 2018-02-08

## 2018-01-18 ENCOUNTER — Other Ambulatory Visit: Payer: Self-pay

## 2018-01-18 DIAGNOSIS — Z1211 Encounter for screening for malignant neoplasm of colon: Secondary | ICD-10-CM

## 2018-01-18 DIAGNOSIS — Z1212 Encounter for screening for malignant neoplasm of rectum: Secondary | ICD-10-CM | POA: Diagnosis not present

## 2018-01-18 LAB — POC HEMOCCULT BLD/STL (HOME/3-CARD/SCREEN)
Card #2 Fecal Occult Blod, POC: NEGATIVE
FECAL OCCULT BLD: NEGATIVE
Fecal Occult Blood, POC: NEGATIVE

## 2018-01-20 ENCOUNTER — Encounter: Payer: Self-pay | Admitting: Cardiovascular Disease

## 2018-01-24 ENCOUNTER — Ambulatory Visit (INDEPENDENT_AMBULATORY_CARE_PROVIDER_SITE_OTHER): Payer: Medicare Other | Admitting: *Deleted

## 2018-01-24 DIAGNOSIS — I4891 Unspecified atrial fibrillation: Secondary | ICD-10-CM

## 2018-01-24 DIAGNOSIS — Z5181 Encounter for therapeutic drug level monitoring: Secondary | ICD-10-CM

## 2018-01-24 LAB — POCT INR: INR: 2.2

## 2018-01-24 NOTE — Patient Instructions (Signed)
Description   Continue on same dosage 1 tablet daily except 1/2 tablet on Mondays. Recheck INR in 4 weeks.

## 2018-01-25 NOTE — Progress Notes (Signed)
Patient ID: Adam [name], male   DOB: 02-Jul-1923, 84 y.o.   MRN: [id number]   Cardiology Office Note    Date:  01/31/2018   ID:  Adam [name], DOB 06/01/1923, MRN [id number]  PCP:  Unk Pinto, MD  Cardiologist:  Dr. Jenkins Rouge      History of Present Illness:  84 y.o. CAD distant PCI RCA in 1995. Last cath 2000 with 90% PDA Rx medically PAF failed sotalol and has been on amiodarone since 2001. TSH suppressed on labs 12/28/17 but normalized  01/03/18  Maintained on coumadin due to renal failure Leg pain with normal ABI's 2015. CRF with baseline Cr around 2.0.    Echo reviewed 02/04/17  EF 38-33% grade 2 diastolic Mild  AS/AR/MR  Moderate LAE  Mean AV gradient 17 mmHg peak 35 mmHg   Hospitalized 12/29/17 Pneumonia Rx doxycycline as outpatient but low sats CXR RUL pneumonia ? Aspiration Improved with Zosyn and vancomycin Synthroid stopped due to suppressed TSH  Echo from today 01/31/18 reviewed and stable with mean gradient in 20 mmHg range EF normal   Some frustration with age and weakness with arthritis in knees      Recent Labs: 10/29/2017: HDL 53; LDL Cholesterol (Calc) 86 12/28/2017: ALT 15 01/03/2018: Creat 2.03; Hemoglobin 10.6; Potassium 4.5; TSH 1.38  Wt Readings from Last 3 Encounters:  01/31/18 158 lb 3.2 oz (71.8 kg)  01/03/18 155 lb (70.3 kg)  12/28/17 155 lb 6.8 oz (70.5 kg)     Past Medical History:  Diagnosis Date  . Atrial fibrillation ([place])   . CORONARY ARTERY DISEASE   . HYPERTENSION   . Leg pain    a. ABI (05/2014):  ABIs falsely elevated due to calcification; TBIs ok; dopplers without significant stenosis  . MURMUR   . NEPHROLITHIASIS, HX OF   . SINUS BRADYCARDIA   . VITAMIN B12 DEFICIENCY     Current Outpatient Medications  Medication Sig Dispense Refill  . aspirin 81 MG tablet Take 81 mg by mouth daily.      Marland Kitchen atorvastatin (LIPITOR) 40 MG tablet Take 1 tablet (40 mg total) by mouth daily. 90 tablet 1  . blood glucose meter kit and  supplies Test blood sugars once daily Dx: E11.9 1 each 0  . Cholecalciferol (VITAMIN D-3) 1000 UNITS CAPS Take 4,000 Units by mouth daily.    . fluconazole (DIFLUCAN) 150 MG tablet Take 1 tablet now & repeat in 1 week if needed for yeast infection 2 tablet 0  . guaiFENesin (MUCINEX) 600 MG 12 hr tablet Take 1 tablet (600 mg total) by mouth 2 (two) times daily. 10 tablet 0  . levothyroxine (SYNTHROID, LEVOTHROID) 88 MCG tablet Take 1 tablet (88 mcg total) by mouth daily before breakfast. Take 30-60 min before any other medications or food in the morning. 90 tablet 1  . quinapril (ACCUPRIL) 20 MG tablet Take 20 mg by mouth daily.    Marland Kitchen warfarin (COUMADIN) 2.5 MG tablet Take as directed by the coumadin clinic. Please do not take coumadin today or tomorrow. Check INR at your coumadin clinic tomorrow .Continue holding it until the INR falls below 3. 90 tablet 0  . doxazosin (CARDURA) 4 MG tablet Take 1 tablet at bedtime for prostate (Patient not taking: Reported on 01/03/2018) 90 tablet 1   No current facility-administered medications for this visit.     Allergies:   Patient has no known allergies.   Social History:  The patient  reports that he has never  smoked. He has never used smokeless tobacco. He reports that he does not drink alcohol or use drugs.   Family History:  The patient's family history includes Colon polyps in his unknown relative; Heart disease in his unknown relative.   ROS:  Please see the history of present illness.      All other systems reviewed and negative.   PHYSICAL EXAM: VS:  BP 130/62   Pulse 67   Ht 5' 7.5" (1.715 m)   Wt 158 lb 3.2 oz (71.8 kg)   SpO2 96%   BMI 24.41 kg/m  Affect appropriate Healthy:  appears stated age 45: normal Neck supple with no adenopathy JVP normal no bruits no thyromegaly Lungs clear with no wheezing and good diaphragmatic motion Heart:  S1/S2 AS/AR  murmur, no rub, gallop or click PMI normal Abdomen: benighn, BS positve, no  tenderness, no AAA no bruit.  No HSM or HJR Distal pulses intact with no bruits No edema Neuro non-focal Skin warm and dry No muscular weakness    EKG:  05/28/15   Sinus bradycardia, HR 59, LAD, first degree AV block (PR 214 ms), nonspecific ST-T wave changes, no significant change when compared to prior tracing, QTc 431     ASSESSMENT AND PLAN:  1. Pain of lower extremity, . ABI's normal .doubt claudication  2. CAD :  Continue aspirin, statin. No angina. 3. Paroxysmal atrial fibrillation:  Maintaining NSR. Continue Coumadin, amiodarone.  TSH .182 12/28/17 Normal LFTls       Will stop amiodarone given age and thyroid issues  4. Hypertension:  Well controlled.  Continue current medications and low sodium Dash type diet.   5. Hyperlipidemia:  Recent LDL optimal. Continue statin.  6. Murmur:   stabel AS and normal EF mean gradient 20 mmHg peak 35 mmHg echo 01/31/18 7.         DM Discussed low carb diet.  Target hemoglobin A1c is 6.5 or less.  Continue current medications.  Lab Results  Component Value Date   HGBA1C 6.8 (H) 10/29/2017   8. Pneumonia with recent hospitalization 12/29/17 Needs f/u CXR  With primary    Stop Amiodarone   Jenkins Rouge

## 2018-01-26 ENCOUNTER — Other Ambulatory Visit: Payer: Self-pay | Admitting: Internal Medicine

## 2018-01-31 ENCOUNTER — Other Ambulatory Visit: Payer: Self-pay

## 2018-01-31 ENCOUNTER — Ambulatory Visit (HOSPITAL_COMMUNITY): Payer: Medicare Other | Attending: Internal Medicine

## 2018-01-31 ENCOUNTER — Encounter: Payer: Self-pay | Admitting: Cardiovascular Disease

## 2018-01-31 ENCOUNTER — Ambulatory Visit: Payer: Medicare Other | Admitting: Cardiovascular Disease

## 2018-01-31 VITALS — BP 130/62 | HR 67 | Ht 67.5 in | Wt 158.2 lb

## 2018-01-31 DIAGNOSIS — I082 Rheumatic disorders of both aortic and tricuspid valves: Secondary | ICD-10-CM | POA: Diagnosis not present

## 2018-01-31 DIAGNOSIS — R001 Bradycardia, unspecified: Secondary | ICD-10-CM | POA: Insufficient documentation

## 2018-01-31 DIAGNOSIS — I2510 Atherosclerotic heart disease of native coronary artery without angina pectoris: Secondary | ICD-10-CM | POA: Insufficient documentation

## 2018-01-31 DIAGNOSIS — R011 Cardiac murmur, unspecified: Secondary | ICD-10-CM | POA: Diagnosis not present

## 2018-01-31 DIAGNOSIS — I350 Nonrheumatic aortic (valve) stenosis: Secondary | ICD-10-CM | POA: Diagnosis not present

## 2018-01-31 DIAGNOSIS — I340 Nonrheumatic mitral (valve) insufficiency: Secondary | ICD-10-CM

## 2018-01-31 DIAGNOSIS — I4891 Unspecified atrial fibrillation: Secondary | ICD-10-CM | POA: Insufficient documentation

## 2018-01-31 NOTE — Patient Instructions (Addendum)
Medication Instructions:  Your physician has recommended you make the following change in your medication:  1-STOP amiodarone  Labwork: NONE  Testing/Procedures: NONE  Follow-Up: Your physician wants you to follow-up in: 6 months with Dr. Nishan. You will receive a reminder letter in the mail two months in advance. If you don't receive a letter, please call our office to schedule the follow-up appointment.   If you need a refill on your cardiac medications before your next appointment, please call your pharmacy.    

## 2018-02-07 NOTE — Progress Notes (Signed)
MEDICARE ANNUAL WELLNESS VISIT AND FOLLOW UP Assessment:   Diagnoses and all orders for this visit:  Encounter for Medicare annual wellness exam  Essential hypertension At goal; - however stopping quinapril today for acute decline in renal function, NV for follow up /recheck of BP and labs in 2 weeks, will restart alternate agent if needed Monitor blood pressure at home; call if consistently over 130/80 Continue DASH diet.   Reminder to go to the ER if any CP, SOB, nausea, dizziness, severe HA, changes vision/speech, left arm numbness and tingling and jaw pain.  Atherosclerosis of native coronary artery of native heart without angina pectoris Control blood pressure, cholesterol, glucose, increase exercise.   Chronic atrial fibrillation (HCC) Rate controlled today Continue coumadin - follow up with coumadin clinic Reminded to notify of any abx, concerning bleeding, go to ER for any head injury  Community acquired pneumonia of right upper lobe of lung (HCC) Symptoms resolved; obtain CXR today for verification of resolution  Type 2 diabetes mellitus with stage 4 chronic kidney disease, without long-term current use of insulin [facility](HCC) Education: Reviewed 'ABCs' of diabetes management (respective goals in parentheses):  A1C (<7), blood pressure (<130/80), and cholesterol (LDL <70) Eye Exam yearly and Dental Exam every 6 months- verified eye exam and abstracted  Just had foot exam at CPE - defer Dietary recommendations Physical Activity recommendations  Hypothyroidism, unspecified type ? Need for continued treatment Has continued with treatment by synthroid continue medications the same pending lab results reminded to take on an empty stomach 30-4660mins before food.  check TSH level, check T4 only if further abnormalities  CKD stage 4 due to type 2 diabetes mellitus (HCC) Increase fluids, avoid NSAIDS, monitor sugars, will monitor, stopping ACEi today Has seen nephrology remotely;  refer back today for acute decline in GFR  Bladder neck obstruction Wakes up once at night to urinate, has slow start of stream but otherwise no problems Refer to urology as needed  Warfarin-induced coagulopathy (HCC) Followed by a. Fib clinic  Vitamin D deficiency At goal at recent check; continue to recommend supplementation for goal of 70-100 Defer vitamin D level  NEPHROLITHIASIS, HX OF Drink plenty of fluids, avoid tea No recent problems; follow up urology as needed  Medication management CBC, BMP, LFT  Hyperlipidemia Continue medications: atorvastatin Continue low cholesterol diet and exercise.  Check lipid panel.   Dysphasia Take small bites, avoiding eating while distracted, drink fluids with difficult foods  Hard of hearing (HOH) Hearing aids not sufficient for adequate hearing, is [facility] He is interested in cochlear implant; [facility] up to discuss  Over 30 minutes of exam, counseling, chart review, and critical decision making was performed  Future Appointments  Date Time Provider Department Center  02/22/2018 11:45 AM [facility]  05/31/2018 10:30 AM [name], [name], MD [facility] None  11/24/2018 10:00 AM [name], [name], MD [facility] None     Plan:   During the course of the visit the patient was educated and counseled about appropriate screening and preventive services including:    Pneumococcal vaccine   Influenza vaccine  Prevnar 13  Td vaccine  Screening electrocardiogram  Colorectal cancer screening  Diabetes screening  Glaucoma screening  Nutrition counseling    Subjective:  Adam [name] is a 84 y.o. male who presents for Medicare Annual Wellness Visit and 3 month follow up for HTN, hyperlipidemia, T2DM, and vitamin D Def. In 1990 and 2000,  he had PCA/Stents x 2 and has been on Coumadin since 2000 for cAfib followed at [facility] clinics. He was also recently admitted for CAP and is due today for follow up CXR; while hospitalized, TSHs were noted to be low and he was discharged with recommendation to stop levothyroxine, but presented to follow up on 3/4 having continued with typical 88 mcg daily dose, and despite this TSH that day was at goal at 1.38- recommended he continue with medication and will recheck today.   BMI is Body mass index is 24.54 kg/m., he has been working on diet and exercise. Wt Readings from Last 3 Encounters:  02/08/18 159 lb (72.1 kg)  01/31/18 158 lb 3.2 oz (71.8 kg)  01/03/18 155 lb (70.3 kg)   His blood pressure has been controlled at home, today their BP is BP: (!) 126/58 He does workout. He denies chest pain, shortness of breath, dizziness.   He is on cholesterol medication (atorvastatin 40 mg daily) and denies myalgias. His cholesterol is not at goal of LDL <70. The cholesterol last visit was:   Lab Results  Component Value Date   CHOL 158 10/29/2017   HDL 53 10/29/2017   LDLCALC 86 10/29/2017   TRIG 94 10/29/2017   CHOLHDL 3.0 10/29/2017   He has been working on diet and exercise for T2DM, and denies foot ulcerations, increased appetite, nausea, paresthesia of the feet, polydipsia, polyuria, visual disturbances, vomiting and weight loss. Last A1C in the office was:  Lab Results  Component Value Date   HGBA1C 6.8 (H) 10/29/2017   Last GFR Lab Results  Component Value Date   GFRNONAA 28 (L) 01/03/2018   He is on thyroid medication. His medication was not changed last visit.   Lab Results  Component Value Date   TSH 1.38 01/03/2018    Patient is on Vitamin D supplement and at goal at recent check:    Lab Results  Component Value Date   VD25OH 69 10/29/2017      Medication Review: Current Outpatient Medications on File Prior to Visit  Medication Sig Dispense Refill  . aspirin 81 MG tablet Take 81 mg by mouth daily.      Marland Kitchen atorvastatin (LIPITOR) 40 MG tablet Take 1  tablet (40 mg total) by mouth daily. 90 tablet 1  . Cholecalciferol (VITAMIN D-3) 1000 UNITS CAPS Take 4,000 Units by mouth daily.    Marland Kitchen levothyroxine (SYNTHROID, LEVOTHROID) 88 MCG tablet Take 1 tablet (88 mcg total) by mouth daily before breakfast. Take 30-60 min before any other medications or food in the morning. 90 tablet 1  . quinapril (ACCUPRIL) 20 MG tablet Take 20 mg by mouth daily.    Marland Kitchen warfarin (COUMADIN) 2.5 MG tablet Take as directed by the coumadin clinic. Please do not take coumadin today or tomorrow. Check INR at your coumadin clinic tomorrow .Continue holding it until the INR falls below 3. 90 tablet 0   No current facility-administered medications on file prior to visit.     Allergies: No Known Allergies  Current Problems (verified) has Essential hypertension; Coronary atherosclerosis; NEPHROLITHIASIS, HX OF; Atrial fibrillation (HCC); Type 2 diabetes mellitus (HCC); Hyperlipidemia; Vitamin D deficiency; Medication management; CKD stage 4 due to type 2 diabetes mellitus (HCC); GERD (gastroesophageal reflux disease); Hypothyroidism; Encounter for Medicare annual wellness exam; Warfarin-induced coagulopathy (HCC); Bladder neck obstruction; CAP (community acquired pneumonia); and Dysphasia on their problem list.  Screening Tests Immunization History  Administered Date(s) Administered  . DT 12/03/2015  .  Influenza Split 07/06/2013  . Influenza, High Dose Seasonal PF 08/09/2014, 07/25/2015, 08/20/2016, 07/22/2017  . Pneumococcal Polysaccharide-23 01/18/2008  . Td 11/02/2004  . Zoster 10/07/2009   Preventative care: Last colonoscopy: 2007, no further secondary to age  Prior vaccinations: TD or Tdap: 2017  Influenza: 2018  Pneumococcal: 2009 Prevnar13: 2019 Shingles/Zostavax: 2010  Names of Other Physician/Practitioners you currently use: 1. [facility] here for primary care 2. Dr. Elmer Picker, eye doctor, last visit 06/18/2017 - report  confirmed in system and abstracted 3. Dr.Benson , dentist, last visit 2018  Patient Care Team: Lucky Cowboy, MD as PCP - General (Internal Medicine) Wendall Stade, MD as Consulting Physician (Cardiology) Mateo Flow, MD as Consulting Physician (Ophthalmology)  Surgical: He  has a past surgical history that includes Angioplasty. Family His family history includes Colon polyps in his unknown relative; Heart disease in his unknown relative. Social history  He reports that he has never smoked. He has never used smokeless tobacco. He reports that he does not drink alcohol or use drugs.  MEDICARE WELLNESS OBJECTIVES: Physical activity: Current Exercise Habits: Home exercise routine, Type of exercise: Other - see comments;walking(Golfing), Time (Minutes): 60, Frequency (Times/Week): 5, Weekly Exercise (Minutes/Week): 300, Intensity: Mild, Exercise limited by: None identified Cardiac risk factors: Cardiac Risk Factors include: advanced age (>31men, >42 women);dyslipidemia;male gender;hypertension Depression/mood screen:   Depression screen [facility] 2/9 02/08/2018  Decreased Interest 0  Down, Depressed, Hopeless 0  PHQ - 2 Score 0  Some recent data might be hidden    ADLs:  In your present state of health, do you have any difficulty performing the following activities: 02/08/2018 12/28/2017  Hearing? [name]  Comment Wears bilateral hearing aids, not hearing well even with hearing aids in  wears 2 hearing aides  Vision? N N  Difficulty concentrating or making decisions? N N  Walking or climbing stairs? N Y  Dressing or bathing? N N  Doing errands, shopping? N Y  Some recent data might be hidden     Cognitive Testing  Alert? Yes  Normal Appearance?Yes  Oriented to person? Yes  Place? Yes   Time? Yes  Recall of three objects?  2/3  Can perform simple calculations? Yes  Displays appropriate judgment?Yes  Can read the correct time from a watch face?Yes  EOL planning: Does Patient Have a  Medical Advance Directive?: Yes Type of Advance Directive: Healthcare Power of Attorney, Living will Does patient want to make changes to medical advance directive?: No - Patient declined Copy of Healthcare Power of Attorney in Chart?: Yes   Objective:   Today's Vitals   02/08/18 1555  BP: (!) 126/58  Pulse: 64  Temp: (!) 97.3 F (36.3 C)  SpO2: 97%  Weight: 159 lb (72.1 kg)  Height: 5' 7.5" (1.715 m)   Body mass index is 24.54 kg/m.  General appearance: alert, no distress, WD/WN, male HEENT: normocephalic, sclerae anicteric, TMs pearly, nares patent, no discharge or erythema, pharynx normal, very HOH even with bilateral hearing aids Oral cavity: MMM, no lesions Neck: supple, no lymphadenopathy, no thyromegaly, no masses Heart: RRR, normal S1, S2, no murmurs Lungs: CTA bilaterally, no wheezes, rhonchi, or rales Abdomen: +bs, soft, non tender, non distended, no masses, no hepatomegaly, no splenomegaly Musculoskeletal: nontender, no swelling, no obvious deformity Extremities: no edema, no cyanosis, no clubbing Pulses: 2+ symmetric, upper and lower extremities, normal cap refill Neurological: alert, oriented x 3, CN2-12 intact, strength normal upper extremities and lower extremities, sensation normal throughout,  DTRs 2+ throughout, no cerebellar signs, gait normal Psychiatric: normal affect, behavior normal, pleasant   Medicare Attestation I have personally reviewed: The patient's medical and social history Their use of alcohol, tobacco or illicit drugs Their current medications and supplements The patient's functional ability including ADLs,fall risks, home safety risks, cognitive, and hearing and visual impairment Diet and physical activities Evidence for depression or mood disorders  The patient's weight, height, BMI, and visual acuity have been recorded in the chart.  I have made referrals, counseling, and provided education to the patient based on review of the above and  I have provided the patient with a written personalized care plan for preventive services.     Dan Maker, NP   02/08/2018

## 2018-02-08 ENCOUNTER — Ambulatory Visit: Payer: Self-pay | Admitting: Adult Health

## 2018-02-08 ENCOUNTER — Encounter: Payer: Self-pay | Admitting: Adult Health

## 2018-02-08 ENCOUNTER — Ambulatory Visit: Payer: Medicare Other | Admitting: Adult Health

## 2018-02-08 VITALS — BP 126/58 | HR 64 | Temp 97.3°F | Ht 67.5 in | Wt 159.0 lb

## 2018-02-08 DIAGNOSIS — J181 Lobar pneumonia, unspecified organism: Secondary | ICD-10-CM | POA: Diagnosis not present

## 2018-02-08 DIAGNOSIS — Z87442 Personal history of urinary calculi: Secondary | ICD-10-CM

## 2018-02-08 DIAGNOSIS — I2510 Atherosclerotic heart disease of native coronary artery without angina pectoris: Secondary | ICD-10-CM

## 2018-02-08 DIAGNOSIS — D649 Anemia, unspecified: Secondary | ICD-10-CM

## 2018-02-08 DIAGNOSIS — E782 Mixed hyperlipidemia: Secondary | ICD-10-CM

## 2018-02-08 DIAGNOSIS — R4702 Dysphasia: Secondary | ICD-10-CM

## 2018-02-08 DIAGNOSIS — I482 Chronic atrial fibrillation, unspecified: Secondary | ICD-10-CM

## 2018-02-08 DIAGNOSIS — D6832 Hemorrhagic disorder due to extrinsic circulating anticoagulants: Secondary | ICD-10-CM | POA: Diagnosis not present

## 2018-02-08 DIAGNOSIS — Z0001 Encounter for general adult medical examination with abnormal findings: Secondary | ICD-10-CM

## 2018-02-08 DIAGNOSIS — K219 Gastro-esophageal reflux disease without esophagitis: Secondary | ICD-10-CM

## 2018-02-08 DIAGNOSIS — Z79899 Other long term (current) drug therapy: Secondary | ICD-10-CM

## 2018-02-08 DIAGNOSIS — N320 Bladder-neck obstruction: Secondary | ICD-10-CM

## 2018-02-08 DIAGNOSIS — I10 Essential (primary) hypertension: Secondary | ICD-10-CM | POA: Diagnosis not present

## 2018-02-08 DIAGNOSIS — N184 Chronic kidney disease, stage 4 (severe): Secondary | ICD-10-CM

## 2018-02-08 DIAGNOSIS — E1122 Type 2 diabetes mellitus with diabetic chronic kidney disease: Secondary | ICD-10-CM

## 2018-02-08 DIAGNOSIS — Z23 Encounter for immunization: Secondary | ICD-10-CM | POA: Diagnosis not present

## 2018-02-08 DIAGNOSIS — R6889 Other general symptoms and signs: Secondary | ICD-10-CM

## 2018-02-08 DIAGNOSIS — J189 Pneumonia, unspecified organism: Secondary | ICD-10-CM

## 2018-02-08 DIAGNOSIS — Z0000 Encounter for general adult medical examination without abnormal findings: Secondary | ICD-10-CM

## 2018-02-08 DIAGNOSIS — E559 Vitamin D deficiency, unspecified: Secondary | ICD-10-CM

## 2018-02-08 DIAGNOSIS — E039 Hypothyroidism, unspecified: Secondary | ICD-10-CM | POA: Diagnosis not present

## 2018-02-08 DIAGNOSIS — T45515A Adverse effect of anticoagulants, initial encounter: Secondary | ICD-10-CM

## 2018-02-08 DIAGNOSIS — H9193 Unspecified hearing loss, bilateral: Secondary | ICD-10-CM

## 2018-02-08 NOTE — Patient Instructions (Signed)
[facility] with [facility] or ENT doctor to discuss your hearing and possible benefit of cochlear implant or other options.   Hearing Loss Hearing loss is a partial or total loss of the ability to hear. This can be temporary or permanent, and it can happen in one or both ears. Hearing loss may be referred to as deafness. Medical care is necessary to treat hearing loss properly and to prevent the condition from getting worse. Your hearing may partially or completely come back, depending on what caused your hearing loss and how severe it is. In some cases, hearing loss is permanent. What are the causes? Common causes of hearing loss include:  Too much wax in the ear canal.  Infection of the ear canal or middle ear.  Fluid in the middle ear.  Injury to the ear or surrounding area.  An object stuck in the ear.  Prolonged exposure to loud sounds, such as music.  Less common causes of hearing loss include:  Tumors in the ear.  Viral or bacterial infections, such as meningitis.  A hole in the eardrum (perforated eardrum).  Problems with the hearing nerve that sends signals between the brain and the ear.  Certain medicines.  What are the signs or symptoms? Symptoms of this condition may include:  Difficulty telling the difference between sounds.  Difficulty following a conversation when there is background noise.  Lack of response to sounds in your environment. This may be most noticeable when you do not respond to startling sounds.  Needing to turn up the volume on the television, radio, etc.  Ringing in the ears.  Dizziness.  Pain in the ears.  How is this diagnosed? This condition is diagnosed based on a physical exam and a hearing test (audiometry). The audiometry test will be performed by a hearing specialist (audiologist). You may also be referred to an ear, nose, and throat (ENT) specialist (otolaryngologist). How is this  treated? Treatment for recent onset of hearing loss may include:  Ear wax removal.  Being prescribed medicines to prevent infection (antibiotics).  Being prescribed medicines to reduce inflammation (corticosteroids).  Follow these instructions at home:  If you were prescribed an antibiotic medicine, take it as told by your health care provider. Do not stop taking the antibiotic even if you start to feel better.  Take over-the-counter and prescription medicines only as told by your health care provider.  Avoid loud noises.  Return to your normal activities as told by your health care provider. Ask your health care provider what activities are safe for you.  Keep all follow-up visits as told by your health care provider. This is important. Contact a health care provider if:  You feel dizzy.  You develop new symptoms.  You vomit or feel nauseous.  You have a fever. Get help right away if:  You develop sudden changes in your vision.  You have severe ear pain.  You have new or increased weakness.  You have a severe headache. This information is not intended to replace advice given to you by your health care provider. Make sure you discuss any questions you have with your health care provider. Document Released: 10/19/2005 Document Revised: 03/26/2016 Document Reviewed: 03/06/2015 Elsevier Interactive Patient Education  2018 [employer]

## 2018-02-09 ENCOUNTER — Ambulatory Visit (HOSPITAL_COMMUNITY)
Admission: RE | Admit: 2018-02-09 | Discharge: 2018-02-09 | Disposition: A | Discharge status: Home / Self Care | Payer: Medicare Other | Source: Ambulatory Visit | Attending: Adult Health | Admitting: Adult Health

## 2018-02-09 DIAGNOSIS — Z23 Encounter for immunization: Secondary | ICD-10-CM

## 2018-02-09 DIAGNOSIS — Z09 Encounter for follow-up examination after completed treatment for conditions other than malignant neoplasm: Secondary | ICD-10-CM | POA: Insufficient documentation

## 2018-02-09 DIAGNOSIS — J189 Pneumonia, unspecified organism: Secondary | ICD-10-CM | POA: Diagnosis not present

## 2018-02-09 DIAGNOSIS — J181 Lobar pneumonia, unspecified organism: Secondary | ICD-10-CM

## 2018-02-09 LAB — BASIC METABOLIC PANEL WITH GFR
BUN/Creatinine Ratio: 17 (calc) (ref 6–22)
BUN: 45 mg/dL — ABNORMAL HIGH (ref 7–25)
CHLORIDE: 106 mmol/L (ref 98–110)
CO2: 28 mmol/L (ref 20–32)
Calcium: 9.3 mg/dL (ref 8.6–10.3)
Creat: 2.63 mg/dL — ABNORMAL HIGH (ref 0.70–1.11)
GFR, EST AFRICAN AMERICAN: 23 mL/min/{1.73_m2} — AB (ref >=60)
GFR, Est Non African American: 20 mL/min/{1.73_m2} — ABNORMAL LOW (ref >=60)
Glucose, Bld: 156 mg/dL — ABNORMAL HIGH (ref 65–99)
POTASSIUM: 4.7 mmol/L (ref 3.5–5.3)
Sodium: 142 mmol/L (ref 135–146)

## 2018-02-09 LAB — CBC WITH DIFFERENTIAL/PLATELET
Basophils Absolute: 92 cells/uL (ref 0–200)
Basophils Relative: 1.4 %
Eosinophils Absolute: 191 cells/uL (ref 15–500)
Eosinophils Relative: 2.9 %
HCT: 30.3 % — ABNORMAL LOW (ref 38.5–50.0)
Hemoglobin: 10.1 g/dL — ABNORMAL LOW (ref 13.2–17.1)
Lymphs Abs: 2053 cells/uL (ref 850–3900)
MCH: 31.8 pg (ref 27.0–33.0)
MCHC: 33.3 g/dL (ref 32.0–36.0)
MCV: 95.3 fL (ref 80.0–100.0)
MONOS PCT: 11.8 %
MPV: 9.8 fL (ref 7.5–12.5)
NEUTROS PCT: 52.8 %
Neutro Abs: 3485 cells/uL (ref 1500–7800)
PLATELETS: 267 10*3/uL (ref 140–400)
RBC: 3.18 10*6/uL — AB (ref 4.20–5.80)
RDW: 13.2 % (ref 11.0–15.0)
TOTAL LYMPHOCYTE: 31.1 %
WBC: 6.6 10*3/uL (ref 3.8–10.8)
WBCMIX: 779 {cells}/uL (ref 200–950)

## 2018-02-09 LAB — HEPATIC FUNCTION PANEL
AG Ratio: 1.2 (calc) (ref 1.0–2.5)
ALKALINE PHOSPHATASE (APISO): 127 U/L — AB (ref 40–115)
ALT: 12 U/L (ref 9–46)
AST: 17 U/L (ref 10–35)
Albumin: 4 g/dL (ref 3.6–5.1)
BILIRUBIN DIRECT: 0.1 mg/dL (ref 0.0–0.2)
Globulin: 3.3 g/dL (calc) (ref 1.9–3.7)
Indirect Bilirubin: 0.3 mg/dL (calc) (ref 0.2–1.2)
Total Bilirubin: 0.4 mg/dL (ref 0.2–1.2)
Total Protein: 7.3 g/dL (ref 6.1–8.1)

## 2018-02-09 LAB — LIPID PANEL
CHOL/HDL RATIO: 3.5 (calc) (ref <5.0)
CHOLESTEROL: 140 mg/dL (ref <200)
HDL: 40 mg/dL — ABNORMAL LOW (ref >40)
LDL Cholesterol (Calc): 76 mg/dL (calc)
Non-HDL Cholesterol (Calc): 100 mg/dL (calc) (ref <130)
Triglycerides: 141 mg/dL (ref <150)

## 2018-02-09 LAB — HEMOGLOBIN A1C
HEMOGLOBIN A1C: 7.1 %{Hb} — AB (ref <5.7)
Mean Plasma Glucose: 157 (calc)
eAG (mmol/L): 8.7 (calc)

## 2018-02-09 LAB — TSH: TSH: 0.42 mIU/L (ref 0.40–4.50)

## 2018-02-22 ENCOUNTER — Ambulatory Visit (INDEPENDENT_AMBULATORY_CARE_PROVIDER_SITE_OTHER): Payer: Medicare Other | Admitting: *Deleted

## 2018-02-22 DIAGNOSIS — I4891 Unspecified atrial fibrillation: Secondary | ICD-10-CM | POA: Diagnosis not present

## 2018-02-22 DIAGNOSIS — Z5181 Encounter for therapeutic drug level monitoring: Secondary | ICD-10-CM | POA: Diagnosis not present

## 2018-02-22 LAB — POCT INR: INR: 3.2

## 2018-02-22 NOTE — Patient Instructions (Signed)
Description   Do not take coumadin tomorrow April 24th as he has taken coumadin today then continue on same dosage 1 tablet daily except 1/2 tablet on Mondays. Recheck INR in 3 weeks.

## 2018-02-24 ENCOUNTER — Ambulatory Visit (INDEPENDENT_AMBULATORY_CARE_PROVIDER_SITE_OTHER): Payer: Medicare Other | Admitting: Internal Medicine

## 2018-02-24 ENCOUNTER — Ambulatory Visit: Payer: Self-pay | Admitting: Physician Assistant

## 2018-02-24 ENCOUNTER — Ambulatory Visit: Payer: Medicare Other | Admitting: Internal Medicine

## 2018-02-24 ENCOUNTER — Encounter: Payer: Self-pay | Admitting: Internal Medicine

## 2018-02-24 VITALS — BP 144/68 | HR 88 | Temp 97.5°F | Resp 18 | Ht 68.5 in | Wt 153.0 lb

## 2018-02-24 DIAGNOSIS — I482 Chronic atrial fibrillation, unspecified: Secondary | ICD-10-CM

## 2018-02-24 DIAGNOSIS — I10 Essential (primary) hypertension: Secondary | ICD-10-CM

## 2018-02-24 DIAGNOSIS — N184 Chronic kidney disease, stage 4 (severe): Secondary | ICD-10-CM

## 2018-02-24 DIAGNOSIS — Z79899 Other long term (current) drug therapy: Secondary | ICD-10-CM | POA: Diagnosis not present

## 2018-02-24 DIAGNOSIS — E1122 Type 2 diabetes mellitus with diabetic chronic kidney disease: Secondary | ICD-10-CM | POA: Diagnosis not present

## 2018-02-24 NOTE — Progress Notes (Signed)
  Subjective:    Patient ID: Ernestene MentionJames M Dirden, male    DOB: 09/26/1923, 84 y.o.   MRN: [id number]  HPI   Patient is a 84 yo MWM with HTN, ASCAD/cAfib, T2_DM, HLD, Hypothyroidism, Vit D Deficiency with CKD4 who had recent drop in GFR and returns today for repeat labs. Patient endorses chronic DOE and rapid fatigability, but relates still working in the yard and playing 18 holes of golf. His recent labs found A1c was up slightly from 6.8% to 7.1% and BUN was up from 32 -->>45 , Creat up from 2.03 -->> 2.63 and GFR down from 28-->>20 . Patient admits not drinking enough water - only about 2 bottle equivalents / day.       He denies HA, postural dizziness, CP, palpitations, PND/Orthopnea or edema.   Medication Sig  . aspirin 81 MG tablet Take  daily.    Marland Kitchen. atorvastatin  40 MG tablet Take 1 tab  daily.  Marland Kitchen. VITAMIN D  Take 4,000 Units b daily.  Marland Kitchen. levothyroxine  88 MCG tablet Take 1 tab daily before breakfast.  . warfarin  2.5 MG tablet Take as directed by the coumadin clinic. Marland Kitchen.   No Known Allergies   Past Medical History:  Diagnosis Date  . Atrial fibrillation (HCC)   . CORONARY ARTERY DISEASE   . HYPERTENSION   . Leg pain    a. ABI (05/2014):  ABIs falsely elevated due to calcification; TBIs ok; dopplers without significant stenosis  . MURMUR   . NEPHROLITHIASIS, HX OF   . SINUS BRADYCARDIA   . VITAMIN B12 DEFICIENCY    Review of Systems  10 point systems review negative except as above.    Objective:   Physical Exam  BP 144/68 Comment: 144/68-sit/116/56-standing  Pulse 88   Temp  97.5 F    Resp 18   Recheck BP 145/45   P 82  HEENT - WNL. Neck - supple.  Chest - Clear equal BS. Cor - soft HS. Sl irRR w/o sig m. PP 1(+). No edema. MS- FROM w/o deformities.  Gait Nl. Generalized decrease in muscle power, tone & bulk. Neuro -  Nl w/o focal abnormalities.    Assessment & Plan:   1. Essential hypertension   2. Chronic atrial fibrillation (HCC)   3. Type 2 diabetes mellitus  with stage 4 chronic kidney disease, without long-term current use of insulin (HCC)  - BMET   4. Medication management

## 2018-02-24 NOTE — Patient Instructions (Signed)

## 2018-02-24 NOTE — Progress Notes (Signed)
Pt presents for lab & BP check

## 2018-02-25 ENCOUNTER — Encounter: Payer: Self-pay | Admitting: Physician Assistant

## 2018-02-25 ENCOUNTER — Ambulatory Visit (HOSPITAL_COMMUNITY)
Admission: RE | Admit: 2018-02-25 | Discharge: 2018-02-25 | Disposition: A | Discharge status: Home / Self Care | Payer: Medicare Other | Source: Ambulatory Visit | Attending: Physician Assistant | Admitting: Physician Assistant

## 2018-02-25 ENCOUNTER — Ambulatory Visit: Payer: Medicare Other | Admitting: Physician Assistant

## 2018-02-25 VITALS — BP 140/54 | HR 61 | Temp 97.5°F | Resp 12 | Ht 68.5 in | Wt 161.8 lb

## 2018-02-25 DIAGNOSIS — R042 Hemoptysis: Secondary | ICD-10-CM | POA: Diagnosis not present

## 2018-02-25 DIAGNOSIS — J810 Acute pulmonary edema: Secondary | ICD-10-CM | POA: Diagnosis not present

## 2018-02-25 DIAGNOSIS — R531 Weakness: Secondary | ICD-10-CM | POA: Diagnosis not present

## 2018-02-25 DIAGNOSIS — I482 Chronic atrial fibrillation, unspecified: Secondary | ICD-10-CM

## 2018-02-25 DIAGNOSIS — R918 Other nonspecific abnormal finding of lung field: Secondary | ICD-10-CM | POA: Insufficient documentation

## 2018-02-25 DIAGNOSIS — A419 Sepsis, unspecified organism: Secondary | ICD-10-CM | POA: Diagnosis not present

## 2018-02-25 DIAGNOSIS — Z8701 Personal history of pneumonia (recurrent): Secondary | ICD-10-CM | POA: Insufficient documentation

## 2018-02-25 DIAGNOSIS — R05 Cough: Secondary | ICD-10-CM

## 2018-02-25 DIAGNOSIS — R0602 Shortness of breath: Secondary | ICD-10-CM | POA: Insufficient documentation

## 2018-02-25 MED ORDER — DOXYCYCLINE HYCLATE 100 MG PO CAPS: ORAL_CAPSULE | ORAL | 0 refills | Status: AC

## 2018-02-25 MED ORDER — BENZONATATE 100 MG PO CAPS: 100.0000 mg | ORAL_CAPSULE | Freq: Three times a day (TID) | ORAL | 0 refills | Status: AC | PRN

## 2018-02-25 MED ORDER — ALBUTEROL SULFATE HFA 108 (90 BASE) MCG/ACT IN AERS: 1.0000 | INHALATION_SPRAY | Freq: Four times a day (QID) | RESPIRATORY_TRACT | 1 refills | Status: AC | PRN

## 2018-02-25 NOTE — Patient Instructions (Signed)
Sit down when you do your inhaler Do not drive right now Can do steroid inhaler, NEED TO DO DAILY, this is NOT a rescue inhaler so if you are acutely short of breath please use your albuterol or call 911.  Do 1 puff once a day.  Do before you brush your teeth OR wash your mouth afterwards.  IF YOU DO NOT WASH YOUR MOUTH OUT IT CAN CAUSE YEAST Can do 2 tsp vinegar with water and switch to help prevent yeast or help yeast in your mouth.   Go to the ER if any chest pain, shortness of breath, nausea, dizziness, severe HA, changes vision/speech  The albuterol inhaler you can do AS needed if you have shortness of breath or cough  IM GIVEN YOU AN ANTIBIOTIC PLEASE CUT BACK TO 1/2 PILL EVERY DAY WHILE YOU ARE ON THE ANTIBIOTIC YOU CAN GOT BACK TO YOUR NORMAL DOSE WHILE YOU ARE OFF THE ANTIBIOTIC  [facility] go to radiology and give them your name. They will have the order and take you back. You do not any paper work, I should get the result back today or tomorrow. This order is good for a year.   Hemoptysis Hemoptysis is coughing up blood. It can be mild or serious. With mild hemoptysis, you may cough up blood-streaked saliva and mucus (sputum) when you have an infection in your nose, throat, or lungs (respiratory tract). Coughing up 1-2 cups (240-480 mL) of blood within 24 hours (massive hemoptysis) is a medical emergency. The most common cause of hemoptysis is a respiratory tract infection, such as bronchitis or pneumonia. Other common causes include:  A lung tumor or upper airway tumor.  A medical condition that damages the large air passageways (bronchiectasis).  A blood clot in the lungs (pulmonary embolism).  A medical condition that keeps your blood from clotting normally.  Breathing in (inhaling) a small foreign object.  Sometimes the cause is not known. Hemoptysis can be a sign of a minor or serious medical condition, so it is important to see your health care  provider. Follow these instructions at home:  Watch your condition for any changes.  Take over-the-counter and prescription medicines only as told by your health care provider.  If you were prescribed an antibiotic medicine, take it as told by your health care provider. Do not stop taking the antibiotic even if you start to feel better.  Return to your normal activities as told by your health care provider. Ask your health care provider what activities are safe for you.  Do not use any products that contain nicotine or tobacco, such as cigarettes and e-cigarettes. If you need help quitting, ask your health care provider.  Keep all follow-up visits as told by your health care provider. This is important. Contact a health care provider if:  You have a fever.  You cough up blood-streaked (blood-tinged) sputum. Get help right away if:  You cough up fresh blood or blood clots.  You have trouble breathing.  You have chest pain. This information is not intended to replace advice given to you by your health care provider. Make sure you discuss any questions you have with your health care provider. Document Released: 12/28/2001 Document Revised: 07/17/2016 Document Reviewed: 07/17/2016 Elsevier Interactive Patient Education  [name]2018 Elsevier Inc[employer]

## 2018-02-25 NOTE — Progress Notes (Signed)
Subjective:    Patient ID: Adam [name], male    DOB: 11/08/1922, 84 y.o.   MRN: [id number]  HPI 84 y.o. WM with history of HTN, afib, DM2 with CKD, he is on coumadin presents with sore throat, pain with cough. Last night he was taking off his hearing aids, everything went black and he fell to ground, denies hitting his head.  Son is here with him. He had  HCAP 12/27/2017, son states he has been going outside and shredding leaves without a mask.  He played 18 holes of golf on Monday, he drives but does not walk during it. He states Tuesday thought he did yard work and was very tired/fatigued, has had weakness.   Last CXR was 02/09/2018 and showed significant improvement in left lung infiltrate.  He is on 1/2 on Monday, 1 pill a day.   Lab Results  Component Value Date   INR 3.2 02/22/2018   INR 2.2 01/24/2018   INR 3.1 12/30/2017   PROTIME 18.1 04/09/2009    Blood pressure (!) 140/54, pulse 61, temperature (!) 97.5 F (36.4 C), resp. rate 12, height 5' 8.5" (1.74 m), weight 161 lb 12.8 oz (73.4 kg), SpO2 94 %.  Medications Current Outpatient Medications on File Prior to Visit  Medication Sig  . aspirin 81 MG tablet Take 81 mg by mouth daily.    Marland Kitchen. atorvastatin (LIPITOR) 40 MG tablet Take 1 tablet (40 mg total) by mouth daily.  . Cholecalciferol (VITAMIN D-3) 1000 UNITS CAPS Take 4,000 Units by mouth daily.  Marland Kitchen. levothyroxine (SYNTHROID, LEVOTHROID) 88 MCG tablet Take 1 tablet (88 mcg total) by mouth daily before breakfast. Take 30-60 min before any other medications or food in the morning.  . warfarin (COUMADIN) 2.5 MG tablet Take as directed by the coumadin clinic. Please do not take coumadin today or tomorrow. Check INR at your coumadin clinic tomorrow .Continue holding it until the INR falls below 3.   No current facility-administered medications on file prior to visit.     Problem list He has Essential hypertension; Coronary atherosclerosis; NEPHROLITHIASIS, HX OF; Atrial  fibrillation (HCC); Type 2 diabetes mellitus (HCC); Hyperlipidemia; Vitamin D deficiency; Medication management; CKD stage 4 due to type 2 diabetes mellitus (HCC); Hypothyroidism; Encounter for Medicare annual wellness exam; Warfarin-induced coagulopathy (HCC); and Bladder neck obstruction on their problem list.   Review of Systems  Constitutional: Positive for fatigue. Negative for chills, diaphoresis and fever.  HENT: Negative.   Respiratory: Positive for shortness of breath and wheezing. Negative for apnea, cough, choking and chest tightness.   Cardiovascular: Negative.  Negative for palpitations and leg swelling.  Gastrointestinal: Negative.   Neurological: Positive for syncope. Negative for dizziness, tremors, seizures, facial asymmetry, speech difficulty, weakness, light-headedness, numbness and headaches.       Objective:   Physical Exam  Constitutional: He is oriented to person, place, and time. He appears well-developed and well-nourished. No distress.  HENT:  Head: Normocephalic and atraumatic.  Right Ear: External ear normal.  Left Ear: External ear normal.  Mouth/Throat: Oropharynx is clear and moist.  Eyes: Pupils are equal, round, and reactive to light. Conjunctivae and EOM are normal.  Neck: Normal range of motion. Neck supple.  Cardiovascular: Normal rate, regular rhythm and normal heart sounds.  Pulmonary/Chest: Effort normal. No respiratory distress. He has decreased breath sounds in the right lower field and the left lower field. He has no wheezes. He has rhonchi in the right lower field. He has no rales.  He exhibits no tenderness.  Abdominal: Soft. Bowel sounds are normal. There is no tenderness. There is no rebound.  Musculoskeletal: Normal range of motion.  Lymphadenopathy:    He has no cervical adenopathy.  Neurological: He is alert and oriented to person, place, and time. No cranial nerve deficit.  Skin: Skin is warm and dry. He is not diaphoretic.  Psychiatric:  He has a normal mood and affect. His behavior is normal.      Assessment & Plan:  Oral was seen today for acute visit.  Diagnoses and all orders for this visit:  Chronic atrial fibrillation (HCC) Had coumadin checked 04/24 Not checked today  Weakness -     CBC with Differential/Platelet -     COMPLETE METABOLIC PANEL WITH GFR -     TSH - if any worsening symptoms go to the ER  Hemoptysis -     DG Chest 2 View; Future -     CT Chest Wo Contrast; Future- with multiple infections and now hemoptysis will get CT chest rule out mass -     benzonatate (TESSALON PERLES) 100 MG capsule; Take 1 capsule (100 mg total) by mouth 3 (three) times daily as needed for cough (Max: 600mg  per day). -     doxycycline (VIBRAMYCIN) 100 MG capsule; Take 1 capsule twice daily with food -     albuterol (PROAIR HFA) 108 (90 Base) MCG/ACT inhaler; Inhale 1 puff into the lungs every 6 (six) hours as needed for wheezing or shortness of breath. -

## 2018-02-27 ENCOUNTER — Inpatient Hospital Stay (HOSPITAL_COMMUNITY): Payer: Medicare Other

## 2018-02-27 ENCOUNTER — Other Ambulatory Visit: Payer: Self-pay

## 2018-02-27 ENCOUNTER — Encounter (HOSPITAL_COMMUNITY): Payer: Self-pay

## 2018-02-27 ENCOUNTER — Emergency Department (HOSPITAL_COMMUNITY): Payer: Medicare Other

## 2018-02-27 ENCOUNTER — Inpatient Hospital Stay (HOSPITAL_COMMUNITY)
Admission: EM | Admit: 2018-02-27 | Discharge: 2018-04-02 | DRG: 871 | Disposition: E | Payer: Medicare Other | Attending: Family Medicine | Admitting: Family Medicine

## 2018-02-27 DIAGNOSIS — Z7901 Long term (current) use of anticoagulants: Secondary | ICD-10-CM

## 2018-02-27 DIAGNOSIS — I481 Persistent atrial fibrillation: Secondary | ICD-10-CM | POA: Diagnosis not present

## 2018-02-27 DIAGNOSIS — I130 Hypertensive heart and chronic kidney disease with heart failure and stage 1 through stage 4 chronic kidney disease, or unspecified chronic kidney disease: Secondary | ICD-10-CM | POA: Diagnosis present

## 2018-02-27 DIAGNOSIS — Y95 Nosocomial condition: Secondary | ICD-10-CM | POA: Diagnosis present

## 2018-02-27 DIAGNOSIS — Z974 Presence of external hearing-aid: Secondary | ICD-10-CM

## 2018-02-27 DIAGNOSIS — E1122 Type 2 diabetes mellitus with diabetic chronic kidney disease: Secondary | ICD-10-CM | POA: Diagnosis present

## 2018-02-27 DIAGNOSIS — T45515A Adverse effect of anticoagulants, initial encounter: Secondary | ICD-10-CM | POA: Diagnosis present

## 2018-02-27 DIAGNOSIS — J810 Acute pulmonary edema: Secondary | ICD-10-CM | POA: Diagnosis present

## 2018-02-27 DIAGNOSIS — E785 Hyperlipidemia, unspecified: Secondary | ICD-10-CM | POA: Diagnosis present

## 2018-02-27 DIAGNOSIS — Z79899 Other long term (current) drug therapy: Secondary | ICD-10-CM

## 2018-02-27 DIAGNOSIS — N189 Chronic kidney disease, unspecified: Secondary | ICD-10-CM

## 2018-02-27 DIAGNOSIS — E872 Acidosis, unspecified: Secondary | ICD-10-CM

## 2018-02-27 DIAGNOSIS — I350 Nonrheumatic aortic (valve) stenosis: Secondary | ICD-10-CM

## 2018-02-27 DIAGNOSIS — H9190 Unspecified hearing loss, unspecified ear: Secondary | ICD-10-CM | POA: Diagnosis present

## 2018-02-27 DIAGNOSIS — I5033 Acute on chronic diastolic (congestive) heart failure: Secondary | ICD-10-CM | POA: Diagnosis present

## 2018-02-27 DIAGNOSIS — E538 Deficiency of other specified B group vitamins: Secondary | ICD-10-CM | POA: Diagnosis present

## 2018-02-27 DIAGNOSIS — B952 Enterococcus as the cause of diseases classified elsewhere: Secondary | ICD-10-CM | POA: Diagnosis present

## 2018-02-27 DIAGNOSIS — D6832 Hemorrhagic disorder due to extrinsic circulating anticoagulants: Secondary | ICD-10-CM | POA: Diagnosis present

## 2018-02-27 DIAGNOSIS — I482 Chronic atrial fibrillation: Secondary | ICD-10-CM

## 2018-02-27 DIAGNOSIS — J189 Pneumonia, unspecified organism: Secondary | ICD-10-CM | POA: Diagnosis present

## 2018-02-27 DIAGNOSIS — F411 Generalized anxiety disorder: Secondary | ICD-10-CM | POA: Diagnosis present

## 2018-02-27 DIAGNOSIS — J9690 Respiratory failure, unspecified, unspecified whether with hypoxia or hypercapnia: Secondary | ICD-10-CM | POA: Diagnosis present

## 2018-02-27 DIAGNOSIS — I10 Essential (primary) hypertension: Secondary | ICD-10-CM | POA: Diagnosis present

## 2018-02-27 DIAGNOSIS — Z66 Do not resuscitate: Secondary | ICD-10-CM | POA: Diagnosis present

## 2018-02-27 DIAGNOSIS — IMO0002 Reserved for concepts with insufficient information to code with codable children: Secondary | ICD-10-CM | POA: Diagnosis present

## 2018-02-27 DIAGNOSIS — R0602 Shortness of breath: Secondary | ICD-10-CM

## 2018-02-27 DIAGNOSIS — R6520 Severe sepsis without septic shock: Secondary | ICD-10-CM | POA: Diagnosis not present

## 2018-02-27 DIAGNOSIS — Z7982 Long term (current) use of aspirin: Secondary | ICD-10-CM

## 2018-02-27 DIAGNOSIS — N184 Chronic kidney disease, stage 4 (severe): Secondary | ICD-10-CM | POA: Diagnosis present

## 2018-02-27 DIAGNOSIS — I447 Left bundle-branch block, unspecified: Secondary | ICD-10-CM | POA: Diagnosis present

## 2018-02-27 DIAGNOSIS — J9621 Acute and chronic respiratory failure with hypoxia: Secondary | ICD-10-CM | POA: Diagnosis present

## 2018-02-27 DIAGNOSIS — A419 Sepsis, unspecified organism: Principal | ICD-10-CM | POA: Diagnosis present

## 2018-02-27 DIAGNOSIS — J181 Lobar pneumonia, unspecified organism: Secondary | ICD-10-CM | POA: Diagnosis present

## 2018-02-27 DIAGNOSIS — N179 Acute kidney failure, unspecified: Secondary | ICD-10-CM | POA: Diagnosis present

## 2018-02-27 DIAGNOSIS — R791 Abnormal coagulation profile: Secondary | ICD-10-CM | POA: Diagnosis present

## 2018-02-27 DIAGNOSIS — I2510 Atherosclerotic heart disease of native coronary artery without angina pectoris: Secondary | ICD-10-CM | POA: Diagnosis present

## 2018-02-27 DIAGNOSIS — E1165 Type 2 diabetes mellitus with hyperglycemia: Secondary | ICD-10-CM | POA: Diagnosis present

## 2018-02-27 DIAGNOSIS — E782 Mixed hyperlipidemia: Secondary | ICD-10-CM | POA: Diagnosis present

## 2018-02-27 DIAGNOSIS — Z79890 Hormone replacement therapy (postmenopausal): Secondary | ICD-10-CM

## 2018-02-27 DIAGNOSIS — D72829 Elevated white blood cell count, unspecified: Secondary | ICD-10-CM | POA: Diagnosis present

## 2018-02-27 DIAGNOSIS — E871 Hypo-osmolality and hyponatremia: Secondary | ICD-10-CM | POA: Diagnosis present

## 2018-02-27 DIAGNOSIS — R0989 Other specified symptoms and signs involving the circulatory and respiratory systems: Secondary | ICD-10-CM

## 2018-02-27 DIAGNOSIS — I4891 Unspecified atrial fibrillation: Secondary | ICD-10-CM | POA: Diagnosis present

## 2018-02-27 DIAGNOSIS — I480 Paroxysmal atrial fibrillation: Secondary | ICD-10-CM | POA: Diagnosis not present

## 2018-02-27 DIAGNOSIS — E039 Hypothyroidism, unspecified: Secondary | ICD-10-CM | POA: Diagnosis present

## 2018-02-27 DIAGNOSIS — R0682 Tachypnea, not elsewhere classified: Secondary | ICD-10-CM

## 2018-02-27 DIAGNOSIS — J9601 Acute respiratory failure with hypoxia: Secondary | ICD-10-CM | POA: Diagnosis not present

## 2018-02-27 DIAGNOSIS — I214 Non-ST elevation (NSTEMI) myocardial infarction: Secondary | ICD-10-CM | POA: Diagnosis not present

## 2018-02-27 DIAGNOSIS — E870 Hyperosmolality and hypernatremia: Secondary | ICD-10-CM | POA: Diagnosis not present

## 2018-02-27 DIAGNOSIS — D539 Nutritional anemia, unspecified: Secondary | ICD-10-CM

## 2018-02-27 DIAGNOSIS — Z87442 Personal history of urinary calculi: Secondary | ICD-10-CM

## 2018-02-27 HISTORY — DX: Hypothyroidism, unspecified: E03.9

## 2018-02-27 LAB — MRSA PCR SCREENING: MRSA BY PCR: NEGATIVE

## 2018-02-27 LAB — LACTIC ACID, PLASMA
LACTIC ACID, VENOUS: 5.8 mmol/L — AB (ref 0.5–1.9)
LACTIC ACID, VENOUS: 2.7 mmol/L — AB (ref 0.5–1.9)
LACTIC ACID, VENOUS: 4.9 mmol/L — AB (ref 0.5–1.9)

## 2018-02-27 LAB — BASIC METABOLIC PANEL
Anion gap: 25 — ABNORMAL HIGH (ref 5–15)
BUN: 66 mg/dL — AB (ref 6–20)
CALCIUM: 8.3 mg/dL — AB (ref 8.9–10.3)
CO2: 9 mmol/L — ABNORMAL LOW (ref 22–32)
CREATININE: 3.03 mg/dL — AB (ref 0.61–1.24)
Chloride: 99 mmol/L — ABNORMAL LOW (ref 101–111)
GFR calc Af Amer: 19 mL/min — ABNORMAL LOW (ref >60)
GFR calc non Af Amer: 17 mL/min — ABNORMAL LOW (ref >60)
GLUCOSE: 229 mg/dL — AB (ref 65–99)
Potassium: 4.8 mmol/L (ref 3.5–5.1)
Sodium: 133 mmol/L — ABNORMAL LOW (ref 135–145)

## 2018-02-27 LAB — CBC WITH DIFFERENTIAL/PLATELET
BASOS ABS: 0 10*3/uL (ref 0.0–0.1)
BASOS PCT: 0 %
EOS ABS: 0 10*3/uL (ref 0.0–0.7)
Eosinophils Relative: 0 %
HCT: 31.7 % — ABNORMAL LOW (ref 39.0–52.0)
HEMOGLOBIN: 9.7 g/dL — AB (ref 13.0–17.0)
LYMPHS ABS: 1.3 10*3/uL (ref 0.7–4.0)
Lymphocytes Relative: 8 %
MCH: 31.2 pg (ref 26.0–34.0)
MCHC: 30.6 g/dL (ref 30.0–36.0)
MCV: 101.9 fL — ABNORMAL HIGH (ref 78.0–100.0)
Monocytes Absolute: 1.5 10*3/uL — ABNORMAL HIGH (ref 0.1–1.0)
Monocytes Relative: 9 %
NEUTROS ABS: 13.6 10*3/uL — AB (ref 1.7–7.7)
Neutrophils Relative %: 83 %
Platelets: 235 10*3/uL (ref 150–400)
RBC: 3.11 MIL/uL — ABNORMAL LOW (ref 4.22–5.81)
RDW: 15.1 % (ref 11.5–15.5)
WBC: 16.4 10*3/uL — ABNORMAL HIGH (ref 4.0–10.5)

## 2018-02-27 LAB — URINALYSIS, ROUTINE W REFLEX MICROSCOPIC
Bilirubin Urine: NEGATIVE
GLUCOSE, UA: 50 mg/dL — AB
KETONES UR: NEGATIVE mg/dL
LEUKOCYTES UA: NEGATIVE
NITRITE: NEGATIVE
PH: 5 (ref 5.0–8.0)
Protein, ur: NEGATIVE mg/dL
SPECIFIC GRAVITY, URINE: 1.01 (ref 1.005–1.030)

## 2018-02-27 LAB — PROTIME-INR
INR: 3.09
PROTHROMBIN TIME: 31.7 s — AB (ref 11.4–15.2)

## 2018-02-27 LAB — STREP PNEUMONIAE URINARY ANTIGEN: STREP PNEUMO URINARY ANTIGEN: NEGATIVE

## 2018-02-27 LAB — GLUCOSE, CAPILLARY
Glucose-Capillary: 166 mg/dL — ABNORMAL HIGH (ref 65–99)
Glucose-Capillary: 145 mg/dL — ABNORMAL HIGH (ref 65–99)

## 2018-02-27 LAB — TROPONIN I
TROPONIN I: 6.35 ng/mL — AB (ref <0.03)
TROPONIN I: 10.41 ng/mL — AB (ref <0.03)
TROPONIN I: 9.13 ng/mL — AB (ref <0.03)

## 2018-02-27 LAB — I-STAT CG4 LACTIC ACID, ED: Lactic Acid, Venous: 4.87 mmol/L (ref 0.5–1.9)

## 2018-02-27 LAB — HEMOGLOBIN A1C
HEMOGLOBIN A1C: 6.9 % — AB (ref 4.8–5.6)
MEAN PLASMA GLUCOSE: 151.33 mg/dL

## 2018-02-27 LAB — PROCALCITONIN: Procalcitonin: 1.13 ng/mL

## 2018-02-27 LAB — T4, FREE: Free T4: 2.05 ng/dL — ABNORMAL HIGH (ref 0.82–1.77)

## 2018-02-27 LAB — INFLUENZA PANEL BY PCR (TYPE A & B)
INFLAPCR: NEGATIVE
INFLBPCR: NEGATIVE

## 2018-02-27 LAB — I-STAT TROPONIN, ED: TROPONIN I, POC: 4.13 ng/mL — AB (ref 0.00–0.08)

## 2018-02-27 LAB — BRAIN NATRIURETIC PEPTIDE: B Natriuretic Peptide: 1044.6 pg/mL — ABNORMAL HIGH (ref 0.0–100.0)

## 2018-02-27 LAB — SEDIMENTATION RATE

## 2018-02-27 LAB — CBG MONITORING, ED: GLUCOSE-CAPILLARY: 211 mg/dL — AB (ref 65–99)

## 2018-02-27 MED ORDER — VANCOMYCIN HCL 10 G IV SOLR
1250.0000 mg | Freq: Once | INTRAVENOUS | Status: AC
  Administered 2018-02-27: 1250 mg via INTRAVENOUS
  Filled 2018-02-27: qty 1250

## 2018-02-27 MED ORDER — VANCOMYCIN HCL IN DEXTROSE 1-5 GM/200ML-% IV SOLN: 1000.0000 mg | INTRAVENOUS | Status: DC

## 2018-02-27 MED ORDER — FUROSEMIDE 10 MG/ML IJ SOLN
40.0000 mg | Freq: Once | INTRAMUSCULAR | Status: AC
  Administered 2018-02-27: 40 mg via INTRAVENOUS
  Filled 2018-02-27: qty 4

## 2018-02-27 MED ORDER — SODIUM CHLORIDE 0.9 % IV SOLN
1.0000 g | INTRAVENOUS | Status: DC
  Administered 2018-02-28 – 2018-03-04 (×5): 1 g via INTRAVENOUS
  Filled 2018-02-27 (×6): qty 1

## 2018-02-27 MED ORDER — ONDANSETRON HCL 4 MG/2ML IJ SOLN: 4.0000 mg | Freq: Four times a day (QID) | INTRAMUSCULAR | Status: DC | PRN

## 2018-02-27 MED ORDER — PIPERACILLIN-TAZOBACTAM 3.375 G IVPB 30 MIN
3.3750 g | Freq: Once | INTRAVENOUS | Status: DC
  Filled 2018-02-27: qty 50

## 2018-02-27 MED ORDER — WARFARIN - PHARMACIST DOSING INPATIENT: Freq: Every day | Status: DC

## 2018-02-27 MED ORDER — SODIUM CHLORIDE 0.9% FLUSH
3.0000 mL | Freq: Two times a day (BID) | INTRAVENOUS | Status: DC
  Administered 2018-02-27 – 2018-02-28 (×3): 3 mL via INTRAVENOUS

## 2018-02-27 MED ORDER — SODIUM CHLORIDE 0.9 % IV SOLN: 250.0000 mL | INTRAVENOUS | Status: DC | PRN

## 2018-02-27 MED ORDER — ACETAMINOPHEN 325 MG PO TABS: 650.0000 mg | ORAL_TABLET | ORAL | Status: DC | PRN

## 2018-02-27 MED ORDER — INSULIN ASPART 100 UNIT/ML ~~LOC~~ SOLN
0.0000 [IU] | SUBCUTANEOUS | Status: DC
  Administered 2018-02-27: 1 [IU] via SUBCUTANEOUS
  Administered 2018-02-27: 3 [IU] via SUBCUTANEOUS
  Administered 2018-02-28 (×4): 2 [IU] via SUBCUTANEOUS
  Filled 2018-02-27: qty 1

## 2018-02-27 MED ORDER — LEVOTHYROXINE SODIUM 100 MCG IV SOLR
44.0000 ug | Freq: Every day | INTRAVENOUS | Status: DC
  Administered 2018-02-27 – 2018-02-28 (×2): 44 ug via INTRAVENOUS
  Filled 2018-02-27 (×2): qty 5

## 2018-02-27 MED ORDER — FUROSEMIDE 10 MG/ML IJ SOLN
40.0000 mg | Freq: Once | INTRAMUSCULAR | Status: AC
  Administered 2018-02-27: 40 mg via INTRAMUSCULAR
  Filled 2018-02-27: qty 4

## 2018-02-27 MED ORDER — WARFARIN SODIUM 2.5 MG PO TABS
2.5000 mg | ORAL_TABLET | Freq: Once | ORAL | Status: AC
  Administered 2018-02-27: 2.5 mg via ORAL
  Filled 2018-02-27: qty 1

## 2018-02-27 MED ORDER — SODIUM CHLORIDE 0.9% FLUSH: 3.0000 mL | INTRAVENOUS | Status: DC | PRN

## 2018-02-27 MED ORDER — SODIUM CHLORIDE 0.9 % IV SOLN
2.0000 g | Freq: Once | INTRAVENOUS | Status: AC
  Administered 2018-02-27: 2 g via INTRAVENOUS
  Filled 2018-02-27: qty 2

## 2018-02-27 MED ORDER — ASPIRIN 81 MG PO CHEW
324.0000 mg | CHEWABLE_TABLET | Freq: Once | ORAL | Status: AC
  Administered 2018-02-27: 324 mg via ORAL
  Filled 2018-02-27: qty 4

## 2018-02-27 MED ORDER — AMIODARONE HCL 200 MG PO TABS
200.0000 mg | ORAL_TABLET | Freq: Every day | ORAL | Status: DC
  Administered 2018-02-28: 200 mg via ORAL
  Filled 2018-02-27: qty 1

## 2018-02-27 MED ORDER — PIPERACILLIN-TAZOBACTAM 3.375 G IVPB: 3.3750 g | Freq: Two times a day (BID) | INTRAVENOUS | Status: DC

## 2018-02-27 NOTE — Progress Notes (Signed)
ANTICOAGULATION CONSULT NOTE - Initial Consult  Pharmacy Consult for Heparin (when INR <2) Indication: chest pain/ACS, afib  No Known Allergies  73 kg  Vital Signs: Temp: 97.6 F (36.4 C) (04/28 0604) BP: 133/53 (04/28 0607) Pulse Rate: 95 (04/28 0604)  Labs: Recent Labs    02/14/2018 0607  HGB 9.7*  HCT 31.7*  PLT 235  LABPROT 31.7*  INR 3.09    Estimated Creatinine Clearance: 17.6 mL/min (A) (by C-G formula based on SCr of 2.63 mg/dL (H)).   Medical History: Past Medical History:  Diagnosis Date  . Atrial fibrillation (HCC)   . CORONARY ARTERY DISEASE   . HYPERTENSION   . Leg pain    a. ABI (05/2014):  ABIs falsely elevated due to calcification; TBIs ok; dopplers without significant stenosis  . MURMUR   . NEPHROLITHIASIS, HX OF   . SINUS BRADYCARDIA   . VITAMIN B12 DEFICIENCY    Assessment: 84 y/o M on warfarin PTA fo afib, presents to the ED today with resp distress, found to have elevated troponin, to start heparin when INR is <2, INR is currently 3.09, Hgb 9.7, Noted renal dysfunction.   Goal of Therapy:  Heparin level 0.3-0.7 units/ml Monitor platelets by anticoagulation protocol: Yes   Plan:  -Daily PT/INR -Start heparin when INR is <2  Silverio, Hagan 02/06/2018,6:38 AM

## 2018-02-27 NOTE — Consult Note (Addendum)
Cardiology Consultation:   Patient ID: Adam [name]; [id number]; Feb 25, 1923   Admit date: 2018/03/16 Date of Consult: 03-16-2018  Primary Care Provider: Lucky Cowboy, MD Primary Cardiologist: Dr Eden Emms   Patient Profile:   ISRRAEL [name] is a 84 y.o. male with a hx of AF, HTN, CRI, and CAD who is being seen today for the evaluation of respiratory failure at the request of Dr Dr Preston Fleeting.  History of Present Illness:   [name] is a pleasant, active 84 y/o (played 18 holes of golf a week ago), followed by Dr Eden Emms. He has a history of remote CAD-s/p PCI in the 90's, last cath 2000 and the plan was for medical Rx. He has moderate AS with normal LVF by recent echo. He has PAF and has been on Amiodarone and Coumadin for almost 20 years. He has been in NSR, EKG Feb 2019 shows NSR. He was admitted in Feb 2019 with CAP, Amiodarone was stopped 01/31/18 secondary to low TSH.  He has been doing well till this past week. As noted he played 18 holes of golf last Monday, Tuesday did yard work. Thursday he was at his PCP's office to f/u a recent bump in his SCr. He complained of DOE and fatigue. The next day he complained of cough (hemoptysis) and an episode of syncope. CXR suggested bilateral infiltrates. His Coumadin was held and he was placed on Vibramycin. His wife says he has become progressively more weak and SOB. This AM he was brought to the ED by EMS in respiratory distress requiring Bi Pap. He is noted to be in AF with RVR. BNP is 1000, Troponin 4, BUN/SCr are up 66/3.03, and his WBC is 16k (afebrile). He denies any chest pain now or recently.    Past Medical History:  Diagnosis Date  . Atrial fibrillation (HCC)   . CORONARY ARTERY DISEASE   . HYPERTENSION   . Leg pain    a. ABI (05/2014):  ABIs falsely elevated due to calcification; TBIs ok; dopplers without significant stenosis  . MURMUR   . NEPHROLITHIASIS, HX OF   . SINUS BRADYCARDIA   . VITAMIN B12 DEFICIENCY     Past  Surgical History:  Procedure Laterality Date  . ANGIOPLASTY       Home Medications:  Prior to Admission medications   Medication Sig Start Date End Date Taking? Authorizing Provider  albuterol (PROAIR HFA) 108 (90 Base) MCG/ACT inhaler Inhale 1 puff into the lungs every 6 (six) hours as needed for wheezing or shortness of breath. 02/25/18  Yes [name], PA-C  aspirin 81 MG tablet Take 81 mg by mouth daily.     Yes [provider]  atorvastatin (LIPITOR) 40 MG tablet Take 1 tablet (40 mg total) by mouth daily. 10/13/17  Yes Lucky Cowboy, MD  benzonatate (TESSALON PERLES) 100 MG capsule Take 1 capsule (100 mg total) by mouth 3 (three) times daily as needed for cough (Max:  per day). 02/25/18  Yes [name], PA-C  Cholecalciferol (VITAMIN D-3) 1000 UNITS CAPS Take 4,000 Units by mouth daily.   Yes [provider]  doxycycline (VIBRAMYCIN) 100 MG capsule Take 1 capsule twice daily with food 02/25/18  Yes [name], PA-C  levothyroxine (SYNTHROID, LEVOTHROID) 88 MCG tablet Take 1 tablet (88 mcg total) by mouth daily before breakfast. Take 30-60 min before any other medications or food in the morning. 01/03/18  Yes Judd Gaudier, NP  warfarin (COUMADIN) 2.5 MG tablet Take as directed by the coumadin  clinic. Please do not take coumadin today or tomorrow. Check INR at your coumadin clinic tomorrow .Continue holding it until the INR falls below 3. 12/31/17   Lucky Cowboy, MD    Inpatient Medications: Scheduled Meds:  Continuous Infusions:  PRN Meds:   Allergies:   No Known Allergies  Social History:   Social History   Socioeconomic History  . Marital status: Married    Spouse name: Not on file  . Number of children: Not on file  . Years of education: Not on file  . Highest education level: Not on file  Occupational History  . Not on file  Social Needs  . Financial resource strain: Not on file  . Food insecurity:    Worry: Not on file     Inability: Not on file  . Transportation needs:    Medical: Not on file    Non-medical: Not on file  Tobacco Use  . Smoking status: Never Smoker  . Smokeless tobacco: Never Used  Substance and Sexual Activity  . Alcohol use: No  . Drug use: No  . Sexual activity: Not on file  Lifestyle  . Physical activity:    Days per week: Not on file    Minutes per session: Not on file  . Stress: Not on file  Relationships  . Social connections:    Talks on phone: Not on file    Gets together: Not on file    Attends religious service: Not on file    Active member of club or organization: Not on file    Attends meetings of clubs or organizations: Not on file    Relationship status: Not on file  . Intimate partner violence:    Fear of current or ex partner: Not on file    Emotionally abused: Not on file    Physically abused: Not on file    Forced sexual activity: Not on file  Other Topics Concern  . Not on file  Social History Narrative  . Not on file    Family History:    Family History  Problem Relation Age of Onset  . Colon polyps Unknown   . Heart disease Unknown      ROS:  Please see the history of present illness.  CAP Feb 2019, recent low TSH All other ROS reviewed and negative.     Physical Exam/Data:   Vitals:   02/04/2018 0615 02/16/2018 0630 02/22/2018 0700 02/09/2018 0739  BP: 95/77 (!) 115/58 (!) 119/58 117/62  Pulse: 95 91 85   Resp: (!) 33 (!) 32 (!) 26   Temp:      SpO2: 100% 100% 100% 100%   No intake or output data in the 24 hours ending 02/18/2018 0755 There were no vitals filed for this visit. There is no height or weight on file to calculate BMI.  General:  Elderly male, on Bi Pap HEENT: HOH Lymph: no adenopathy Endocrine:  No thryomegaly Vascular: No carotid bruits; FA pulses 2+ bilaterally without bruits  Cardiac:  Irregularly irregular with 2/6 systolic murmur Lungs:  Bilateral crackles Abd: soft, nontender, no hepatomegaly  Ext: trace  edema Musculoskeletal:  No deformities, BUE and BLE strength normal and equal Skin: pale, cool, and dry  Neuro:  CNs 2-12 intact, no focal abnormalities noted Psych:  Normal affect   EKG:  The EKG was personally reviewed and demonstrates:  AF with VR 103, LBBB  Relevant CV Studies: Echo 01/31/18- Study Conclusions  - Left ventricle: The cavity size  was normal. Wall thickness was   increased in a pattern of moderate LVH. Systolic function was   normal. The estimated ejection fraction was in the range of 60%   to 65%. The study is not technically sufficient to allow   evaluation of LV diastolic function. - Aortic valve: There was moderate stenosis. There was mild   regurgitation. - Mitral valve: Small diastolic gradient but MVA above 2.0 cm 2.   Severely calcified annulus. Severely thickened, severely   calcified leaflets . - Left atrium: The atrium was moderately dilated. - Atrial septum: No defect or patent foramen ovale was identified.   Laboratory Data:  Chemistry Recent Labs  Lab Mar 01, 2018 0607  NA 133*  K 4.8  CL 99*  CO2 9*  GLUCOSE 229*  BUN 66*  CREATININE 3.03*  CALCIUM 8.3*  GFRNONAA 17*  GFRAA 19*  ANIONGAP 25*    No results for input(s): PROT, ALBUMIN, AST, ALT, ALKPHOS, BILITOT in the last 168 hours. Hematology Recent Labs  Lab 03/01/2018 0607  WBC 16.4*  RBC 3.11*  HGB 9.7*  HCT 31.7*  MCV 101.9*  MCH 31.2  MCHC 30.6  RDW 15.1  PLT 235   Cardiac EnzymesNo results for input(s): TROPONINI in the last 168 hours.  Recent Labs  Lab 03-01-18 0612  TROPIPOC 4.13*    BNP Recent Labs  Lab 03/01/18 0607  BNP 1,044.6*    DDimer No results for input(s): DDIMER in the last 168 hours.  Radiology/Studies:  Dg Chest 2 View  Result Date: 02/25/2018 CLINICAL DATA:  Cough, shortness of breath, hemoptysis, recent pneumonia EXAM: CHEST - 2 VIEW COMPARISON:  02/09/2018 FINDINGS: Upper normal size of cardiac silhouette. Atherosclerotic calcification  aorta. Mediastinal contours normal. Patchy BILATERAL airspace infiltrates which could represent pneumonia or LV lower hemorrhage in the setting of mild mop doses. Underlying chronic bronchitic changes. No pleural effusion or pneumothorax. Bones demineralized. IMPRESSION: Chronic bronchitic changes with superimposed BILATERAL patchy airspace infiltrates which could represent pneumonia or alveolar hemorrhage. Electronically Signed   By: Ulyses Southward M.D.   On: 02/25/2018 12:47   Dg Chest Port 1 View  Result Date: 03/01/2018 CLINICAL DATA:  Shortness of breath EXAM: PORTABLE CHEST 1 VIEW COMPARISON:  02/25/2018 FINDINGS: Shallow inspiration. Cardiac enlargement. Perihilar interstitial and alveolar infiltrates progressing since previous study. This suggests developing multifocal pneumonia or edema. Underlying chronic bronchitic changes. No blunting of costophrenic angles. No pneumothorax. Mediastinal contours appear intact. Calcification of the aorta. IMPRESSION: Increasing perihilar interstitial and alveolar infiltrates suggest developing multifocal pneumonia or edema. Underlying chronic bronchitic changes. Aortic atherosclerosis. Electronically Signed   By: Burman Nieves M.D.   On: 03-01-18 06:59    Assessment and Plan:   Respiratory failure- Acute CHF secondary to return of atrial fibrillation, possible CAP   PAF- Amiodarone stopped 01/31/18 secondary to low TSH  Chronic anticoagu;ation- INR 3- it has been therapeutic for the past 2 months  AKI- Bump in SCr and BUN from his baseline   Moderate AS- Normal LVF  Hypothyroid- Check TSH, free T4  NIDDM- NIDDM- BS up  CAD- Remote PCI, he denies any chest pain- Troponin 4  Plan: Pt seen by Dr Donnie Aho and myself. Will resume PO Amiodarone and consider cardioversion tomorrow. He recieved one dose of IV Lasix earlier, will repeat 40 mg IV now x1.. Check full TFTs, continue anticoagulation. Cycle Troponin.   For questions or updates, please  contact [facility] Please consult [email or web address] for contact info under Cardiology/STEMI.   Signed, Corine Shelter,  PA-C  03-11-18 7:55 AM   Patient was seen and examined early this morning.  He has a history of atrial fibrillation and has been on chronic amiodarone for years and been in sinus rhythm.  When seen on April 1 by Dr. Eden Emms he had a mildly abnormal thyroid test and was taken off of amiodarone because of this and age.  One week ago the patient was able to play golf.  He was able to go 18 holes riding a golf cart. He became somewhat weak and later on in the week and developed some hemoptysis as well as increasing cough and was placed on antibiotics by his primary care doctor on Thursday.  He developed worsening dyspnea and was brought into the emergency room with respiratory distress this morning.  He was found to be in new onset of atrial fibrillation and had heart failure with ground glass appearance on the CT scan as well as elevated BNP of 1000.  He also had a slight increase in the amount of his creatinine and kidney function.  He has known moderate stenosis with an echocardiogram showing moderate aortic stenosis as well as an EF of around 60% earlier this month.  1.  Concerned that being off of amiodarone his lead to recurrence of atrial fibrillation which has caused diastolic heart failure due to moderate aortic stenosis as well as previous hypertensive heart disease 2.  Elevation of troponin likely due to demand ischemia cannot rule out component of non-STEMI 3.  Acute diastolic heart failure 4.  Possible recent upper respiratory infection with hemoptysis  Recommendations:  Agree with above.  We will recheck his thyroid functions and we will continue his warfarin.  Intravenous Lasix to treat volume overload.  He just had an echo a month ago settled think we need to repeat this.  Continue to cycle troponins.  I would favor an early cardioversion and to restart his amiodarone.   Cardioversion discussed with patient and family and will keep nothing by mouth for possible cardioversion tomorrow.  He has been therapeutic for the past month on his warfarin and will continue to maintain that.  Continue antibiotics.

## 2018-02-27 NOTE — Progress Notes (Signed)
Patient went to CT and was not able to tolerate lying flat on a nasal cannula. RT went to CT and placed patient back on bipap at this time.

## 2018-02-27 NOTE — Progress Notes (Signed)
ANTICOAGULATION CONSULT NOTE - Follow Up Consult  Pharmacy Consult for Warfarin Indication: atrial fibrillation  No Known Allergies  73 kg  Vital Signs: Temp: 97.4 F (36.3 C) (04/28 1740) Temp Source: Oral (04/28 1740) BP: 113/91 (04/28 1740) Pulse Rate: 89 (04/28 1740)  Labs: Recent Labs    02/20/2018 0607 02/01/2018 0935 02/15/2018 1619  HGB 9.7*  --   --   HCT 31.7*  --   --   PLT 235  --   --   LABPROT 31.7*  --   --   INR 3.09  --   --   CREATININE 3.03*  --   --   TROPONINI  --  6.35* 10.41*    Estimated Creatinine Clearance: 15.3 mL/min (A) (by C-G formula based on SCr of 3.03 mg/dL (H)).   Medical History: Past Medical History:  Diagnosis Date  . Atrial fibrillation (HCC)   . CORONARY ARTERY DISEASE   . HYPERTENSION   . Hypothyroidism   . Leg pain    a. ABI (05/2014):  ABIs falsely elevated due to calcification; TBIs ok; dopplers without significant stenosis  . MURMUR   . NEPHROLITHIASIS, HX OF   . SINUS BRADYCARDIA   . VITAMIN B12 DEFICIENCY    Assessment: 84 y/o M on warfarin PTA fo afib, presents to the ED today with resp distress, found to have elevated troponin and started on IV heparin. IV heparin is now d/c'ed and pharmacy consulted to resume warfarin.   Home warfarin dose: 2.5 mg daily except 1.25 mg on Monday   Goal of Therapy:  INR 2-3 Monitor platelets by anticoagulation protocol: Yes   Plan:  -Warfarin 2.5 mg x 1 dose today -Monitor daily PT/INR   Vinnie Level, PharmD., BCPS Clinical Pharmacist If after 3:30pm, please call main pharmacy at: [phone]

## 2018-02-27 NOTE — ED Provider Notes (Signed)
[facility] EMERGENCY DEPARTMENT Provider Note   CSN: [id number] Arrival date & time: 02/26/2018  0600     History   Chief Complaint Chief Complaint  Patient presents with  . Respiratory Distress    HPI Adam [name] is a 84 y.o. male.   The history is provided by the patient and the EMS personnel.  He has a history of hypertension, coronary artery disease, atrial fibrillation, chronic kidney disease, diabetes and is anticoagulated on warfarin and is brought in by EMS because of respiratory distress.  Apparently, he had played 18 holes of golf yesterday, but developed dyspnea tonight.  Denies chest pain, heaviness, tightness, pressure.  EMS noted oxygen saturation of 73% and placed him on CPAP and transported to the ED.  He did not receive any medication in route.  He denies any fever or cough and denies any chest pain.  Past Medical History:  Diagnosis Date  . Atrial fibrillation (HCC)   . CORONARY ARTERY DISEASE   . HYPERTENSION   . Leg pain    a. ABI (05/2014):  ABIs falsely elevated due to calcification; TBIs ok; dopplers without significant stenosis  . MURMUR   . NEPHROLITHIASIS, HX OF   . SINUS BRADYCARDIA   . VITAMIN B12 DEFICIENCY     Patient Active Problem List   Diagnosis Date Noted  . 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 Warfarin-induced coagulopathy (HCC) 12/03/2015  . Hypothyroidism 05/28/2015  . Encounter for Medicare annual wellness exam 05/28/2015  . CKD stage 4 due to type 2 diabetes mellitus (HCC) 01/29/2015  . Medication management 10/23/2014  . Type 2 diabetes mellitus (HCC) 10/17/2013  . Hyperlipidemia 10/17/2013  . Vitamin D deficiency 10/17/2013  . Atrial fibrillation (HCC) 12/16/2010  . Essential hypertension 01/13/2008  . Coronary atherosclerosis 01/13/2008  . NEPHROLITHIASIS, HX OF 01/13/2008    Past Surgical History:  Procedure Laterality Date  . ANGIOPLASTY          Home Medications    Prior to Admission  medications   Medication Sig Start Date End Date Taking? Authorizing Provider  albuterol (PROAIR HFA) 108 (90 Base) MCG/ACT inhaler Inhale 1 puff into the lungs every 6 (six) hours as needed for wheezing or shortness of breath. 02/25/18   [name], PA-C  aspirin 81 MG tablet Take 81 mg by mouth daily.      [provider]  atorvastatin (LIPITOR) 40 MG tablet Take 1 tablet (40 mg total) by mouth daily. 10/13/17   Lucky Cowboy, MD  benzonatate (TESSALON PERLES) 100 MG capsule Take 1 capsule (100 mg total) by mouth 3 (three) times daily as needed for cough (Max:  per day). 02/25/18   [name], PA-C  Cholecalciferol (VITAMIN D-3) 1000 UNITS CAPS Take 4,000 Units by mouth daily.    [provider]  doxycycline (VIBRAMYCIN) 100 MG capsule Take 1 capsule twice daily with food 02/25/18   [name], PA-C  levothyroxine (SYNTHROID, LEVOTHROID) 88 MCG tablet Take 1 tablet (88 mcg total) by mouth daily before breakfast. Take 30-60 min before any other medications or food in the morning. 01/03/18   Judd Gaudier, NP  warfarin (COUMADIN) 2.5 MG tablet Take as directed by the coumadin clinic. Please do not take coumadin today or tomorrow. Check INR at your coumadin clinic tomorrow .Continue holding it until the INR falls below 3. 12/31/17   Lucky Cowboy,[facility] Family History  Problem Relation Age of Onset  . Colon polyps Unknown   .  Heart disease Unknown     Social History Social History   Tobacco Use  . Smoking status: Never Smoker  . Smokeless tobacco: Never Used  Substance Use Topics  . Alcohol use: No  . Drug use: No     Allergies   Patient has no known allergies.   Review of Systems Review of Systems  Unable to perform ROS: Acuity of condition     Physical Exam Updated Vital Signs BP (!) 133/53   Pulse 95   Temp 97.6 F (36.4 C)   Resp (!) 36   SpO2 100%   Physical Exam  Nursing note and vitals reviewed.  84 year  old male, moderately dyspneic and on BiPAP. Vital signs are significant for tachypnea. Oxygen saturation is 100%, which is normal. Head is normocephalic and atraumatic. PERRLA, EOMI. Oropharynx is clear. Neck is nontender and supple without adenopathy or JVD.  Positive HJR noted. Back is nontender and there is no CVA tenderness. Lungs have rales diffusely with a prolonged exhalation phase, but no wheezes or rhonchi. Chest is nontender. Heart has regular rate and rhythm with 2/6 harsh systolic ejection murmur heard at the cardiac base. Abdomen is soft, flat, nontender without masses or hepatosplenomegaly and peristalsis is normoactive. Extremities have 1+ edema, full range of motion is present. Skin is warm and dry without rash. Neurologic: Mental status is normal, cranial nerves are intact, there are no motor or sensory deficits.  ED Treatments / Results  Labs (all labs ordered are listed, but only abnormal results are displayed) Labs Reviewed  BASIC METABOLIC PANEL - Abnormal; Notable for the following components:      Result Value   Sodium 133 (*)    Chloride 99 (*)    CO2 9 (*)    Glucose, Bld 229 (*)    BUN 66 (*)    Creatinine, Ser 3.03 (*)    Calcium 8.3 (*)    GFR calc non Af Amer 17 (*)    GFR calc Af Amer 19 (*)    Anion gap 25 (*)    All other components within normal limits  CBC WITH DIFFERENTIAL/PLATELET - Abnormal; Notable for the following components:   WBC 16.4 (*)    RBC 3.11 (*)    Hemoglobin 9.7 (*)    HCT 31.7 (*)    MCV 101.9 (*)    Neutro Abs 13.6 (*)    Monocytes Absolute 1.5 (*)    All other components within normal limits  BRAIN NATRIURETIC PEPTIDE - Abnormal; Notable for the following components:   B Natriuretic Peptide 1,044.6 (*)    All other components within normal limits  PROTIME-INR - Abnormal; Notable for the following components:   Prothrombin Time 31.7 (*)    All other components within normal limits  I-STAT TROPONIN, ED - Abnormal;  Notable for the following components:   Troponin i, poc 4.13 (*)    All other components within normal limits    EKG EKG Interpretation  Date/Time:  Sunday February 27 2018 06:01:52 EDT Ventricular Rate:  103 PR Interval:    QRS Duration: 145 QT Interval:  385 QTC Calculation: 504 R Axis:   -63 Text Interpretation:  Atrial fibrillation Nonspecific IVCD with LAD LVH with secondary repolarization abnormality Anterior Q waves, possibly due to LVH When compared with ECG of 12/27/2017, Atrial fibrillation has replaced Sinus rhythm Confirmed by Dione Booze ([id number]) on 02/07/2018 6:15:22 AM   Radiology Dg Chest 2 View  Result Date: 02/25/2018 CLINICAL DATA:  Cough, shortness of breath, hemoptysis, recent pneumonia EXAM: CHEST - 2 VIEW COMPARISON:  02/09/2018 FINDINGS: Upper normal size of cardiac silhouette. Atherosclerotic calcification aorta. Mediastinal contours normal. Patchy BILATERAL airspace infiltrates which could represent pneumonia or LV lower hemorrhage in the setting of mild mop doses. Underlying chronic bronchitic changes. No pleural effusion or pneumothorax. Bones demineralized. IMPRESSION: Chronic bronchitic changes with superimposed BILATERAL patchy airspace infiltrates which could represent pneumonia or alveolar hemorrhage. Electronically Signed   By: Ulyses Southward M.D.   On: 02/25/2018 12:47    Procedures Procedures  CRITICAL CARE Performed by: Dione Booze Total critical care time: 90 minutes Critical care time was exclusive of separately billable procedures and treating other patients. Critical care was necessary to treat or prevent imminent or life-threatening deterioration. Critical care was time spent personally by me on the following activities: development of treatment plan with patient and/or surrogate as well as nursing, discussions with consultants, evaluation of patient's response to treatment, examination of patient, obtaining history from patient or surrogate, ordering  and performing treatments and interventions, ordering and review of laboratory studies, ordering and review of radiographic studies, pulse oximetry and re-evaluation of patient's condition.  Medications Ordered in ED Medications  aspirin chewable tablet 324 mg (has no administration in time range)  furosemide (LASIX) injection 40 mg (has no administration in time range)     Initial Impression / Assessment and Plan / ED Course  I have reviewed the triage vital signs and the nursing notes.  Pertinent labs & imaging results that were available during my care of the patient were reviewed by me and considered in my medical decision making (see chart for details).  Acute dyspnea with clinical findings suggestive of heart failure.  Old records are reviewed, and he does have prior hospitalizations for healthcare associated pneumonia, and is followed by cardiology for aortic stenosis and paroxysmal atrial fibrillation.  ECG shows atrial fibrillation and left bundle branch block, with atrial fibrillation being a change from most recent ECG.  Initial troponin is significantly elevated at 4.13 indicating ACS.  He is already anticoagulated on warfarin, pharmacy consulted for transition to heparin.  He is given oral aspirin.  Chest x-ray appears to show pulmonary edema per my reading, and he is given dose of furosemide.  Hemoglobin is 9.7 which is his baseline.  Family has arrived and states that actually he has been having some difficulty over the last several days and had been started on an antibiotic 2 days ago because of an abnormal chest x-ray.  His son showed me current medication list and he is currently on doxycycline.  His son also states that he did not play golf yesterday, that it was about a week ago that he had last played golf.  I have reviewed the chest x-ray from 2 days ago, and it does appear to show early pulmonary edema per my reading.  INR today is slightly supratherapeutic.  Case is discussed  with Dr. Dimple Casey of cardiology service who agrees to admit the patient.  Also, monitor has shown conversion back to sinus rhythm.  BNP is come back markedly elevated, consistent with CHF/pulmonary edema.  Metabolic panel shows worsening renal insufficiency with marked metabolic acidosis and elevated anion gap.  CHA2DS2/VAS Stroke Risk Points  Current as of 2 minutes ago     5 >= 2 Points: High Risk  1 - 1.99 Points: Medium Risk  0 Points: Low Risk    This is the only CHA2DS2/VAS Stroke Risk Points available for  the past  year.:  Last Change: N/A     Details    This score determines the patient's risk of having a stroke if the  patient has atrial fibrillation.       Points Metrics  0 Has Congestive Heart Failure:  No    Current as of 2 minutes ago  1 Has Vascular Disease:  Yes    Current as of 2 minutes ago  1 Has Hypertension:  Yes    Current as of 2 minutes ago  2 Age:  56    Current as of 2 minutes ago  1 Has Diabetes:  Yes    Current as of 2 minutes ago  0 Had Stroke:  No  Had TIA:  No  Had thromboembolism:  No    Current as of 2 minutes ago  0 Male:  No    Current as of 2 minutes ago   Final Clinical Impressions(s) / ED Diagnoses   Final diagnoses:  Acute pulmonary edema (HCC)  NSTEMI (non-ST elevated myocardial infarction) (HCC)  Paroxysmal atrial fibrillation (HCC)  Macrocytic anemia  Acute renal failure superimposed on stage 4 chronic kidney disease, unspecified acute renal failure type [facility])  Metabolic acidosis    ED Discharge Orders    None      Dione Booze, MD 03/01/2018 0730

## 2018-02-27 NOTE — Progress Notes (Addendum)
Pharmacy Antibiotic Note  Adam [name] is a 84 y.o. male admitted on 02/08/2018 with pneumonia.  Pharmacy has been consulted for vancomycin and Zosyn dosing.  Plan: Start Zosyn 3.375 gm IV q12h (4 hour infusion) Give vancomycin 1,250mg  IV x 1, then start vancomycin 1g IV Q48h Monitor clinical picture, renal function, VT prn F/U C&S, abx deescalation / LOT  ADDENDUM  Stop Zosyn, give cefepime 2g IV x1, then start cefepime 1g IV Q24h    Temp (24hrs), Avg:97.6 F (36.4 C), Min:97.6 F (36.4 C), Max:97.6 F (36.4 C)  Recent Labs  Lab 02/20/2018 0607  WBC 16.4*  CREATININE 3.03*    Estimated Creatinine Clearance: 15.3 mL/min (A) (by C-G formula based on SCr of 3.03 mg/dL (H)).    No Known Allergies  Thank you for allowing pharmacy to be a part of this patient's care.  Armandina Stammer 02/12/2018 8:41 AM

## 2018-02-27 NOTE — ED Notes (Signed)
Attempted report x1, nurse unable to take report at this moment. Asked for me to call back in 5 minutes

## 2018-02-27 NOTE — Progress Notes (Signed)
Pt transported from the ED to 2W16 via Bipap on current settings. No complications during transport. Timolyn Richardson Dopp RRT notified of patient arrival.

## 2018-02-27 NOTE — Progress Notes (Signed)
RT took patient off of bipap at this time. Patient currently has on a 6L [place] and is tolerating well. RT will monitor this patient.

## 2018-02-27 NOTE — ED Triage Notes (Signed)
Pt comes from home via [facility], for resp distress, pneumonia for the past month, worse over the last two days, last night became dizzy, diaphoretic 70% on RA on CPAP, EKG showing LBBB

## 2018-02-27 NOTE — ED Notes (Addendum)
Lactic 5.8 reported to [facility]

## 2018-02-27 NOTE — Progress Notes (Signed)
CRITICAL VALUE ALERT  Critical Value:  Lactic Acid 4.9  Date & Time Notied:  02/25/2018 @ 2317  Provider Notified: X. Blount  Orders Received/Actions taken: In & Out performed from earlier bladder scan (post void) of 396 ml.  Voided over 600 ml. Will continue to monitor

## 2018-02-27 NOTE — Progress Notes (Signed)
MD Linton Flemings aware of elevated troponin's 9.13 and lactic acid pending results. Will continue to monitor since trending down and place on Rapid responses Team list.

## 2018-02-27 NOTE — H&P (Addendum)
History and Physical    Adam [name] TWS:[id number] DOB: 04/10/1923 DOA: 02/11/2018  **Will admit patient based on the expectation that the patient will need hospitalization/ hospital care that crosses at least 2 midnights  PCP: Unk Pinto, MD   Attending physician: Jamse Arn  Patient coming from/Resides with: Private residence/wife  Chief Complaint: Shortness of breath, cough and rigors  HPI: Adam [name] is a 84 y.o. male with medical history significant for remote CAD with PCI to RCA in 1995, moderate aortic stenosis with associated LVH and diastolic dysfunction not on diuretics at home, stage IV chronic kidney disease, hard of hearing with hearing aids, dyslipidemia, hypertension, chronic atrial fibrillation on anticoagulation and amiodarone, hypothyroidism and diet-controlled diabetes.  Patient began developing shortness of breath, nonproductive cough and shaking chills on Wednesday.  He saw his PCP on Friday and was prescribed doxycycline.  In addition his INR was noted to be elevated on Friday and he was instructed to stop his Coumadin.  Symptoms have worsened.  EMS was called to the home.  Patient was reporting dizziness and upon arrival of EMS his O2 saturations were 70% on room air.  He was placed on CPAP.    Upon arrival to the ER patient was afebrile, non-tachycardic, normotensive with normal MAP.  Was placed on BiPAP with 50% FiO2 maintaining O2 saturations of 100%.  He has remained tachypneic and attempts to remove BiPAP have resulted in increased work of breathing.  X-ray revealed increasing perihilar interstitial and alveolar infiltrates suggestive of either multifocal pneumonia or edema.  He has been evaluated by cardiology in the context of troponin of 4 and elevated BNP greater than 1000.  They felt patient had acute CHF in context of return of atrial fibrillation and possible pneumonia as well.  He also has had reemergence of his atrial fibrillation where he  previously been maintaining sinus rhythm on amiodarone noting his amiodarone was stopped on 4/1 in context of low TSH.  In addition although patient was not having any chest pain his troponin was 4 and he did have ST segment changes therefore symptoms concerning for non-STEMI.  He also was found to have worsening of his renal failure from a baseline chronic kidney disease stage III with a baseline GFR of 28 with current GFR of 17.  EDP had started empiric broad-spectrum antibiotics for likely HCAP given recent hospitalization in February of this year.  Cardiology has given IV Lasix comes 1 dose.   ED Course:  Vital Signs: BP (!) 117/49   Pulse 87   Temp 97.6 F (36.4 C)   Resp (!) 27   SpO2 100%  CXR: As above Lab data: Sodium 133, potassium 4.8, chloride 99, CO2 9, glucose 229, BUN 66, creatinine 3.03, calcium 8.3, anion gap 25, BNP 1045, point-of-care troponin  4.13, white count 16,400 with neutrophils 83%, absolute neutrophils 13.6%, hemoglobin 9.7, MCV 102, platelets 235,000, PT 31.7, INR 3.09 Medications and treatments: Aspirin 324 mg x 1, Lasix 40 mg IV x1, Zosyn 3.375 g IV x1, vancomycin IV x1  Review of Systems:  In addition to the HPI above,  No Headache, changes with Vision or hearing, new weakness, tingling, numbness in any extremity, dizziness, dysarthria or word finding difficulty, gait disturbance or imbalance, tremors or seizure activity No problems swallowing food or Liquids, indigestion/reflux, choking or coughing while eating, abdominal pain with or after eating No Chest pain, palpitations, orthopnea or DOE No Abdominal pain, N/V, melena,hematochezia, dark tarry stools, constipation No dysuria, malodorous  urine, hematuria or flank pain No new skin rashes, lesions, masses or bruises, No new joint pains, aches, swelling or redness No recent unintentional weight gain or loss No polyuria, polydypsia or polyphagia   Past Medical History:  Diagnosis Date  . Atrial  fibrillation ([employer])   . CORONARY ARTERY DISEASE   . HYPERTENSION   . Hypothyroidism   . Leg pain    a. ABI (05/2014):  ABIs falsely elevated due to calcification; TBIs ok; dopplers without significant stenosis  . MURMUR   . NEPHROLITHIASIS, HX OF   . SINUS BRADYCARDIA   . VITAMIN B12 DEFICIENCY     Past Surgical History:  Procedure Laterality Date  . ANGIOPLASTY      Social History   Socioeconomic History  . Marital status: Married    Spouse name: Not on file  . Number of children: Not on file  . Years of education: Not on file  . Highest education level: Not on file  Occupational History  . Not on file  Social Needs  . Financial resource strain: Not on file  . Food insecurity:    Worry: Not on file    Inability: Not on file  . Transportation needs:    Medical: Not on file    Non-medical: Not on file  Tobacco Use  . Smoking status: Never Smoker  . Smokeless tobacco: Never Used  Substance and Sexual Activity  . Alcohol use: No  . Drug use: No  . Sexual activity: Not on file  Lifestyle  . Physical activity:    Days per week: Not on file    Minutes per session: Not on file  . Stress: Not on file  Relationships  . Social connections:    Talks on phone: Not on file    Gets together: Not on file    Attends religious service: Not on file    Active member of club or organization: Not on file    Attends meetings of clubs or organizations: Not on file    Relationship status: Not on file  . Intimate partner violence:    Fear of current or ex partner: Not on file    Emotionally abused: Not on file    Physically abused: Not on file    Forced sexual activity: Not on file  Other Topics Concern  . Not on file  Social History Narrative  . Not on file    Mobility: Independent Work history: Not obtained   No Known Allergies  Family History  Problem Relation Age of Onset  . Colon polyps Unknown   . Heart disease Unknown      Prior to Admission medications     Medication Sig Start Date End Date Taking? Authorizing Provider  albuterol (PROAIR HFA) 108 (90 Base) MCG/ACT inhaler Inhale 1 puff into the lungs every 6 (six) hours as needed for wheezing or shortness of breath. 02/25/18  Yes Vicie Mutters, PA-C  aspirin 81 MG tablet Take 81 mg by mouth daily.     Yes [provider]  atorvastatin (LIPITOR) 40 MG tablet Take 1 tablet (40 mg total) by mouth daily. 10/13/17  Yes Unk Pinto, MD  benzonatate (TESSALON PERLES) 100 MG capsule Take 1 capsule (100 mg total) by mouth 3 (three) times daily as needed for cough (Max: '600mg'$  per day). 02/25/18  Yes Vicie Mutters, PA-C  Cholecalciferol (VITAMIN D-3) 1000 UNITS CAPS Take 4,000 Units by mouth daily.   Yes [provider]  doxycycline (VIBRAMYCIN) 100 MG capsule  Take 1 capsule twice daily with food 02/25/18  Yes Vicie Mutters, PA-C  levothyroxine (SYNTHROID, LEVOTHROID) 88 MCG tablet Take 1 tablet (88 mcg total) by mouth daily before breakfast. Take 30-60 min before any other medications or food in the morning. 01/03/18  Yes Liane Comber, NP  warfarin (COUMADIN) 2.5 MG tablet Take as directed by the coumadin clinic. Please do not take coumadin today or tomorrow. Check INR at your coumadin clinic tomorrow .Continue holding it until the INR falls below 3. 12/31/17   Unk Pinto, MD    Physical Exam: Vitals:   02/15/2018 0630 02/11/2018 0700 02/02/2018 0739 02/11/2018 0800  BP: (!) 115/58 (!) 119/58 117/62 (!) 117/49  Pulse: 91 85  87  Resp: (!) 32 (!) 26  (!) 27  Temp:      SpO2: 100% 100% 100% 100%      Constitutional: NAD, calm, comfortable on BiPAP Eyes: PERRL, lids and conjunctivae normal ENMT: Mucous membranes are dry.  Unable to adequately assess posterior pharynx in the context of BiPAP mask.  Bilateral hearing aid Neck: normal, supple, no masses, no thyromegaly Respiratory: Diffuse coarse lung sounds with scattered expiratory crackles in the left base.  BiPAP 50% FiO2.  To  knees with tachypnea but no increased work of breathing at present. Cardiovascular: Irregular rhythm/fibrillation rate controlled, no murmurs / rubs / gallops. No extremity edema. 2+ pedal pulses. No carotid bruits.  Abdomen: no tenderness, no masses palpated. No hepatosplenomegaly. Bowel sounds positive.  Musculoskeletal: no clubbing / cyanosis. No joint deformity upper and lower extremities. Good ROM, no contractures. Normal muscle tone.  No pain with palpation or movement of left arm (patient reports fell on left shoulder earlier in week) Skin: no rashes, lesions, ulcers. No induration.  Purple ecchymotic area mid back (patient with fall earlier in the week) Neurologic: CN 2-12 grossly intact. Sensation intact, DTR normal. Strength 5/5 x all 4 extremities.  Unsteady gait noted with patient transferring from bedside commode to bed and required nurse assistance. Psychiatric: Alert and oriented x name and place.  Confused about recent history stating he did not have any fevers or chills when wife confirmed patient had been having fevers and chills and had recently been started on antibiotics.  Patient was able to tell me that his Coumadin was held on Friday because of elevated INR.  Normal mood.    Labs on Admission: I have personally reviewed following labs and imaging studies  CBC: Recent Labs  Lab 02/16/2018 0607  WBC 16.4*  NEUTROABS 13.6*  HGB 9.7*  HCT 31.7*  MCV 101.9*  PLT 174   Basic Metabolic Panel: Recent Labs  Lab 02/24/2018 0607  NA 133*  K 4.8  CL 99*  CO2 9*  GLUCOSE 229*  BUN 66*  CREATININE 3.03*  CALCIUM 8.3*   GFR: Estimated Creatinine Clearance: 15.3 mL/min (A) (by C-G formula based on SCr of 3.03 mg/dL (H)). Liver Function Tests: No results for input(s): AST, ALT, ALKPHOS, BILITOT, PROT, ALBUMIN in the last 168 hours. No results for input(s): LIPASE, AMYLASE in the last 168 hours. No results for input(s): AMMONIA in the last 168 hours. Coagulation  Profile: Recent Labs  Lab 02/22/18 1151 02/13/2018 0607  INR 3.2 3.09   Cardiac Enzymes: No results for input(s): CKTOTAL, CKMB, CKMBINDEX, TROPONINI in the last 168 hours. BNP (last 3 results) No results for input(s): PROBNP in the last 8760 hours. HbA1C: No results for input(s): HGBA1C in the last 72 hours. CBG: No results for input(s): GLUCAP  in the last 168 hours. Lipid Profile: No results for input(s): CHOL, HDL, LDLCALC, TRIG, CHOLHDL, LDLDIRECT in the last 72 hours. Thyroid Function Tests: No results for input(s): TSH, T4TOTAL, FREET4, T3FREE, THYROIDAB in the last 72 hours. Anemia Panel: No results for input(s): VITAMINB12, FOLATE, FERRITIN, TIBC, IRON, RETICCTPCT in the last 72 hours. Urine analysis:    Component Value Date/Time   COLORURINE YELLOW 10/29/2017 1115   APPEARANCEUR CLEAR 10/29/2017 1115   LABSPEC 1.017 10/29/2017 1115   PHURINE < OR = 5.0 10/29/2017 1115   GLUCOSEU TRACE (A) 10/29/2017 1115   HGBUR NEGATIVE 10/29/2017 1115   BILIRUBINUR NEGATIVE 10/01/2016 1142   KETONESUR NEGATIVE 10/29/2017 1115   PROTEINUR NEGATIVE 10/29/2017 1115   NITRITE NEGATIVE 10/29/2017 1115   LEUKOCYTESUR NEGATIVE 10/29/2017 1115   Sepsis Labs: '@LABRCNTIP'$ (procalcitonin:4,lacticidven:4) )No results found for this or any previous visit (from the past 240 hour(s)).   Radiological Exams on Admission: Dg Chest 2 View  Result Date: 02/25/2018 CLINICAL DATA:  Cough, shortness of breath, hemoptysis, recent pneumonia EXAM: CHEST - 2 VIEW COMPARISON:  02/09/2018 FINDINGS: Upper normal size of cardiac silhouette. Atherosclerotic calcification aorta. Mediastinal contours normal. Patchy BILATERAL airspace infiltrates which could represent pneumonia or LV lower hemorrhage in the setting of mild mop doses. Underlying chronic bronchitic changes. No pleural effusion or pneumothorax. Bones demineralized. IMPRESSION: Chronic bronchitic changes with superimposed BILATERAL patchy airspace  infiltrates which could represent pneumonia or alveolar hemorrhage. Electronically Signed   By: Lavonia Dana M.D.   On: 02/25/2018 12:47   Dg Chest Port 1 View  Result Date: 02/19/2018 CLINICAL DATA:  Shortness of breath EXAM: PORTABLE CHEST 1 VIEW COMPARISON:  02/25/2018 FINDINGS: Shallow inspiration. Cardiac enlargement. Perihilar interstitial and alveolar infiltrates progressing since previous study. This suggests developing multifocal pneumonia or edema. Underlying chronic bronchitic changes. No blunting of costophrenic angles. No pneumothorax. Mediastinal contours appear intact. Calcification of the aorta. IMPRESSION: Increasing perihilar interstitial and alveolar infiltrates suggest developing multifocal pneumonia or edema. Underlying chronic bronchitic changes. Aortic atherosclerosis. Electronically Signed   By: Lucienne Capers M.D.   On: 02/02/2018 06:59    EKG: (Independently reviewed) patient with underlying nonspecific IVCD, ventricular rate 103 bpm QTC elevated at 504 ms, normal R wave progression although low voltage R waves in V1 through V3, sagging ST segments in lead I to aVF as well as in V4 through V6.  Changes compared to EKG from February 2019.  Assessment/Plan Principal Problem:   Acute respiratory failure with hypoxia 2/2 Sepsis due to pneumonia  -Presents with cough, chills with abnormal chest x-ray concerning for bilateral pneumonia +/-acute heart failure.  Patient was started on doxycycline on Friday by PCP. -Sepsis physiology as follows: Leukocytosis greater than 12,000, acute kidney injury, source of infection identified, acute respiratory failure with persistent tachypnea   **serum lactate obtained after admission was elevated at 4.87 -O2 sat 70% on room air in the field and respiratory status has improved on BiPAP although unable to tolerate initial attempt at weaning -Trend lactic acid (4.87) and pro calcitonin, ESR -Aloe up on blood cultures; obtain sputum culture,  urinary strep, influenza PCR and respiratory viral panel -Treat as HCAP and recent pneumonia admission in February (given AKI will discontinue Zosyn in favor of cefepime but continue vancomycin IV) -Continue supportive care with BiPAP and wean to nasal cannula oxygen as tolerated -Incentive spirometry -Noncontrasted CT of the chest better clarify -Hemodynamically stable at this juncture -PSI/PORT Score: Pneumonia Severity Index for Adult CAP Patient age: 84 Years Male: No (  0 points) Nursing home resident: No (0 points) Neoplastic disease history: No (0 points) Liver disease: No (0 points) Congestive heart failure: Yes (10 points) Cerebrovascular disease: No (0 points) Renal disease: Yes (10 points) Altered mental status: Yes (20 points) Respiratory rate over 29: Yes (20 points) Systolic blood pressure below 90: No (0 points) Temp below 35.0 deg C (95 deg F) or over 39.9 deg C (103.8 deg F): No (0 points) Pulse over 124: No (0 points) pH below 7.35: No (0 points) BUN over 29: Yes (20 points) Sodium below 130: No (0 points) Glucose over 249 ([place]) or over 13.8 (SI): No (0 points) Hematocrit below 30%: No (0 points) Partial pressure of oxygen below 60: Yes (10 points) Pleural effusion on x-ray: No (0 points)  Score: 182 points. Risk class V, >>2.8% mortality.  Active Problems:   Acute kidney injury superimposed on chronic kidney disease  -Baseline creatinine 1.96 with GFR 28 -Current creatinine 3.03 with a GFR of 17 -Patient has an anion gap acidemia with moderate hyperglycemia which is not consistent with DKA and more consistent with acute kidney injury and sepsis physiology -Avoid nephrotoxic medication -Potentially heart failure contributing although at baseline patient has normal LVEF in this less likely -Nephrology consulted by EDP -Follow labs    Leukocytosis -Likely from sepsis/pneumonia as well as hypoperfusion in the context of hypoxemia prior to arrival -For  completeness of evaluation obtain urinalysis and culture    Acute on chronic diastolic heart failure/moderate aortic stenosis -Patient presents with several days of respiratory symptoms felt to be secondary to pneumonia -BNP elevated at greater than 1000 in the context of acute kidney injury on CKD 4 as well as NSTEMI -Has been given 1 dose of Lasix-blood pressure somewhat suboptimal continue to monitor response to recent dose of Lasix-defer additional doses to cardiology who is following -Daily weights, strict I's/O -Echocardiogram completed April 2019: Preserved LVEF exceeded 65%, moderate LVH, moderate aortic stenosis with mild regurgitation, severely calcified mitral valve -Not on beta-blocker, ACEI/ARB or diuretics prior to admission    NSTEMI (non-ST elevated myocardial infarction) -Known CAD with PCI to RCA in 1995 -Cardiac catheterization was in 2000 with a 90% PDA lesion identified with medical treatment/[facility] primary cardiologist -poc troponin 4.13-continue to cycle -Has ST segment changes in the inferoateral leads -Cardiology consulted and has evaluated patient -Beta-blocker not initiated in the context of need to re-resume amiodarone for reemergence of atrial fibrillation -Given full dose aspirin in ER -Statin on hold while NPO -On full dose anticoagulation prior to admission and arrived with supratherapeutic INR    Essential hypertension -Blood pressure suboptimal in the context of sepsis physiology and NSTEMI -Of note was not on antihypertensive medications prior to arrival    Atrial fibrillation  -Apparently had been maintaining sinus rhythm on amiodarone; unfortunately this medication was discontinued earlier in the month due to low TSH and now patient has had reemergence of atrial fibrillation which is currently rate controlled -Cardiology resuming amiodarone -CHA2DS2-VASc=5  -Current warfarin on hold in context of supratherapeutic INR-pharmacy to initiate IV heparin  once INR less than 2.0 if patient unable to be weaned from BiPAP quickly    Type 2 diabetes mellitus, uncontrolled  -Not on medications and apparently diet controlled prior to admission -Presents with moderate hyperglycemia greater than 200 with associated mild pseudohyponatremia -Obtain HgbA1c -CBGs every 4 hours and provide SSI    Hyperlipidemia -Preadmission statin on hold -Lipid panel 02/08/2018 cholesterol 140, HDL 40, LDL 76, triglycerides 141  Hypothyroidism -Continue Synthroid IV -TSH 01/03/2018 was 1.38 with follow-up TSH on 02/08/2018 was 0.42 prompting discontinuation of amiodarone -Repeat TSH, free T4    Warfarin-induced coagulopathy  -Warfarin on hold -Pharmacy assisting with management of chronic anticoagulation    **Additional lab, imaging and/or diagnostic evaluation at discretion of supervising physician  DVT prophylaxis: Supratherapeutic INR on warfarin prior to admission-his orders in place for IV heparin with pharmacy managing Code Status: DO NOT RESUSCITATE Family Communication: Son, wife Disposition Plan: Home Consults called: Cardiology/[place]; Nephrology/on-call physician directed by EDP    Adam [name] L. ANP-BC [facility] Pager [phone]   If 7PM-7AM, please contact night-coverage [email or web address] Password [facility]  02/23/2018, 9:57 AM

## 2018-02-27 NOTE — Consult Note (Addendum)
[facility]rology Consultation Note  Requesting MD: Dr. Jamse Arn and EDP Reason for consult: Worsening renal failure  HPI:  Adam [name] is a 84 y.o. male.  History of atrial fibrillation, type 2 diabetes, hypertension, CKD stage IV with baseline serum creatinine level around 2, recently started on antibiotics for the treatment of pneumonia by PCP brought in by patient's son and wife for the respiratory distress associated with chills and coughing.  On reviewing record, patient has elevated serum creatinine level at least since 2009.  In the ER patient was found to have hypoxia therefore placed on BiPAP.  Chest x-ray with possible multifocal pneumonia versus edema.  He was found to have positive troponin of 4, lactic acid level elevated associated with worsening creatinine level of 3.  Patient was treated with IV Lasix 402 for the management of CHF.  He has elevated BNP of 1000.  Also with atrial fibrillation. Patient reported that he has a history of nephritis about 35 years ago.  He denies dysuria, urgency or frequency.  He is currently on BiPAP.  Reports cough, shortness of breath.  No chest pain. Denied use of NSAIDs or herbal medication. Patient's wife and son at bedside.  Creat  Date/Time Value Ref Range Status  02/08/2018 05:03 PM 2.63 (H) 0.70 - 1.11 mg/dL Final    Comment:    For patients >56 years of age, the reference limit for Creatinine is approximately 13% higher for people identified as African-American. Marland Kitchen   01/03/2018 11:49 AM 2.03 (H) 0.70 - 1.11 mg/dL Final    Comment:    For patients >51 years of age, the reference limit for Creatinine is approximately 13% higher for people identified as African-American. .   12/15/2017 11:03 AM 2.19 (H) 0.70 - 1.11 mg/dL Final    Comment:    For patients >42 years of age, the reference limit for Creatinine is approximately 13% higher for people identified as African-American. .   10/29/2017 11:15 AM  2.20 (H) 0.70 - 1.11 mg/dL Final    Comment:    For patients >80 years of age, the reference limit for Creatinine is approximately 13% higher for people identified as African-American. Marland Kitchen   05/21/2017 09:53 AM 1.98 (H) 0.70 - 1.11 mg/dL Final    Comment:      For patients > or = 82 years of age: The upper reference limit for Creatinine is approximately 13% higher for people identified as African-American.     05/21/2017 09:53 AM 1.93 (H) 0.70 - 1.11 mg/dL Final    Comment:      For patients > or = 82 years of age: The upper reference limit for Creatinine is approximately 13% higher for people identified as African-American.     04/16/2017 11:20 AM 2.22 (H) 0.70 - 1.11 mg/dL Final    Comment:      For patients > or = 82 years of age: The upper reference limit for Creatinine is approximately 13% higher for people identified as African-American.     01/07/2017 11:18 AM 1.74 (H) 0.70 - 1.11 mg/dL Final    Comment:      For patients > or = 82 years of age: The upper reference limit for Creatinine is approximately 13% higher for people identified as African-American.     10/01/2016 11:42 AM 1.77 (H) 0.70 - 1.11 mg/dL Final    Comment:      For patients > or = 82 years of age: The upper reference limit for Creatinine  is approximately 13% higher for people identified as African-American.     06/09/2016 11:59 AM 1.90 (H) 0.70 - 1.11 mg/dL Final    Comment:      For patients > or = 82 years of age: The upper reference limit for Creatinine is approximately 13% higher for people identified as African-American.     12/03/2015 12:31 PM 1.72 (H) 0.70 - 1.11 mg/dL Final  05/28/2015 05:38 PM 2.92 (H) 0.70 - 1.11 mg/dL Final  03/01/2015 10:40 AM 1.75 (H) 0.50 - 1.35 mg/dL Final  01/29/2015 12:26 PM 2.07 (H) 0.50 - 1.35 mg/dL Final  10/23/2014 12:49 PM 1.71 (H) 0.50 - 1.35 mg/dL Final  05/10/2014 01:03 PM 1.65 (H) 0.50 - 1.35 mg/dL Final  04/24/2014 11:44 AM 1.79 (H) 0.50 -  1.35 mg/dL Final  04/17/2014 12:23 PM 2.02 (H) 0.50 - 1.35 mg/dL Final  04/10/2014 11:59 AM 1.69 (H) 0.50 - 1.35 mg/dL Final  10/17/2013 11:14 AM 1.71 (H) 0.50 - 1.35 mg/dL Final   Creatinine, Ser  Date/Time Value Ref Range Status  02/13/2018 06:07 AM 3.03 (H) 0.61 - 1.24 mg/dL Final  12/29/2017 04:19 AM 1.96 (H) 0.61 - 1.24 mg/dL Final  12/28/2017 04:15 AM 2.01 (H) 0.61 - 1.24 mg/dL Final  12/27/2017 07:01 PM 2.26 (H) 0.61 - 1.24 mg/dL Final  09/09/2011 10:56 PM 1.65 (H) 0.50 - 1.35 mg/dL Final  11/25/2009 01:18 PM 1.3 0.4 - 1.5 mg/dL Final  11/13/2008 09:52 AM 1.4 0.4 - 1.5 mg/dL Final  12/26/2007 08:54 AM 1.3 0.4 - 1.5 mg/dL Final  11/11/2007 10:20 AM 1.4 0.4 - 1.5 mg/dL Final     PMHx:   Past Medical History:  Diagnosis Date  . Atrial fibrillation ([place])   . CORONARY ARTERY DISEASE   . HYPERTENSION   . Hypothyroidism   . Leg pain    a. ABI (05/2014):  ABIs falsely elevated due to calcification; TBIs ok; dopplers without significant stenosis  . MURMUR   . NEPHROLITHIASIS, HX OF   . SINUS BRADYCARDIA   . VITAMIN B12 DEFICIENCY     Past Surgical History:  Procedure Laterality Date  . ANGIOPLASTY      Family Hx:  Family History  Problem Relation Age of Onset  . Colon polyps Unknown   . Heart disease Unknown     Social History:  reports that he has never smoked. He has never used smokeless tobacco. He reports that he does not drink alcohol or use drugs.  Allergies: No Known Allergies  Medications: Prior to Admission medications   Medication Sig Start Date End Date Taking? Authorizing Provider  albuterol (PROAIR HFA) 108 (90 Base) MCG/ACT inhaler Inhale 1 puff into the lungs every 6 (six) hours as needed for wheezing or shortness of breath. 02/25/18  Yes Vicie Mutters, PA-C  aspirin 81 MG tablet Take 81 mg by mouth daily.     Yes [provider]  atorvastatin (LIPITOR) 40 MG tablet Take 1 tablet (40 mg total) by mouth daily. 10/13/17  Yes Unk Pinto, MD  benzonatate (TESSALON PERLES) 100 MG capsule Take 1 capsule (100 mg total) by mouth 3 (three) times daily as needed for cough (Max: '600mg'$  per day). 02/25/18  Yes Vicie Mutters, PA-C  Cholecalciferol (VITAMIN D-3) 1000 UNITS CAPS Take 4,000 Units by mouth daily.   Yes [provider]  doxycycline (VIBRAMYCIN) 100 MG capsule Take 1 capsule twice daily with food 02/25/18  Yes Vicie Mutters, PA-C  levothyroxine (SYNTHROID, LEVOTHROID) 88 MCG tablet Take 1 tablet (88  mcg total) by mouth daily before breakfast. Take 30-60 min before any other medications or food in the morning. 01/03/18  Yes Liane Comber, NP  warfarin (COUMADIN) 2.5 MG tablet Take as directed by the coumadin clinic. Please do not take coumadin today or tomorrow. Check INR at your coumadin clinic tomorrow .Continue holding it until the INR falls below 3. 12/31/17   Unk Pinto, MD    I have reviewed the patient's current medications.  Labs:  Results for orders placed or performed during the hospital encounter of 02/19/2018 (from the past 48 hour(s))  Basic metabolic panel     Status: Abnormal   Collection Time: 02/18/2018  6:07 AM  Result Value Ref Range   Sodium 133 (L) 135 - 145 mmol/L   Potassium 4.8 3.5 - 5.1 mmol/L   Chloride 99 (L) 101 - 111 mmol/L   CO2 9 (L) 22 - 32 mmol/L   Glucose, Bld 229 (H) 65 - 99 mg/dL   BUN 66 (H) 6 - 20 mg/dL   Creatinine, Ser 3.03 (H) 0.61 - 1.24 mg/dL   Calcium 8.3 (L) 8.9 - 10.3 mg/dL   GFR calc non Af Amer 17 (L) >60 mL/min   GFR calc Af Amer 19 (L) >60 mL/min    Comment: (NOTE) The eGFR has been calculated using the CKD EPI equation. This calculation has not been validated in all clinical situations. eGFR's persistently <60 mL/min signify possible Chronic Kidney Disease.    Anion gap 25 (H) 5 - 15    Comment: Performed at [facility], [place] [address], [place], [place] [address]  CBC with Differential     Status: Abnormal   Collection Time: 02/02/2018  6:07  AM  Result Value Ref Range   WBC 16.4 (H) 4.0 - 10.5 K/uL   RBC 3.11 (L) 4.22 - 5.81 MIL/uL   Hemoglobin 9.7 (L) 13.0 - 17.0 g/dL   HCT 31.7 (L) 39.0 - 52.0 %   MCV 101.9 (H) 78.0 - 100.0 fL   MCH 31.2 26.0 - 34.0 pg   MCHC 30.6 30.0 - 36.0 g/dL   RDW 15.1 11.5 - 15.5 %   Platelets 235 150 - 400 K/uL   Neutrophils Relative % 83 %   Lymphocytes Relative 8 %   Monocytes Relative 9 %   Eosinophils Relative 0 %   Basophils Relative 0 %   Neutro Abs 13.6 (H) 1.7 - 7.7 K/uL   Lymphs Abs 1.3 0.7 - 4.0 K/uL   Monocytes Absolute 1.5 (H) 0.1 - 1.0 K/uL   Eosinophils Absolute 0.0 0.0 - 0.7 K/uL   Basophils Absolute 0.0 0.0 - 0.1 K/uL   RBC Morphology POLYCHROMASIA PRESENT     Comment: Performed at [facility], [address] [place], [place] [address]  Brain natriuretic peptide     Status: Abnormal   Collection Time: 02/03/2018  6:07 AM  Result Value Ref Range   B Natriuretic Peptide 1,044.6 (H) 0.0 - 100.0 pg/mL    Comment: Performed at [facility] [address], [place], [place] [address]  Protime-INR     Status: Abnormal   Collection Time: 02/16/2018  6:07 AM  Result Value Ref Range   Prothrombin Time 31.7 (H) 11.4 - 15.2 seconds   INR 3.09     Comment: Performed at [facility] [address] [place], [place] [address]  I-stat troponin, ED     Status: Abnormal   Collection Time: 02/07/2018  6:12  AM  Result Value Ref Range   Troponin i, poc 4.13 (HH) 0.00 - 0.08 ng/mL   Comment NOTIFIED PHYSICIAN    Comment 3            Comment: Due to the release kinetics of cTnI, a negative result within the first hours of the onset of symptoms does not rule out myocardial infarction with certainty. If myocardial infarction is still suspected, repeat the test at appropriate intervals.   T4, free     Status: Abnormal   Collection Time: 02/11/2018  9:35 AM  Result Value Ref Range   Free T4 2.05 (H) 0.82 - 1.77 ng/dL    Comment: (NOTE) Biotin ingestion may interfere with  free T4 tests. If the results are inconsistent with the TSH level, previous test results, or the clinical presentation, then consider biotin interference. If needed, order repeat testing after stopping biotin. Performed at [facility], [place] [address], [place], [place] [address]   Troponin I     Status: Abnormal   Collection Time: 02/12/2018  9:35 AM  Result Value Ref Range   Troponin I 6.35 (HH) <0.03 ng/mL    Comment: CRITICAL RESULT CALLED TO, READ BACK BY AND VERIFIED WITH: RN Cleta Alberts AT [phone] MARTINB Performed at [facility] [address], [place], [place] [address]   Procalcitonin - Baseline     Status: None   Collection Time: 02/06/2018  9:35 AM  Result Value Ref Range   Procalcitonin 1.13 ng/mL    Comment:        Interpretation: PCT > 0.5 ng/mL and <= 2 ng/mL: Systemic infection (sepsis) is possible, but other conditions are known to elevate PCT as well. (NOTE)       Sepsis PCT Algorithm           Lower Respiratory Tract                                      Infection PCT Algorithm    ----------------------------     ----------------------------         PCT < 0.25 ng/mL                PCT < 0.10 ng/mL         Strongly encourage             Strongly discourage   discontinuation of antibiotics    initiation of antibiotics    ----------------------------     -----------------------------       PCT 0.25 - 0.50 ng/mL            PCT 0.10 - 0.25 ng/mL               OR       >80% decrease in PCT            Discourage initiation of                                            antibiotics      Encourage discontinuation           of antibiotics    ----------------------------     -----------------------------         PCT >= 0.50 ng/mL  PCT 0.26 - 0.50 ng/mL                AND       <80% decrease in PCT             Encourage initiation of                                             antibiotics       Encourage continuation           of  antibiotics    ----------------------------     -----------------------------        PCT >= 0.50 ng/mL                  PCT > 0.50 ng/mL               AND         increase in PCT                  Strongly encourage                                      initiation of antibiotics    Strongly encourage escalation           of antibiotics                                     -----------------------------                                           PCT <= 0.25 ng/mL                                                 OR                                        > 80% decrease in PCT                                     Discontinue / Do not initiate                                             antibiotics Performed at [facility], [address] [place], [place] [address]   Sedimentation rate     Status: Abnormal   Collection Time: 02/08/2018  9:35 AM  Result Value Ref Range   Sed Rate >140 (H) 0 - 16 mm/hr    Comment: Performed at [facility] [address] [place], [place] [address]  Hemoglobin A1c     Status: Abnormal   Collection Time: 02/18/2018  9:35 AM  Result  Value Ref Range   Hgb A1c MFr Bld 6.9 (H) 4.8 - 5.6 %    Comment: (NOTE) Pre diabetes:          5.7%-6.4% Diabetes:              >6.4% Glycemic control for   <7.0% adults with diabetes    Mean Plasma Glucose 151.33 mg/dL    Comment: Performed at [facility] [address], [place], [place] [address]  Lactic acid, plasma     Status: Abnormal   Collection Time: 02/01/2018  9:35 AM  Result Value Ref Range   Lactic Acid, Venous 5.8 (HH) 0.5 - 1.9 mmol/L    Comment: CRITICAL RESULT CALLED TO, READ BACK BY AND VERIFIED WITH: RN Lenna Sciara BLUE AT [phone] MARTINB Performed at [facility], [address], [place], [place] [address]   I-Stat CG4 Lactic Acid, ED     Status: Abnormal   Collection Time: 02/22/2018 10:04 AM  Result Value Ref Range   Lactic Acid, Venous 4.87 (HH) 0.5 - 1.9 mmol/L   Comment NOTIFIED  PHYSICIAN   POC CBG, ED     Status: Abnormal   Collection Time: 02/25/2018 12:51 PM  Result Value Ref Range   Glucose-Capillary 211 (H) 65 - 99 mg/dL     ROS:  Pertinent items noted in HPI and remainder of comprehensive ROS otherwise negative.  Physical Exam: Vitals:   02/06/2018 1218 02/09/2018 1300  BP:  120/72  Pulse:  82  Resp:  (!) 25  Temp:    SpO2: 94% 100%     General exam: Appears calm and comfortable  Respiratory system: Clear to auscultation. Respiratory effort normal. No wheezing or crackle Cardiovascular system: S1 & S2 heard, RRR.  No pedal edema. Gastrointestinal system: Abdomen is nondistended, soft and nontender. Normal bowel sounds heard. Central nervous system: Alert and oriented. No focal neurological deficits. Extremities: Symmetric 5 x 5 power. Skin: No rashes, lesions or ulcers Psychiatry: Judgement and insight appear normal. Mood & affect appropriate.   Assessment/Plan:  #Acute on chronic kidney disease stage IV likely hemodynamically medicated  in the setting of sepsis, CHF and NSTEMI: -Patient has baseline serum creatinine level of 2, CKD for at least 10 years likely glomerulosclerosis in the setting of hypertension and diabetes.  Patient reported history of nephritis about 35 years ago, details unknown.  He denies any urinary symptoms currently. -I will check urinalysis, bladder scan and kidney ultrasound. -He does not look grossly volume overload, already received Lasix 40 mg x 2.  Monitor urine output, avoid nephrotoxins.  Hold further Lasix for today, repeat BMP in the morning.  #Lactic acidosis in the setting of sepsis: Patient with elevated lactic acid level of 5.8.  Currently on antibiotics.  Trend lactic acid level.  Low CO2 level also compounded by renal failure.  #Pneumonia: Currently on vancomycin  and cefepime per primary team. Vancomycin dosing  per GFR  # NSTEMI/CHF; chest x-ray with possible edema versus pneumonia.  Elevated BNP level.   Already received IV Lasix.  Already evaluated by cardiology.  Plan discussed with the patient, his wife and son at bedside. Thank you very much for this consult.  We will continue to follow with you.  Dron Tanna Furry 02/01/2018, 1:28 PM  [employer].

## 2018-02-27 NOTE — ED Provider Notes (Signed)
Assumed care at 7 AM.  Briefly, the patient is a 84 year old male with history of hypertension, CAD, A. fib, CKD, here with respiratory distress.  Imaging concerning for interstitial edema, CHF, and possible multifocal pneumonia.  On my review of records, the patient has a marketed leukocytosis as well as acute on chronic kidney disease with a bicarbonate of 9 and anion gap of 25.  Given his recent pneumonia, will cover him empirically in addition to the treatment for CHF which she is Artie been given by Dr. Preston Fleeting.  Cardiology is consulted and is requesting medicine admission.  Given his profound acidemia, will consult nephrology as well.  The patient appears clinically well on my exam, but is significantly hypoxic anytime he removes his BiPAP.  Will likely need stepdown.  D/w Dr. Ronalee Belts as well, who will see pt. Pt remains stable on BiPAP. I've added on BCx, lactate, and IV ABX.   CRITICAL CARE Performed by: Dollene Cleveland   Total critical care time: 35 minutes  Critical care time was exclusive of separately billable procedures and treating other patients.  Critical care was necessary to treat or prevent imminent or life-threatening deterioration.  Critical care was time spent personally by me on the following activities: development of treatment plan with patient and/or surrogate as well as nursing, discussions with consultants, evaluation of patient's response to treatment, examination of patient, obtaining history from patient or surrogate, ordering and performing treatments and interventions, ordering and review of laboratory studies, ordering and review of radiographic studies, pulse oximetry and re-evaluation of patient's condition.    Adam Pollack, MD 02/25/2018 0930

## 2018-02-27 NOTE — ED Notes (Signed)
Pt up to bedside commode having BM. Tolerating well.

## 2018-02-28 ENCOUNTER — Inpatient Hospital Stay (HOSPITAL_COMMUNITY): Payer: Medicare Other

## 2018-02-28 ENCOUNTER — Encounter (HOSPITAL_COMMUNITY): Admission: EM | Disposition: E | Payer: Self-pay | Source: Home / Self Care | Attending: Family Medicine

## 2018-02-28 DIAGNOSIS — I214 Non-ST elevation (NSTEMI) myocardial infarction: Secondary | ICD-10-CM

## 2018-02-28 DIAGNOSIS — I5033 Acute on chronic diastolic (congestive) heart failure: Secondary | ICD-10-CM

## 2018-02-28 LAB — CBC WITH DIFFERENTIAL/PLATELET
BASOS PCT: 0.3 %
Basophils Absolute: 29 cells/uL (ref 0–200)
Basophils Absolute: 24 cells/uL (ref 0–200)
Basophils Relative: 0.2 %
EOS PCT: 0 %
Eosinophils Absolute: 10 cells/uL — ABNORMAL LOW (ref 15–500)
Eosinophils Absolute: 0 cells/uL — ABNORMAL LOW (ref 15–500)
Eosinophils Relative: 0.1 %
HCT: 27.9 % — ABNORMAL LOW (ref 38.5–50.0)
HEMATOCRIT: 28 % — AB (ref 38.5–50.0)
HEMOGLOBIN: 9.4 g/dL — AB (ref 13.2–17.1)
Hemoglobin: 9.4 g/dL — ABNORMAL LOW (ref 13.2–17.1)
LYMPHS ABS: 1004 {cells}/uL (ref 850–3900)
Lymphs Abs: 998 cells/uL (ref 850–3900)
MCH: 31.9 pg (ref 27.0–33.0)
MCH: 32.2 pg (ref 27.0–33.0)
MCHC: 33.7 g/dL (ref 32.0–36.0)
MCHC: 33.6 g/dL (ref 32.0–36.0)
MCV: 94.6 fL (ref 80.0–100.0)
MCV: 95.9 fL (ref 80.0–100.0)
MONOS PCT: 10.3 %
MPV: 10.1 fL (ref 7.5–12.5)
MPV: 10 fL (ref 7.5–12.5)
Monocytes Relative: 10.3 %
NEUTROS ABS: 9825 {cells}/uL — AB (ref 1500–7800)
Neutro Abs: 7486 cells/uL (ref 1500–7800)
Neutrophils Relative %: 78.8 %
Neutrophils Relative %: 81.2 %
PLATELETS: 222 10*3/uL (ref 140–400)
PLATELETS: 218 10*3/uL (ref 140–400)
RBC: 2.95 10*6/uL — ABNORMAL LOW (ref 4.20–5.80)
RBC: 2.92 10*6/uL — AB (ref 4.20–5.80)
RDW: 13.3 % (ref 11.0–15.0)
RDW: 13.4 % (ref 11.0–15.0)
TOTAL LYMPHOCYTE: 10.5 %
Total Lymphocyte: 8.3 %
WBC mixed population: 1246 cells/uL — ABNORMAL HIGH (ref 200–950)
WBC: 9.5 10*3/uL (ref 3.8–10.8)
WBC: 12.1 10*3/uL — AB (ref 3.8–10.8)
WBCMIX: 979 {cells}/uL — AB (ref 200–950)

## 2018-02-28 LAB — CBC
HEMATOCRIT: 28.6 % — AB (ref 39.0–52.0)
HEMOGLOBIN: 9.3 g/dL — AB (ref 13.0–17.0)
MCH: 31.4 pg (ref 26.0–34.0)
MCHC: 32.5 g/dL (ref 30.0–36.0)
MCV: 96.6 fL (ref 78.0–100.0)
Platelets: 188 10*3/uL (ref 150–400)
RBC: 2.96 MIL/uL — ABNORMAL LOW (ref 4.22–5.81)
RDW: 14.7 % (ref 11.5–15.5)
WBC: 13.8 10*3/uL — AB (ref 4.0–10.5)

## 2018-02-28 LAB — TSH
TSH: 0.217 u[IU]/mL — ABNORMAL LOW (ref 0.350–4.500)
TSH: 0.27 m[IU]/L — AB (ref 0.40–4.50)

## 2018-02-28 LAB — BASIC METABOLIC PANEL WITH GFR
BUN/Creatinine Ratio: 18 (calc) (ref 6–22)
BUN: 37 mg/dL — ABNORMAL HIGH (ref 7–25)
CO2: 23 mmol/L (ref 20–32)
CREATININE: 2.06 mg/dL — AB (ref 0.70–1.11)
Calcium: 8.5 mg/dL — ABNORMAL LOW (ref 8.6–10.3)
Chloride: 103 mmol/L (ref 98–110)
GFR, Est African American: 31 mL/min/{1.73_m2} — ABNORMAL LOW (ref >=60)
GFR, Est Non African American: 27 mL/min/{1.73_m2} — ABNORMAL LOW (ref >=60)
GLUCOSE: 196 mg/dL — AB (ref 65–99)
Potassium: 4.3 mmol/L (ref 3.5–5.3)
SODIUM: 136 mmol/L (ref 135–146)

## 2018-02-28 LAB — COMPLETE METABOLIC PANEL WITH GFR
AG RATIO: 1 (calc) (ref 1.0–2.5)
ALKALINE PHOSPHATASE (APISO): 117 U/L — AB (ref 40–115)
ALT: 11 U/L (ref 9–46)
AST: 15 U/L (ref 10–35)
Albumin: 3.4 g/dL — ABNORMAL LOW (ref 3.6–5.1)
BILIRUBIN TOTAL: 1.7 mg/dL — AB (ref 0.2–1.2)
BUN/Creatinine Ratio: 18 (calc) (ref 6–22)
BUN: 39 mg/dL — ABNORMAL HIGH (ref 7–25)
CHLORIDE: 103 mmol/L (ref 98–110)
CO2: 19 mmol/L — ABNORMAL LOW (ref 20–32)
Calcium: 8.5 mg/dL — ABNORMAL LOW (ref 8.6–10.3)
Creat: 2.2 mg/dL — ABNORMAL HIGH (ref 0.70–1.11)
GFR, Est African American: 29 mL/min/{1.73_m2} — ABNORMAL LOW (ref >=60)
GFR, Est Non African American: 25 mL/min/{1.73_m2} — ABNORMAL LOW (ref >=60)
GLUCOSE: 285 mg/dL — AB (ref 65–99)
Globulin: 3.4 g/dL (calc) (ref 1.9–3.7)
POTASSIUM: 5.7 mmol/L — AB (ref 3.5–5.3)
Sodium: 137 mmol/L (ref 135–146)
Total Protein: 6.8 g/dL (ref 6.1–8.1)

## 2018-02-28 LAB — RESPIRATORY PANEL BY PCR
Adenovirus: NOT DETECTED
BORDETELLA PERTUSSIS-RVPCR: NOT DETECTED
CHLAMYDOPHILA PNEUMONIAE-RVPPCR: NOT DETECTED
CORONAVIRUS 229E-RVPPCR: NOT DETECTED
CORONAVIRUS HKU1-RVPPCR: NOT DETECTED
Coronavirus NL63: NOT DETECTED
Coronavirus OC43: NOT DETECTED
Influenza A: NOT DETECTED
Influenza B: NOT DETECTED
MYCOPLASMA PNEUMONIAE-RVPPCR: NOT DETECTED
Metapneumovirus: NOT DETECTED
PARAINFLUENZA VIRUS 2-RVPPCR: NOT DETECTED
Parainfluenza Virus 1: NOT DETECTED
Parainfluenza Virus 3: NOT DETECTED
Parainfluenza Virus 4: NOT DETECTED
Respiratory Syncytial Virus: NOT DETECTED
Rhinovirus / Enterovirus: NOT DETECTED

## 2018-02-28 LAB — BASIC METABOLIC PANEL
ANION GAP: 14 (ref 5–15)
BUN: 91 mg/dL — ABNORMAL HIGH (ref 6–20)
CO2: 20 mmol/L — ABNORMAL LOW (ref 22–32)
CREATININE: 3.55 mg/dL — AB (ref 0.61–1.24)
Calcium: 8.1 mg/dL — ABNORMAL LOW (ref 8.9–10.3)
Chloride: 105 mmol/L (ref 101–111)
GFR calc non Af Amer: 14 mL/min — ABNORMAL LOW (ref >60)
GFR, EST AFRICAN AMERICAN: 16 mL/min — AB (ref >60)
Glucose, Bld: 197 mg/dL — ABNORMAL HIGH (ref 65–99)
POTASSIUM: 4.9 mmol/L (ref 3.5–5.1)
SODIUM: 139 mmol/L (ref 135–145)

## 2018-02-28 LAB — SODIUM, URINE, RANDOM: Sodium, Ur: 53 mmol/L

## 2018-02-28 LAB — IRON AND TIBC
IRON: 44 ug/dL — AB (ref 45–182)
Saturation Ratios: 28 % (ref 17.9–39.5)
TIBC: 155 ug/dL — ABNORMAL LOW (ref 250–450)
UIBC: 111 ug/dL

## 2018-02-28 LAB — GLUCOSE, CAPILLARY
GLUCOSE-CAPILLARY: 170 mg/dL — AB (ref 65–99)
GLUCOSE-CAPILLARY: 173 mg/dL — AB (ref 65–99)
GLUCOSE-CAPILLARY: 232 mg/dL — AB (ref 65–99)
Glucose-Capillary: 161 mg/dL — ABNORMAL HIGH (ref 65–99)
Glucose-Capillary: 173 mg/dL — ABNORMAL HIGH (ref 65–99)
Glucose-Capillary: 225 mg/dL — ABNORMAL HIGH (ref 65–99)

## 2018-02-28 LAB — CREATININE, URINE, RANDOM: Creatinine, Urine: 45.05 mg/dL

## 2018-02-28 LAB — LACTIC ACID, PLASMA
Lactic Acid, Venous: 2.5 mmol/L (ref 0.5–1.9)
Lactic Acid, Venous: 1.7 mmol/L (ref 0.5–1.9)
Lactic Acid, Venous: 2.4 mmol/L (ref 0.5–1.9)

## 2018-02-28 LAB — TROPONIN I
TROPONIN I: 4.88 ng/mL — AB (ref <0.03)
Troponin I: 5.54 ng/mL (ref <0.03)
Troponin I: 3.83 ng/mL (ref <0.03)

## 2018-02-28 LAB — EXPECTORATED SPUTUM ASSESSMENT W GRAM STAIN, RFLX TO RESP C

## 2018-02-28 LAB — PROTIME-INR
INR: 3.15
Prothrombin Time: 32.1 seconds — ABNORMAL HIGH (ref 11.4–15.2)

## 2018-02-28 LAB — EXPECTORATED SPUTUM ASSESSMENT W REFEX TO RESP CULTURE

## 2018-02-28 SURGERY — CARDIOVERSION
Anesthesia: General

## 2018-02-28 MED ORDER — LEVOTHYROXINE SODIUM 88 MCG PO TABS
88.0000 ug | ORAL_TABLET | Freq: Every day | ORAL | Status: DC
  Administered 2018-03-01: 88 ug via ORAL
  Filled 2018-02-28: qty 1

## 2018-02-28 MED ORDER — SODIUM CHLORIDE 0.9 % IV SOLN
INTRAVENOUS | Status: DC
  Administered 2018-02-28: 13:00:00 via INTRAVENOUS

## 2018-02-28 MED ORDER — INSULIN ASPART 100 UNIT/ML ~~LOC~~ SOLN
0.0000 [IU] | Freq: Three times a day (TID) | SUBCUTANEOUS | Status: DC
  Administered 2018-02-28: 3 [IU] via SUBCUTANEOUS
  Administered 2018-03-01: 2 [IU] via SUBCUTANEOUS
  Administered 2018-03-01: 7 [IU] via SUBCUTANEOUS
  Administered 2018-03-01: 2 [IU] via SUBCUTANEOUS

## 2018-02-28 MED ORDER — ATORVASTATIN CALCIUM 40 MG PO TABS
40.0000 mg | ORAL_TABLET | Freq: Every day | ORAL | Status: DC
  Administered 2018-02-28: 40 mg via ORAL
  Filled 2018-02-28: qty 1

## 2018-02-28 MED ORDER — INSULIN ASPART 100 UNIT/ML ~~LOC~~ SOLN
0.0000 [IU] | Freq: Every day | SUBCUTANEOUS | Status: DC
  Administered 2018-02-28: 2 [IU] via SUBCUTANEOUS
  Administered 2018-03-01 – 2018-03-02 (×2): 3 [IU] via SUBCUTANEOUS
  Administered 2018-03-03: 5 [IU] via SUBCUTANEOUS

## 2018-02-28 MED ORDER — BENZONATATE 100 MG PO CAPS: 100.0000 mg | ORAL_CAPSULE | Freq: Three times a day (TID) | ORAL | Status: DC | PRN

## 2018-02-28 MED ORDER — WARFARIN SODIUM 1 MG PO TABS
1.0000 mg | ORAL_TABLET | Freq: Once | ORAL | Status: AC
  Administered 2018-02-28: 1 mg via ORAL
  Filled 2018-02-28: qty 1

## 2018-02-28 MED ORDER — ASPIRIN EC 81 MG PO TBEC
81.0000 mg | DELAYED_RELEASE_TABLET | Freq: Every day | ORAL | Status: DC
  Administered 2018-02-28: 81 mg via ORAL
  Filled 2018-02-28: qty 1

## 2018-02-28 MED ORDER — FUROSEMIDE 10 MG/ML IJ SOLN
20.0000 mg | Freq: Once | INTRAMUSCULAR | Status: AC
  Administered 2018-03-01: 20 mg via INTRAVENOUS
  Filled 2018-02-28: qty 2

## 2018-02-28 MED ORDER — ACETAMINOPHEN 325 MG PO TABS: 650.0000 mg | ORAL_TABLET | ORAL | Status: DC | PRN

## 2018-02-28 NOTE — Progress Notes (Signed)
TEAM 1 - Stepdown/ICU TEAM  Adam [name]  ZOX:[id number] DOB: 07-17-1923 DOA: 01/31/2018 PCP: Lucky Cowboy, MD    Brief Narrative:  84 year old male with a hx of CAD, AoS with a valve area of 1 but peak gradient of 35, atrial fibrillation on Coumadin, and CKD stage IV who presented w/ 3 days of shaking chills and shortness of breath. Patient was seen by his PCP and started on doxycycline.   In the ED he was found to have a leukocytosis, GFR of 17, and a CXR noting perihilar infiltrates suggestive of pneumonia versus edema.  EKG reveals atrial fibrillation at 100.  Troponin was 4.   Significant Events: 4/28 admit  Subjective: The patient reports that he feels "about the same."  He has been on and off BiPAP support due to recurring hypoxia.  He denies chest pain nausea vomiting or abdominal pain.  He is quite hungry and is anxious to be allowed to eat.  Assessment & Plan:  Severe Sepsis and acute hypoxic respiratory failure due to Multilobar Pneumonia Cont empiric abx and follow clinically - wean off BIPAP as able   NSTEMI Not a candidate for cardiac cath due to obvious reasons - Cards directing medical care   AKI on CKD stage IV Baseline creatinine approximately 2 - keep hydrated and follow  Recent Labs  Lab 02/14/2018 0607 03/13/2018 0054  CREATININE 3.03* 3.55*    Chronic diastolic CHF TTE 06/02/18 noted EF 60-65% - appears volume depleted at this time - gently hydrate - follow weights   Filed Weights   02/14/2018 1916 13-Mar-2018 0435  Weight: 72.6 kg (160 lb 0.9 oz) 72 kg (158 lb 11.7 oz)    Moderate aortic stenosis  Parox atrial fibrillation with acute RVR Chadsvasc is 5 - on chronic warfarin - Cards following - on amio  DM 2 CBG reasonably controlled - follow w/o change for now   HTN BP currently controlled  HLD  DVT prophylaxis: warfarin  Code Status: DNR - NO CODE Family Communication: no family present at time of exam  Disposition Plan:    Consultants:  Nephrology Cardiology   Antimicrobials:  Cefepime 4/28 > Vancomycin 4/28 > 4/29  Objective: Blood pressure 106/77, pulse 88, temperature 97.7 F (36.5 C), temperature source Oral, resp. rate (!) 26, height  (1.702 m), weight 72 kg (158 lb 11.7 oz), SpO2 100 %.  Intake/Output Summary (Last 24 hours) at 2018-03-13 1155 Last data filed at 03-13-2018 0600 Gross per 24 hour  Intake 3 ml  Output 850 ml  Net -847 ml   Filed Weights   03/01/2018 1916 03/13/18 0435  Weight: 72.6 kg (160 lb 0.9 oz) 72 kg (158 lb 11.7 oz)    Examination: General: comfortable on BIPAP Lungs: diffuse crackles - no wheezing  Cardiovascular: Regular rate without murmur gallop or rub normal S1 and S2 Abdomen: Nontender, nondistended, soft, bowel sounds positive, no rebound, no ascites, no appreciable mass Extremities: No significant cyanosis, clubbing, or edema bilateral lower extremities  CBC: Recent Labs  Lab 02/09/2018 0607 2018/03/13 0054  WBC 16.4* 13.8*  NEUTROABS 13.6*  --   HGB 9.7* 9.3*  HCT 31.7* 28.6*  MCV 101.9* 96.6  PLT 235 188   Basic Metabolic Panel: Recent Labs  Lab 02/18/2018 0607 03-13-18 0054  NA 133* 139  K 4.8 4.9  CL 99* 105  CO2 9* 20*  GLUCOSE 229* 197*  BUN 66* 91*  CREATININE 3.03* 3.55*  CALCIUM 8.3* 8.1*   GFR:  Estimated Creatinine Clearance: 12.4 mL/min (A) (by C-G formula based on SCr of 3.55 mg/dL (H)).  Liver Function Tests: No results for input(s): AST, ALT, ALKPHOS, BILITOT, PROT, ALBUMIN in the last 168 hours. No results for input(s): LIPASE, AMYLASE in the last 168 hours. No results for input(s): AMMONIA in the last 168 hours.  Coagulation Profile: Recent Labs  Lab 02/22/18 1151 02/09/2018 0607 2018/03/11 0054  INR 3.2 3.09 3.15    Cardiac Enzymes: Recent Labs  Lab 02/28/2018 0935 02/10/2018 1619 02/21/2018 2031 Mar 11, 2018 0616  TROPONINI 6.35* 10.41* 9.13* 5.54*    HbA1C: Hgb A1c MFr Bld  Date/Time Value Ref Range Status   02/01/2018 09:35 AM 6.9 (H) 4.8 - 5.6 % Final    Comment:    (NOTE) Pre diabetes:          5.7%-6.4% Diabetes:              >6.4% Glycemic control for   <7.0% adults with diabetes   02/08/2018 05:03 PM 7.1 (H) <5.7 % of total Hgb Final    Comment:    For someone without known diabetes, a hemoglobin A1c value of 6.5% or greater indicates that they may have  diabetes and this should be confirmed with a follow-up  test. . For someone with known diabetes, a value <7% indicates  that their diabetes is well controlled and a value  greater than or equal to 7% indicates suboptimal  control. A1c targets should be individualized based on  duration of diabetes, age, comorbid conditions, and  other considerations. . Currently, no consensus exists regarding use of hemoglobin A1c for diagnosis of diabetes for children. .     CBG: Recent Labs  Lab 02/17/2018 2016 03-11-2018 0028 03-11-18 0420 2018-03-11 0740 2018-03-11 1135  GLUCAP 145* 170* 161* 173* 173*    Recent Results (from the past 240 hour(s))  MRSA PCR Screening     Status: None   Collection Time: 02/08/2018  4:01 PM  Result Value Ref Range Status   MRSA by PCR NEGATIVE NEGATIVE Final    Comment:        The GeneXpert MRSA Assay (FDA approved for NASAL specimens only), is one component of a comprehensive MRSA colonization surveillance program. It is not intended to diagnose MRSA infection nor to guide or monitor treatment for MRSA infections. Performed at [facility], [address], [place], [place] [address]   Respiratory Panel by PCR     Status: None   Collection Time: 02/09/2018  9:37 PM  Result Value Ref Range Status   Adenovirus NOT DETECTED NOT DETECTED Final   Coronavirus 229E NOT DETECTED NOT DETECTED Final   Coronavirus HKU1 NOT DETECTED NOT DETECTED Final   Coronavirus NL63 NOT DETECTED NOT DETECTED Final   Coronavirus OC43 NOT DETECTED NOT DETECTED Final   Metapneumovirus NOT DETECTED NOT DETECTED  Final   Rhinovirus / Enterovirus NOT DETECTED NOT DETECTED Final   Influenza A NOT DETECTED NOT DETECTED Final   Influenza B NOT DETECTED NOT DETECTED Final   Parainfluenza Virus 1 NOT DETECTED NOT DETECTED Final   Parainfluenza Virus 2 NOT DETECTED NOT DETECTED Final   Parainfluenza Virus 3 NOT DETECTED NOT DETECTED Final   Parainfluenza Virus 4 NOT DETECTED NOT DETECTED Final   Respiratory Syncytial Virus NOT DETECTED NOT DETECTED Final   Bordetella pertussis NOT DETECTED NOT DETECTED Final   Chlamydophila pneumoniae NOT DETECTED NOT DETECTED Final   Mycoplasma pneumoniae NOT DETECTED NOT DETECTED Final    Comment:  Performed at [facility], [address], [place]     Status: None   Collection Time: 02/01/2018 10:33 PM  Result Value Ref Range Status   Specimen Description SPUTUM  Final   Special Requests NONE  Final   Sputum evaluation   Final    Sputum specimen not acceptable for testing.  Please recollect.   Gram Stain Report Called to,Read Back By and Verified With: RN Foye Deer [phone] MLM Performed at [facility], [address], [place], [place] [address]    Report Status 2018-03-29 FINAL  Final     Scheduled Meds: . amiodarone  200 mg Oral Daily  . aspirin EC  81 mg Oral Daily  . atorvastatin  40 mg Oral q1800  . insulin aspart  0-9 Units Subcutaneous Q4H  . levothyroxine  44 mcg Intravenous Daily  . sodium chloride flush  3 mL Intravenous Q12H  . warfarin  1 mg Oral ONCE-1800  . Warfarin - Pharmacist Dosing Inpatient   Does not apply q1800     LOS: 1 day   Lonia Blood, MD [facility] Office  [phone] Pager - Text Page per Amion as per below:  On-Call/Text Page:      [email or web address]      password [email or web address]  If 7PM-7AM, please contact night-coverage [email or web address] Password [facility] 03/29/2018, 11:55 AM

## 2018-02-28 NOTE — Progress Notes (Signed)
Progress Note  Patient Name: Adam [name] Date of Encounter: 02/23/2018  Primary Cardiologist: No primary care provider on file.   Subjective   Remain short of breath but some improvement in his breathing.  No chest pain.  No other complaints.  Inpatient Medications    Scheduled Meds: . amiodarone  200 mg Oral Daily  . insulin aspart  0-9 Units Subcutaneous Q4H  . levothyroxine  44 mcg Intravenous Daily  . sodium chloride flush  3 mL Intravenous Q12H  . Warfarin - Pharmacist Dosing Inpatient   Does not apply q1800   Continuous Infusions: . sodium chloride    . ceFEPime (MAXIPIME) IV 1 g (02/22/2018 0940)  . [START ON 03/01/2018] vancomycin     PRN Meds: sodium chloride, acetaminophen, ondansetron (ZOFRAN) IV, sodium chloride flush   Vital Signs    Vitals:   02/13/2018 0500 02/09/2018 0811 02/14/2018 0921 02/14/2018 1018  BP: 125/61  106/77   Pulse: 82  88   Resp: (!) 31 (!) 28 (!) 26   Temp:   97.7 F (36.5 C)   TempSrc:   Oral   SpO2: 100% 99% 96% 100%  Weight:      Height:        Intake/Output Summary (Last 24 hours) at 02/10/2018 1042 Last data filed at 02/23/2018 0600 Gross per 24 hour  Intake 3 ml  Output 850 ml  Net -847 ml   Filed Weights   02/16/2018 1916 02/16/2018 0435  Weight: 160 lb 0.9 oz (72.6 kg) 158 lb 11.7 oz (72 kg)    Telemetry    Atrial fibrillation converting to sinus rhythm with PACs at approximately 10:15 AM this morning - Personally Reviewed   Physical Exam  Heart sounds distant but elderly male on high flow oxygen, appears comfortable GEN: No acute distress.   Neck: No JVD Cardiac: RRR, 2/6 systolic murmur at left lower sternal border, no diastolic murmur Respiratory: Clear to auscultation bilaterally over the anterior chest. GI: Soft, nontender, non-distended  MS: No edema; No deformity. Neuro:  Nonfocal  Psych: Normal affect   Labs    Chemistry Recent Labs  Lab 02/15/2018 0607 02/23/2018 0054  NA 133* 139  K 4.8 4.9  CL 99*  105  CO2 9* 20*  GLUCOSE 229* 197*  BUN 66* 91*  CREATININE 3.03* 3.55*  CALCIUM 8.3* 8.1*  GFRNONAA 17* 14*  GFRAA 19* 16*  ANIONGAP 25* 14     Hematology Recent Labs  Lab 02/03/2018 0607 02/14/2018 0054  WBC 16.4* 13.8*  RBC 3.11* 2.96*  HGB 9.7* 9.3*  HCT 31.7* 28.6*  MCV 101.9* 96.6  MCH 31.2 31.4  MCHC 30.6 32.5  RDW 15.1 14.7  PLT 235 188    Cardiac Enzymes Recent Labs  Lab 02/10/2018 0935 02/06/2018 1619 02/12/2018 2031 02/13/2018 0616  TROPONINI 6.35* 10.41* 9.13* 5.54*    Recent Labs  Lab 03/01/2018 0612  TROPIPOC 4.13*     BNP Recent Labs  Lab 02/25/2018 0607  BNP 1,044.6*     DDimer No results for input(s): DDIMER in the last 168 hours.   Radiology    Ct Chest Wo Contrast  Result Date: 02/08/2018 CLINICAL DATA:  Respiratory distress. EXAM: CT CHEST WITHOUT CONTRAST TECHNIQUE: Multidetector CT imaging of the chest was performed following the standard protocol without IV contrast. COMPARISON:  Chest x-ray February 27, 2018.  Chest CT December 05, 2004 FINDINGS: Cardiovascular: Coronary artery calcifications. Mild cardiomegaly. Atherosclerotic change in the nonaneurysmal aorta. Central pulmonary arteries are  normal in caliber. Mediastinum/Nodes: Shotty and mildly enlarged lymph nodes are identified in the mediastinum. A pre tracheal node measuring 15 mm is seen on series 3, image 59. Pleural effusions. No pericardial effusion. The thyroid and esophagus are unremarkable. Lungs/Pleura: Central airways are normal. No pneumothorax. Bilateral pleural effusions with atelectasis, right greater than left. Ground-glass opacities are identified in the lungs, affecting all lobes but most pronounced in the upper lobes. There is also some interlobular thickening in the upper lobes. There are regions of more focal consolidation as well such as in the right lower lobe on image 101 in the right upper lobe on image 70. Upper Abdomen: Bilateral renal cysts. No other acute abnormalities on  limited views of the upper abdomen. Musculoskeletal: No chest wall mass or suspicious bone lesions identified. IMPRESSION: 1. Diffuse bilateral pulmonary opacities. There are large ground-glass components particularly in the upper lobes and a few regions of more focal opacity in the right upper and lower lobes. The findings could represent either edema or infection. I suspect a large component of edema. That being said, the more focal regions of infiltrate on the right could represent a superimposed pneumonia. Recommend follow-up to resolution. 2. Coronary artery calcifications. Atherosclerotic changes in the nonaneurysmal aorta. 3. Shotty and mildly enlarged mediastinal nodes are probably reactive. 4. No other abnormalities. Aortic Atherosclerosis (ICD10-I70.0). Electronically Signed   By: Gerome Sam III M.D   On: 02/03/2018 12:56   US Renal  Result Date: 02/12/2018 CLINICAL DATA:  Acute kidney injury. EXAM: RENAL / URINARY TRACT ULTRASOUND COMPLETE COMPARISON:  CT, 03/21/2013. FINDINGS: Right Kidney: Length: 13.5 cm. Diffuse cortical thinning. Multiple cysts. From the lower pole, there is a 5.8 x 5.1 x 4.9 cm cyst. There is a somewhat complicated appearing cystic mass arising from the upper pole measuring 2.5 x 1.8 x 2.6 cm. No stones. No hydronephrosis. Left Kidney: Length: 18.3 cm. Renal parenchyma not well visualized. There are numerous cysts. Largest arises from the region of the mid right kidney measuring 8.3 x 6.0 x 7.3 cm. No solid masses. No stones. No hydronephrosis. Bladder: Appears normal for degree of bladder distention. IMPRESSION: 1. Bilateral renal cysts, more numerous on the left. On the left this obscures the renal parenchyma. 1 cyst on the right is mildly complicated, arising from the upper pole. Recommend follow-up of this cyst with ultrasound to document stability, in 4-6 months. 2. Thinned renal parenchyma on the right. 3. No hydronephrosis. Electronically Signed   By: Amie Portland  M.D.   On: 02/02/2018 17:13   Dg Chest Port 1 View  Result Date: 02/03/2018 CLINICAL DATA:  Shortness of breath EXAM: PORTABLE CHEST 1 VIEW COMPARISON:  02/25/2018 FINDINGS: Shallow inspiration. Cardiac enlargement. Perihilar interstitial and alveolar infiltrates progressing since previous study. This suggests developing multifocal pneumonia or edema. Underlying chronic bronchitic changes. No blunting of costophrenic angles. No pneumothorax. Mediastinal contours appear intact. Calcification of the aorta. IMPRESSION: Increasing perihilar interstitial and alveolar infiltrates suggest developing multifocal pneumonia or edema. Underlying chronic bronchitic changes. Aortic atherosclerosis. Electronically Signed   By: Burman Nieves M.D.   On: 02/01/2018 06:59    Cardiac Studies   Echo 01/31/2018: Study Conclusions  - Left ventricle: The cavity size was normal. Wall thickness was   increased in a pattern of moderate LVH. Systolic function was   normal. The estimated ejection fraction was in the range of 60%   to 65%. The study is not technically sufficient to allow   evaluation of LV diastolic function. -  Aortic valve: There was moderate stenosis. There was mild   regurgitation. - Mitral valve: Small diastolic gradient but MVA above 2.0 cm 2.   Severely calcified annulus. Severely thickened, severely   calcified leaflets . - Left atrium: The atrium was moderately dilated. - Atrial septum: No defect or patent foramen ovale was identified.  Patient Profile     84 y.o. male with history of paroxysmal atrial fibrillation who is now admitted with acute respiratory failure felt to be multifactorial from community-acquired pneumonia, acute on chronic diastolic heart failure, and possibly precipitated by atrial fibrillation  Assessment & Plan    1.  Paroxysmal atrial fibrillation: Chart reviewed.  Patient had been on amiodarone which was discontinued because of lab evidence of hyperthyroidism.  He  is admitted and found to be in atrial fibrillation once again, restarted on amiodarone with limited treatment options otherwise available.  Was tentatively set up for cardioversion today, but he has just spontaneously converted this morning.  Telemetry is reviewed and now shows sinus rhythm with frequent PACs.  Patient is anticoagulated with warfarin.  Would continue amiodarone for now.  2.  Acute on chronic kidney injury: Nephrology following.  3.  Acute on chronic respiratory failure: Multifactorial as detailed above.  I think best to avoid IV diuresis in the setting of acute kidney injury as his creatinine trend is 2.03 3/4 - 2.63 4/9 - 3.03 4/28 - 3.55 mg/dL today. CXR with bilateral infiltrates multifocal pneumonia versus edema.   4.  Non-STEMI: Significant troponin elevation up to 10.4, possibly related to demand ischemia in the setting of respiratory failure.  The patient had an elevated lactate level as well.  We will repeat an echocardiogram to assess for any new wall motion abnormalities or significant LV dysfunction.  The patient is not a candidate for cardiac catheterization with advanced kidney disease at age 31. With therapeutic INR on warfarin, would avoid parenteral IV anticoagulation. Add aspirin 81 mg daily. Place back on atorvastatin 40 mg daily.  For questions or updates, please contact [facility] Please consult [email or web address] for contact info under Cardiology/STEMI.      Signed, Tonny Bollman, MD  03/01/2018, 10:42 AM

## 2018-02-28 NOTE — Plan of Care (Signed)
Discussed plan of care with patient.  Patient is wearing BiPap.  Bipap was removed so patient could eat.  Patient has not eaten all day.  Explained to patient and his son that patient will be NPO at midnight.  Both understood the diet restriction.    Patient's lactic acid is elevated and bladder scan showed approx of urine in bladder.  In and out cath was done and 600 ml of urine removed.  Instantly patient's breathing improved.  First lactic acid was 4.9.  Most recent was 2.45.  Orders have been initiated for lactic acid to be tested two more times.

## 2018-02-28 NOTE — Progress Notes (Signed)
Subjective: Interval History: has complaints cough with phlegm.  Objective: Vital signs in last 24 hours: Temp:  [97.4 F (36.3 C)-99 F (37.2 C)] 98.2 F (36.8 C) (04/29 0420) Pulse Rate:  [80-100] 82 (04/29 0500) Resp:  [21-40] 28 (04/29 0811) BP: (112-162)/(60-120) 125/61 (04/29 0500) SpO2:  [91 %-100 %] 99 % (04/29 0811) FiO2 (%):  [50 %] 50 % (04/28 1218) Weight:  [72 kg (158 lb 11.7 oz)-72.6 kg (160 lb 0.9 oz)] 72 kg (158 lb 11.7 oz) (04/29 0435) Weight change:   Intake/Output from previous day: 04/28 0701 - 04/29 0700 In: 3 [I.V.:3] Out: 850 [Urine:850] Intake/Output this shift: No intake/output data recorded.  General appearance: alert, cooperative and no distress Resp: rales bibasilar and rhonchi bibasilar Cardio: S1, S2 normal and systolic murmur: systolic ejection 2/6, decrescendo at 2nd left intercostal space GI: soft, non-tender; bowel sounds normal; no masses,  no organomegaly Extremities: extremities normal, atraumatic, no cyanosis or edema  Lab Results: Recent Labs    03/16/18 0607 02/17/2018 0054  WBC 16.4* 13.8*  HGB 9.7* 9.3*  HCT 31.7* 28.6*  PLT 235 188   BMET:  Recent Labs    03/16/2018 0607 02/17/2018 0054  NA 133* 139  K 4.8 4.9  CL 99* 105  CO2 9* 20*  GLUCOSE 229* 197*  BUN 66* 91*  CREATININE 3.03* 3.55*  CALCIUM 8.3* 8.1*   No results for input(s): PTH in the last 72 hours. Iron Studies: No results for input(s): IRON, TIBC, TRANSFERRIN, FERRITIN in the last 72 hours.  Studies/Results: Ct Chest Wo Contrast  Result Date: 2018/03/16 CLINICAL DATA:  Respiratory distress. EXAM: CT CHEST WITHOUT CONTRAST TECHNIQUE: Multidetector CT imaging of the chest was performed following the standard protocol without IV contrast. COMPARISON:  Chest x-ray 03-16-2018.  Chest CT December 05, 2004 FINDINGS: Cardiovascular: Coronary artery calcifications. Mild cardiomegaly. Atherosclerotic change in the nonaneurysmal aorta. Central pulmonary arteries are  normal in caliber. Mediastinum/Nodes: Shotty and mildly enlarged lymph nodes are identified in the mediastinum. A pre tracheal node measuring 15 mm is seen on series 3, image 59. Pleural effusions. No pericardial effusion. The thyroid and esophagus are unremarkable. Lungs/Pleura: Central airways are normal. No pneumothorax. Bilateral pleural effusions with atelectasis, right greater than left. Ground-glass opacities are identified in the lungs, affecting all lobes but most pronounced in the upper lobes. There is also some interlobular thickening in the upper lobes. There are regions of more focal consolidation as well such as in the right lower lobe on image 101 in the right upper lobe on image 70. Upper Abdomen: Bilateral renal cysts. No other acute abnormalities on limited views of the upper abdomen. Musculoskeletal: No chest wall mass or suspicious bone lesions identified. IMPRESSION: 1. Diffuse bilateral pulmonary opacities. There are large ground-glass components particularly in the upper lobes and a few regions of more focal opacity in the right upper and lower lobes. The findings could represent either edema or infection. I suspect a large component of edema. That being said, the more focal regions of infiltrate on the right could represent a superimposed pneumonia. Recommend follow-up to resolution. 2. Coronary artery calcifications. Atherosclerotic changes in the nonaneurysmal aorta. 3. Shotty and mildly enlarged mediastinal nodes are probably reactive. 4. No other abnormalities. Aortic Atherosclerosis (ICD10-I70.0). Electronically Signed   By: Gerome Sam III M.D   On: 03-16-2018 12:56   US Renal  Result Date: 2018/03/16 CLINICAL DATA:  Acute kidney injury. EXAM: RENAL / URINARY TRACT ULTRASOUND COMPLETE COMPARISON:  CT, 03/21/2013.  FINDINGS: Right Kidney: Length: 13.5 cm. Diffuse cortical thinning. Multiple cysts. From the lower pole, there is a 5.8 x 5.1 x 4.9 cm cyst. There is a somewhat  complicated appearing cystic mass arising from the upper pole measuring 2.5 x 1.8 x 2.6 cm. No stones. No hydronephrosis. Left Kidney: Length: 18.3 cm. Renal parenchyma not well visualized. There are numerous cysts. Largest arises from the region of the mid right kidney measuring 8.3 x 6.0 x 7.3 cm. No solid masses. No stones. No hydronephrosis. Bladder: Appears normal for degree of bladder distention. IMPRESSION: 1. Bilateral renal cysts, more numerous on the left. On the left this obscures the renal parenchyma. 1 cyst on the right is mildly complicated, arising from the upper pole. Recommend follow-up of this cyst with ultrasound to document stability, in 4-6 months. 2. Thinned renal parenchyma on the right. 3. No hydronephrosis. Electronically Signed   By: Amie Portland M.D.   On: 02/16/2018 17:13   Dg Chest Port 1 View  Result Date: 02/16/2018 CLINICAL DATA:  Shortness of breath EXAM: PORTABLE CHEST 1 VIEW COMPARISON:  02/25/2018 FINDINGS: Shallow inspiration. Cardiac enlargement. Perihilar interstitial and alveolar infiltrates progressing since previous study. This suggests developing multifocal pneumonia or edema. Underlying chronic bronchitic changes. No blunting of costophrenic angles. No pneumothorax. Mediastinal contours appear intact. Calcification of the aorta. IMPRESSION: Increasing perihilar interstitial and alveolar infiltrates suggest developing multifocal pneumonia or edema. Underlying chronic bronchitic changes. Aortic atherosclerosis. Electronically Signed   By: Burman Nieves M.D.   On: 02/12/2018 06:59    I have reviewed the patient's current medications.  Assessment/Plan: 1 CKD 4/AKI presumably hemodynamic, not clear, Cr ^, will check urine chem. GFR low. No obstruct on U/S 2 Pneu 3 Anemia 4 Afib rate control, anticoag 5 Dm controlled 6 AS P avoid diuresis, cont AB, supportive care      LOS: 1 day   Duwane Lexi Conaty 03/09/18,8:29 AM

## 2018-02-28 NOTE — Progress Notes (Signed)
ANTICOAGULATION CONSULT NOTE - Follow Up Consult  Pharmacy Consult for Warfarin Indication: atrial fibrillation  No Known Allergies  73 kg  Vital Signs: Temp: 97.7 F (36.5 C) (04/29 0921) Temp Source: Oral (04/29 0921) BP: 106/77 (04/29 0921) Pulse Rate: 88 (04/29 0921)  Labs: Recent Labs    02/12/2018 0607  02/03/2018 1619 02/07/2018 2031 02/13/2018 0054 02/25/2018 0616  HGB 9.7*  --   --   --  9.3*  --   HCT 31.7*  --   --   --  28.6*  --   PLT 235  --   --   --  188  --   LABPROT 31.7*  --   --   --  32.1*  --   INR 3.09  --   --   --  3.15  --   CREATININE 3.03*  --   --   --  3.55*  --   TROPONINI  --    < > 10.41* 9.13*  --  5.54*   < > = values in this interval not displayed.    Estimated Creatinine Clearance: 12.4 mL/min (A) (by C-G formula based on SCr of 3.55 mg/dL (H)).   Medical History: Past Medical History:  Diagnosis Date  . Atrial fibrillation (HCC)   . CORONARY ARTERY DISEASE   . HYPERTENSION   . Hypothyroidism   . Leg pain    a. ABI (05/2014):  ABIs falsely elevated due to calcification; TBIs ok; dopplers without significant stenosis  . MURMUR   . NEPHROLITHIASIS, HX OF   . SINUS BRADYCARDIA   . VITAMIN B12 DEFICIENCY    Assessment: 84 y/o M on warfarin PTA fo afib, presents to the ED today with resp distress, found to have elevated troponin and started on IV heparin. IV heparin is now d/c'ed and pharmacy consulted to resume warfarin.   INR = 3.15  Home warfarin dose: 2.5 mg daily except 1.25 mg on Monday   Goal of Therapy:  INR 2-3 Monitor platelets by anticoagulation protocol: Yes   Plan:  -Warfarin 1 mg po x 1 dose tonight -Monitor daily PT/INR   Thank you Okey Regal, PharmD [phone]

## 2018-02-28 NOTE — Progress Notes (Signed)
Pt given break off of NIV.  RR and WOB remain at baseline (RR upper 20s, low 30s).  SpO2 98% on 10L  HFNC.  Discussed with RN.  Monitor closely, return to NIV after short break or with VS changes.

## 2018-03-01 DIAGNOSIS — J9601 Acute respiratory failure with hypoxia: Secondary | ICD-10-CM

## 2018-03-01 DIAGNOSIS — I480 Paroxysmal atrial fibrillation: Secondary | ICD-10-CM

## 2018-03-01 LAB — CBC
HEMATOCRIT: 27.4 % — AB (ref 39.0–52.0)
HEMOGLOBIN: 9 g/dL — AB (ref 13.0–17.0)
MCH: 31.3 pg (ref 26.0–34.0)
MCHC: 32.8 g/dL (ref 30.0–36.0)
MCV: 95.1 fL (ref 78.0–100.0)
Platelets: 199 10*3/uL (ref 150–400)
RBC: 2.88 MIL/uL — ABNORMAL LOW (ref 4.22–5.81)
RDW: 14.7 % (ref 11.5–15.5)
WBC: 11.8 10*3/uL — AB (ref 4.0–10.5)

## 2018-03-01 LAB — RENAL FUNCTION PANEL
ANION GAP: 14 (ref 5–15)
Albumin: 2.1 g/dL — ABNORMAL LOW (ref 3.5–5.0)
BUN: 93 mg/dL — ABNORMAL HIGH (ref 6–20)
CHLORIDE: 106 mmol/L (ref 101–111)
CO2: 19 mmol/L — ABNORMAL LOW (ref 22–32)
Calcium: 7.8 mg/dL — ABNORMAL LOW (ref 8.9–10.3)
Creatinine, Ser: 3.19 mg/dL — ABNORMAL HIGH (ref 0.61–1.24)
GFR calc Af Amer: 18 mL/min — ABNORMAL LOW (ref >60)
GFR calc non Af Amer: 16 mL/min — ABNORMAL LOW (ref >60)
GLUCOSE: 274 mg/dL — AB (ref 65–99)
POTASSIUM: 3.2 mmol/L — AB (ref 3.5–5.1)
Phosphorus: 4.5 mg/dL (ref 2.5–4.6)
Sodium: 139 mmol/L (ref 135–145)

## 2018-03-01 LAB — GLUCOSE, CAPILLARY
GLUCOSE-CAPILLARY: 316 mg/dL — AB (ref 65–99)
GLUCOSE-CAPILLARY: 166 mg/dL — AB (ref 65–99)
GLUCOSE-CAPILLARY: 244 mg/dL — AB (ref 65–99)
Glucose-Capillary: 186 mg/dL — ABNORMAL HIGH (ref 65–99)

## 2018-03-01 LAB — LACTIC ACID, PLASMA
Lactic Acid, Venous: 2.2 mmol/L (ref 0.5–1.9)
Lactic Acid, Venous: 1.7 mmol/L (ref 0.5–1.9)

## 2018-03-01 LAB — PARATHYROID HORMONE, INTACT (NO CA): PTH: 69 pg/mL — AB (ref 15–65)

## 2018-03-01 LAB — TROPONIN I: TROPONIN I: 2.78 ng/mL — AB (ref <0.03)

## 2018-03-01 MED ORDER — METHYLPREDNISOLONE SODIUM SUCC 125 MG IJ SOLR
60.0000 mg | Freq: Two times a day (BID) | INTRAMUSCULAR | Status: DC
  Administered 2018-03-01 – 2018-03-02 (×2): 60 mg via INTRAVENOUS
  Filled 2018-03-01 (×2): qty 2

## 2018-03-01 MED ORDER — POTASSIUM CHLORIDE 10 MEQ/100ML IV SOLN
10.0000 meq | INTRAVENOUS | Status: AC
  Administered 2018-03-01 (×3): 10 meq via INTRAVENOUS
  Filled 2018-03-01 (×3): qty 100

## 2018-03-01 MED ORDER — ACETAMINOPHEN 325 MG RE SUPP: 325.0000 mg | RECTAL | Status: DC | PRN

## 2018-03-01 MED ORDER — FUROSEMIDE 10 MG/ML IJ SOLN
20.0000 mg | Freq: Once | INTRAMUSCULAR | Status: AC
  Administered 2018-03-01: 20 mg via INTRAVENOUS
  Filled 2018-03-01: qty 2

## 2018-03-01 MED ORDER — POTASSIUM CHLORIDE CRYS ER 20 MEQ PO TBCR: 40.0000 meq | EXTENDED_RELEASE_TABLET | Freq: Once | ORAL | Status: DC

## 2018-03-01 NOTE — Progress Notes (Signed)
Repeat trial off of NIV.  Pt desat to low 70s, despite max HFNC.  Returned to NIV, wean FIO2 as tolerated.  RN will page MD.

## 2018-03-01 NOTE — Progress Notes (Signed)
Inpatient Diabetes Program Recommendations  AACE/ADA: New Consensus Statement on Inpatient Glycemic Control (2015)  Target Ranges:  Prepandial:   less than 140 mg/dL      Peak postprandial:   less than 180 mg/dL (1-2 hours)      Critically ill patients:  140 - 180 mg/dL   Results for Adam [name], Adam [name] (MRN [id number]) as of 03/01/2018 11:50  Ref. Range 2018/03/25 04:20 Mar 25, 2018 07:40 Mar 25, 2018 11:35 03/25/18 17:28 03-25-18 20:40 03/01/2018 07:48  Glucose-Capillary Latest Ref Range: 65 - 99 mg/dL 409 (H) 811 (H) 914 (H) 225 (H) 232 (H) 316 (H)   Results for Adam [name], Adam [name] (MRN [id number]) as of 03/01/2018 11:50  Ref. Range 10/29/2017 11:15 02/08/2018 17:03 02/10/2018 09:35  Hemoglobin A1C Latest Ref Range: 4.8 - 5.6 % 6.8 (H) 7.1 (H) 6.9 (H)    Review of Glycemic Control  Diabetes history: DM2 Outpatient Diabetes medications: None Current orders for Inpatient glycemic control: Novolog 0-9 units TID with meals, Novolog 0-5 units QHS  Inpatient Diabetes Program Recommendations:  Insulin - Basal: Noted glucose has been more elevated over the past 12 hours but unclear why glucose is more elevated. Fasting glucose 316 mg/dl this morning. MD may want to consider ordering Lantus 5 units daily.  HgbA1C: A1C 6.9% on 02/04/2018 indicating average glucose of 151 mg/dl over the past 2-3 months which is appropriate goal for patient. Diet: Please consider discontinuing Regular diet and order Carb Modified diet.  Thanks, Orlando Penner, RN, MSN, CDE Diabetes Coordinator Inpatient Diabetes Program [phone] (Team Pager from 8am to 5pm)

## 2018-03-01 NOTE — Progress Notes (Signed)
ANTICOAGULATION CONSULT NOTE - Follow Up Consult  Pharmacy Consult for Warfarin Indication: atrial fibrillation  No Known Allergies  73 kg  Vital Signs: Temp: 98.4 F (36.9 C) (04/30 0019) Temp Source: Axillary (04/30 0019) BP: 131/61 (04/30 0019) Pulse Rate: 85 (04/30 0520)  Labs: Recent Labs    02/03/2018 0607  Mar 15, 2018 0054  03/15/2018 1035 03-15-18 1649 03/15/2018 2320 03/01/18 0233  HGB 9.7*  --  9.3*  --   --   --   --  9.0*  HCT 31.7*  --  28.6*  --   --   --   --  27.4*  PLT 235  --  188  --   --   --   --  199  LABPROT 31.7*  --  32.1*  --   --   --   --  41.6*  INR 3.09  --  3.15  --   --   --   --  4.38*  CREATININE 3.03*  --  3.55*  --   --   --   --  3.19*  TROPONINI  --    < >  --    < > 4.88* 3.83* 2.78*  --    < > = values in this interval not displayed.    Estimated Creatinine Clearance: 13.8 mL/min (A) (by C-G formula based on SCr of 3.19 mg/dL (H)).   Medical History: Past Medical History:  Diagnosis Date  . Atrial fibrillation (HCC)   . CORONARY ARTERY DISEASE   . HYPERTENSION   . Hypothyroidism   . Leg pain    a. ABI (05/2014):  ABIs falsely elevated due to calcification; TBIs ok; dopplers without significant stenosis  . MURMUR   . NEPHROLITHIASIS, HX OF   . SINUS BRADYCARDIA   . VITAMIN B12 DEFICIENCY    Assessment: 84 y/o M on Coumadin 2.5mg  daily exc 1.25mg  on Mon PTA for hx Afib. INR 3.09 on admit. Now giving reduced doses and INR up to 4.38. Hgb 9, plts wnl.  Goal of Therapy:  INR 2-3 Monitor platelets by anticoagulation protocol: Yes   Plan:  Hold Coumadin tonight Monitor daily INR, CBC, s/s of bleed  Enzo Bi, PharmD, [facility] Clinical Pharmacist Pager [phone] 03/01/2018 7:40 AM

## 2018-03-01 NOTE — Progress Notes (Signed)
Progress Note  Patient Name: Adam [name] Date of Encounter: 03/01/2018  Primary Cardiologist: Eden Emms  Subjective   Wearing bipap mask. No complaints  Inpatient Medications    Scheduled Meds: . amiodarone  200 mg Oral Daily  . aspirin EC  81 mg Oral Daily  . atorvastatin  40 mg Oral q1800  . insulin aspart  0-5 Units Subcutaneous QHS  . insulin aspart  0-9 Units Subcutaneous TID WC  . levothyroxine  88 mcg Oral QAC breakfast  . potassium chloride  40 mEq Oral Once  . Warfarin - Pharmacist Dosing Inpatient   Does not apply q1800   Continuous Infusions: . sodium chloride 30 mL/hr at 02/11/2018 2354  . ceFEPime (MAXIPIME) IV Stopped (02/21/2018 1010)   PRN Meds: acetaminophen, benzonatate, ondansetron (ZOFRAN) IV   Vital Signs    Vitals:   03/01/18 0501 03/01/18 0520 03/01/18 0748 03/01/18 0753  BP:   (!) 123/59   Pulse:  85 89   Resp:  (!) 26 20   Temp:   98.7 F (37.1 C)   TempSrc:   Axillary   SpO2:  94% 97% 98%  Weight: 156 lb 15.5 oz (71.2 kg)     Height:        Intake/Output Summary (Last 24 hours) at 03/01/2018 1038 Last data filed at 03/01/2018 0348 Gross per 24 hour  Intake 237.5 ml  Output 1350 ml  Net -1112.5 ml   Filed Weights   02/11/2018 0435 03/01/18 0300 03/01/18 0501  Weight: 158 lb 11.7 oz (72 kg) 159 lb 6.3 oz (72.3 kg) 156 lb 15.5 oz (71.2 kg)    Telemetry    Sinus with frequent PACs- Personally Reviewed   Physical Exam   General: Elderly male in NAD, wearing bipap mask HEENT: OP clear, mucus membranes moist  SKIN: warm, dry. No rashes. Neuro: No focal deficits  Musculoskeletal: Muscle strength 5/5 all ext  Psychiatric: Mood and affect normal  Neck: No JVD, no carotid bruits, no thyromegaly, no lymphadenopathy.  Lungs:Clear bilaterally, no wheezes, rhonci, crackles Cardiovascular: Irregular. Systolic murmur.  Abdomen:Soft. Bowel sounds present. Non-tender.  Extremities: No lower extremity edema. Pulses are 2 + in the bilateral  DP/PT.    Labs    Chemistry Recent Labs  Lab 02/25/18 1053 02/23/2018 0607 02/16/2018 0054 03/01/18 0233  NA 137 133* 139 139  K 5.7* 4.8 4.9 3.2*  CL 103 99* 105 106  CO2 19* 9* 20* 19*  GLUCOSE 285* 229* 197* 274*  BUN 39* 66* 91* 93*  CREATININE 2.20* 3.03* 3.55* 3.19*  CALCIUM 8.5* 8.3* 8.1* 7.8*  PROT 6.8  --   --   --   ALBUMIN  --   --   --  2.1*  AST 15  --   --   --   ALT 11  --   --   --   BILITOT 1.7*  --   --   --   GFRNONAA 25* 17* 14* 16*  GFRAA 29* 19* 16* 18*  ANIONGAP  --  25* 14 14     Hematology Recent Labs  Lab 02/12/2018 0607 02/19/2018 0054 03/01/18 0233  WBC 16.4* 13.8* 11.8*  RBC 3.11* 2.96* 2.88*  HGB 9.7* 9.3* 9.0*  HCT 31.7* 28.6* 27.4*  MCV 101.9* 96.6 95.1  MCH 31.2 31.4 31.3  MCHC 30.6 32.5 32.8  RDW 15.1 14.7 14.7  PLT 235 188 199    Cardiac Enzymes Recent Labs  Lab 02/02/2018 0616 02/13/2018 1035 02/11/2018 1649  2018-03-07 2320  TROPONINI 5.54* 4.88* 3.83* 2.78*    Recent Labs  Lab 02/28/2018 0612  TROPIPOC 4.13*     BNP Recent Labs  Lab 02/08/2018 0607  BNP 1,044.6*     DDimer No results for input(s): DDIMER in the last 168 hours.   Radiology    Ct Chest Wo Contrast  Result Date: 02/18/2018 CLINICAL DATA:  Respiratory distress. EXAM: CT CHEST WITHOUT CONTRAST TECHNIQUE: Multidetector CT imaging of the chest was performed following the standard protocol without IV contrast. COMPARISON:  Chest x-ray February 27, 2018.  Chest CT December 05, 2004 FINDINGS: Cardiovascular: Coronary artery calcifications. Mild cardiomegaly. Atherosclerotic change in the nonaneurysmal aorta. Central pulmonary arteries are normal in caliber. Mediastinum/Nodes: Shotty and mildly enlarged lymph nodes are identified in the mediastinum. A pre tracheal node measuring 15 mm is seen on series 3, image 59. Pleural effusions. No pericardial effusion. The thyroid and esophagus are unremarkable. Lungs/Pleura: Central airways are normal. No pneumothorax. Bilateral  pleural effusions with atelectasis, right greater than left. Ground-glass opacities are identified in the lungs, affecting all lobes but most pronounced in the upper lobes. There is also some interlobular thickening in the upper lobes. There are regions of more focal consolidation as well such as in the right lower lobe on image 101 in the right upper lobe on image 70. Upper Abdomen: Bilateral renal cysts. No other acute abnormalities on limited views of the upper abdomen. Musculoskeletal: No chest wall mass or suspicious bone lesions identified. IMPRESSION: 1. Diffuse bilateral pulmonary opacities. There are large ground-glass components particularly in the upper lobes and a few regions of more focal opacity in the right upper and lower lobes. The findings could represent either edema or infection. I suspect a large component of edema. That being said, the more focal regions of infiltrate on the right could represent a superimposed pneumonia. Recommend follow-up to resolution. 2. Coronary artery calcifications. Atherosclerotic changes in the nonaneurysmal aorta. 3. Shotty and mildly enlarged mediastinal nodes are probably reactive. 4. No other abnormalities. Aortic Atherosclerosis (ICD10-I70.0). Electronically Signed   By: Gerome Sam III M.D   On: 02/21/2018 12:56   US Renal  Result Date: 02/23/2018 CLINICAL DATA:  Acute kidney injury. EXAM: RENAL / URINARY TRACT ULTRASOUND COMPLETE COMPARISON:  CT, 03/21/2013. FINDINGS: Right Kidney: Length: 13.5 cm. Diffuse cortical thinning. Multiple cysts. From the lower pole, there is a 5.8 x 5.1 x 4.9 cm cyst. There is a somewhat complicated appearing cystic mass arising from the upper pole measuring 2.5 x 1.8 x 2.6 cm. No stones. No hydronephrosis. Left Kidney: Length: 18.3 cm. Renal parenchyma not well visualized. There are numerous cysts. Largest arises from the region of the mid right kidney measuring 8.3 x 6.0 x 7.3 cm. No solid masses. No stones. No  hydronephrosis. Bladder: Appears normal for degree of bladder distention. IMPRESSION: 1. Bilateral renal cysts, more numerous on the left. On the left this obscures the renal parenchyma. 1 cyst on the right is mildly complicated, arising from the upper pole. Recommend follow-up of this cyst with ultrasound to document stability, in 4-6 months. 2. Thinned renal parenchyma on the right. 3. No hydronephrosis. Electronically Signed   By: Amie Portland M.D.   On: 02/24/2018 17:13   Dg Chest Port 1 View  Result Date: Mar 07, 2018 CLINICAL DATA:  Tachypnea with bibasilar crackles EXAM: PORTABLE CHEST 1 VIEW COMPARISON:  CT 03/01/2018, 02/22/2018, 02/25/2018, 02/09/2018 radiograph FINDINGS: Moderate to marked diffuse bilateral ground-glass opacities with more focal consolidations at the left  base and right upper lobe. Enlarged cardiomediastinal silhouette with aortic atherosclerosis. No pneumothorax. IMPRESSION: Slightly worsened diffuse bilateral ground-glass opacities with more focal consolidations in the right upper lobe and left base, findings could reflect underlying pulmonary edema with possible superimposed pneumonia. Electronically Signed   By: Jasmine Pang M.D.   On: 02/16/2018 23:19    Cardiac Studies   Echo 01/31/2018: Study Conclusions  - Left ventricle: The cavity size was normal. Wall thickness was   increased in a pattern of moderate LVH. Systolic function was   normal. The estimated ejection fraction was in the range of 60%   to 65%. The study is not technically sufficient to allow   evaluation of LV diastolic function. - Aortic valve: There was moderate stenosis. There was mild   regurgitation. - Mitral valve: Small diastolic gradient but MVA above 2.0 cm 2.   Severely calcified annulus. Severely thickened, severely   calcified leaflets . - Left atrium: The atrium was moderately dilated. - Atrial septum: No defect or patent foramen ovale was identified.  Patient Profile     84 y.o.  male with history of paroxysmal atrial fibrillation who is now admitted with acute respiratory failure felt to be multifactorial from community-acquired pneumonia, acute on chronic diastolic heart failure, and possibly precipitated by atrial fibrillation  Assessment & Plan    1.  Paroxysmal atrial fibrillation: He is in sinus on amiodarone. Tele reviewed by me this am. Given frequent PACs his rhythm is irregular. Continue amiodarone and coumadin.   2.  Acute on chronic kidney injury: Being followed by Nephrology  3.  Acute on chronic respiratory failure: Per primary team.   4.  Non-STEMI: Troponin elevated in setting of respiratory failure and atrial fib with RVR, likely demand ischemia. Not a cath candidate given advanced age and chronic kidney disease. No new recs today. No heparin given therapeutic INR on coumadin. Will continue ASA and statin.   For questions or updates, please contact [facility] Please consult [email or web address] for contact info under Cardiology/STEMI.      Signed, Verne Carrow, MD  03/01/2018, 10:38 AM

## 2018-03-01 NOTE — Progress Notes (Signed)
[place] TEAM 1 - Stepdown/ICU TEAM  VA BROADWELL  ZOX:[id number] DOB: [birth year] DOA: 02/21/2018 PCP: Lucky Cowboy, MD    Brief Narrative:  84 year old male with a hx of CAD, AoS, atrial fibrillation on Coumadin, and CKD stage IV who presented w/ 3 days of shaking chills and shortness of breath. Patient was seen by his PCP and started on doxycycline.   In the ED he was found to have a leukocytosis, GFR of 17, and a CXR noting perihilar infiltrates suggestive of pneumonia versus edema.  EKG revealed atrial fibrillation at 100.  Troponin was 4.   Significant Events: 4/28 admit  Subjective: The patient has been BIPAP dependent th/o the day, and quickly desats into the upper 70s even on HFNC.  While on BiPAP he appears very stable with saturations in the upper 90s, stable blood pressure, regular heart rate, and no other evidence of distress.  Assessment & Plan:  Severe Sepsis and acute hypoxic respiratory failure due to Multilobar Pneumonia Cont empiric abx and follow clinically - wean off BIPAP as able - add short course of steroids - f/u CXR in AM - slow IVF   NSTEMI Not a candidate for cardiac cath due to obvious reasons - Cards directing medical care - likely demand ischemia   AKI on CKD stage IV Baseline creatinine approximately 2 - crt improved w/ volume expansion but resp status declined somewhat - change IVF to [facility] and f/u in AM   Recent Labs  Lab 02/24/18 1116 02/25/18 1053 02/09/2018 0607 03-20-2018 0054 03/01/18 0233  CREATININE 2.06* 2.20* 3.03* 3.55* 3.19*    Chronic diastolic CHF TTE 06/02/18 noted EF 60-65% - appears euvolemic today - gently hydrated - follow weights - does not appear to be on chronic diuretic tx   Filed Weights   2018-03-20 0435 03/01/18 0300 03/01/18 0501  Weight: 72 kg (158 lb 11.7 oz) 72.3 kg (159 lb 6.3 oz) 71.2 kg (156 lb 15.5 oz)    Moderate aortic stenosis  Parox atrial fibrillation with acute RVR Chadsvasc is 5 - on chronic  warfarin w/ Pharmacy dosing now and INR currently supra therapeutic - Cards following - on amio  DM 2 CBG quite variable - follow w/o change for today   HTN BP currently controlled  HLD  DVT prophylaxis: warfarin  Code Status: DNR - NO CODE Family Communication: Spoke with the patient's wife at bedside  Disposition Plan: Stepdown  Consultants:  Nephrology Cardiology   Antimicrobials:  Cefepime 4/28 > Vancomycin 4/28   Objective: Blood pressure (!) 126/59, pulse 91, temperature 98.3 F (36.8 C), temperature source Axillary, resp. rate (!) 29, height  (1.702 m), weight 71.2 kg (156 lb 15.5 oz), SpO2 100 %.  Intake/Output Summary (Last 24 hours) at 03/01/2018 1326 Last data filed at 03/01/2018 1229 Gross per 24 hour  Intake 337.5 ml  Output 2150 ml  Net -1812.5 ml   Filed Weights   03/20/2018 0435 03/01/18 0300 03/01/18 0501  Weight: 72 kg (158 lb 11.7 oz) 72.3 kg (159 lb 6.3 oz) 71.2 kg (156 lb 15.5 oz)    Examination: General: comfortable on BIPAP - somnolent but in no apparent acute distress while on BIPAP Lungs: diffuse crackles w/o signif change - no wheezing  Cardiovascular: Regular rate - no murmur gallop or rub  Abdomen: NT/ND, soft, bowel sounds positive, no rebound Extremities: No significant edema B LE   CBC: Recent Labs  Lab 02/24/18 1116 02/25/18 1053 02/26/2018 1478 20-Mar-2018 0054 03/01/18 2956  WBC 9.5 12.1* 16.4* 13.8* 11.8*  NEUTROABS 7,486 9,825* 13.6*  --   --   HGB 9.4* 9.4* 9.7* 9.3* 9.0*  HCT 27.9* 28.0* 31.7* 28.6* 27.4*  MCV 94.6 95.9 101.9* 96.6 95.1  PLT 222 218 235 188 199   Basic Metabolic Panel: Recent Labs  Lab 02/13/2018 0607 28-Mar-2018 0054 03/01/18 0233  NA 133* 139 139  K 4.8 4.9 3.2*  CL 99* 105 106  CO2 9* 20* 19*  GLUCOSE 229* 197* 274*  BUN 66* 91* 93*  CREATININE 3.03* 3.55* 3.19*  CALCIUM 8.3* 8.1* 7.8*  PHOS  --   --  4.5   GFR: Estimated Creatinine Clearance: 13.8 mL/min (A) (by C-G formula based on SCr of  3.19 mg/dL (H)).  Liver Function Tests: Recent Labs  Lab 02/25/18 1053 03/01/18 0233  AST 15  --   ALT 11  --   BILITOT 1.7*  --   PROT 6.8  --   ALBUMIN  --  2.1*    Coagulation Profile: Recent Labs  Lab 03/01/2018 0607 03-28-18 0054 03/01/18 0233  INR 3.09 3.15 4.38*    Cardiac Enzymes: Recent Labs  Lab 02/08/2018 2031 28-Mar-2018 0616 Mar 28, 2018 1035 2018-03-28 1649 2018-03-28 2320  TROPONINI 9.13* 5.54* 4.88* 3.83* 2.78*    HbA1C: Hgb A1c MFr Bld  Date/Time Value Ref Range Status  02/18/2018 09:35 AM 6.9 (H) 4.8 - 5.6 % Final    Comment:    (NOTE) Pre diabetes:          5.7%-6.4% Diabetes:              >6.4% Glycemic control for   <7.0% adults with diabetes   02/08/2018 05:03 PM 7.1 (H) <5.7 % of total Hgb Final    Comment:    For someone without known diabetes, a hemoglobin A1c value of 6.5% or greater indicates that they may have  diabetes and this should be confirmed with a follow-up  test. . For someone with known diabetes, a value <7% indicates  that their diabetes is well controlled and a value  greater than or equal to 7% indicates suboptimal  control. A1c targets should be individualized based on  duration of diabetes, age, comorbid conditions, and  other considerations. . Currently, no consensus exists regarding use of hemoglobin A1c for diagnosis of diabetes for children. .     CBG: Recent Labs  Lab Mar 28, 2018 1135 2018/03/28 1728 03-28-2018 2040 03/01/18 0748 03/01/18 1224  GLUCAP 173* 225* 232* 316* 186*    Recent Results (from the past 240 hour(s))  Blood culture (routine x 2)     Status: None (Preliminary result)   Collection Time: 02/11/2018  8:43 AM  Result Value Ref Range Status   Specimen Description BLOOD RIGHT ANTECUBITAL  Final   Special Requests   Final    IN BOTH AEROBIC AND ANAEROBIC BOTTLES Blood Culture results may not be optimal due to an excessive volume of blood received in culture bottles   Culture   Final    NO GROWTH 2  DAYS Performed at [facility], [address], [place], [place] [address]    Report Status PENDING  Incomplete  Blood culture (routine x 2)     Status: None (Preliminary result)   Collection Time: 02/03/2018  9:35 AM  Result Value Ref Range Status   Specimen Description BLOOD BLOOD RIGHT FOREARM  Final   Special Requests   Final    IN BOTH AEROBIC AND ANAEROBIC BOTTLES Blood Culture  adequate volume   Culture   Final    NO GROWTH 2 DAYS Performed at [facility], [address], [place], [place] [address]    Report Status PENDING  Incomplete  MRSA PCR Screening     Status: None   Collection Time: 02/06/2018  4:01 PM  Result Value Ref Range Status   MRSA by PCR NEGATIVE NEGATIVE Final    Comment:        The GeneXpert MRSA Assay (FDA approved for NASAL specimens only), is one component of a comprehensive MRSA colonization surveillance program. It is not intended to diagnose MRSA infection nor to guide or monitor treatment for MRSA infections. Performed at [facility], [address] [place], [place] [address]   Respiratory Panel by PCR     Status: None   Collection Time: 02/11/2018  9:37 PM  Result Value Ref Range Status   Adenovirus NOT DETECTED NOT DETECTED Final   Coronavirus 229E NOT DETECTED NOT DETECTED Final   Coronavirus HKU1 NOT DETECTED NOT DETECTED Final   Coronavirus NL63 NOT DETECTED NOT DETECTED Final   Coronavirus OC43 NOT DETECTED NOT DETECTED Final   Metapneumovirus NOT DETECTED NOT DETECTED Final   Rhinovirus / Enterovirus NOT DETECTED NOT DETECTED Final   Influenza A NOT DETECTED NOT DETECTED Final   Influenza B NOT DETECTED NOT DETECTED Final   Parainfluenza Virus 1 NOT DETECTED NOT DETECTED Final   Parainfluenza Virus 2 NOT DETECTED NOT DETECTED Final   Parainfluenza Virus 3 NOT DETECTED NOT DETECTED Final   Parainfluenza Virus 4 NOT DETECTED NOT DETECTED Final   Respiratory Syncytial Virus NOT DETECTED NOT DETECTED Final   Bordetella  pertussis NOT DETECTED NOT DETECTED Final   Chlamydophila pneumoniae NOT DETECTED NOT DETECTED Final   Mycoplasma pneumoniae NOT DETECTED NOT DETECTED Final    Comment: Performed at [facility], [address] [place], [place] [address]  Culture, sputum-assessment     Status: None   Collection Time: 02/10/2018 10:33 PM  Result Value Ref Range Status   Specimen Description SPUTUM  Final   Special Requests NONE  Final   Sputum evaluation   Final    Sputum specimen not acceptable for testing.  Please recollect.   Gram Stain Report Called to,Read Back By and Verified With: RN Foye Deer [phone] MLM Performed at [facility], [address], [place], [place] [address]    Report Status 03/01/2018 FINAL  Final  Culture, Urine     Status: Abnormal (Preliminary result)   Collection Time: 02/22/2018 11:08 PM  Result Value Ref Range Status   Specimen Description URINE, RANDOM  Final   Special Requests   Final    NONE Performed at [facility], [address], [place], [place] [address]    Culture 10,000 COLONIES/mL ENTEROCOCCUS FAECALIS (A)  Final   Report Status PENDING  Incomplete     Scheduled Meds: . amiodarone  200 mg Oral Daily  . aspirin EC  81 mg Oral Daily  . atorvastatin  40 mg Oral q1800  . insulin aspart  0-5 Units Subcutaneous QHS  . insulin aspart  0-9 Units Subcutaneous TID WC  . levothyroxine  88 mcg Oral QAC breakfast  . potassium chloride  40 mEq Oral Once  . Warfarin - Pharmacist Dosing Inpatient   Does not apply q1800     LOS: 2 days   Lonia Blood, MD [facility] Office  [phone] Pager - Text  Page per Loretha Stapler as per below:  On-Call/Text Page:      [email or web address]      password [email or web address]  If 7PM-7AM, please contact night-coverage [email or web address] Password [email or web address] 03/01/2018, 1:26 PM

## 2018-03-01 NOTE — Progress Notes (Addendum)
Pt continues to complain of mask and wanting to eat. This RN and respiratory tech both attempted to take pt off Bipap and put pt on HFNC. Pt was unable to tolerate HFNC at 15L and O2 saturations dropped into the low 70s both attempts. Pt placed back on Bipap and this RN paged Dr. Sharon Seller to make him aware of pt status. Will continue to monitor pt.

## 2018-03-01 NOTE — Progress Notes (Signed)
Subjective: Interval History: has no complaint, still coughing up thick phlegm.  Objective: Vital signs in last 24 hours: Temp:  [97.3 F (36.3 C)-98.7 F (37.1 C)] 98.7 F (37.1 C) (04/30 0748) Pulse Rate:  [39-102] 89 (04/30 0748) Resp:  [20-28] 20 (04/30 0748) BP: (106-141)/(52-89) 123/59 (04/30 0748) SpO2:  [85 %-100 %] 98 % (04/30 0753) Weight:  [71.2 kg (156 lb 15.5 oz)-72.3 kg (159 lb 6.3 oz)] 71.2 kg (156 lb 15.5 oz) (04/30 0501) Weight change: -0.3 kg (-10.6 oz)  Intake/Output from previous day: 04/29 0701 - 04/30 0700 In: 237.5 [P.O.:100; I.V.:37.5; IV Piggyback:100] Out: 1350 [Urine:1000] Intake/Output this shift: No intake/output data recorded.  General appearance: alert, cooperative, pale and on CPAP Resp: rales bibasilar Cardio: S1, S2 normal and systolic murmur: late systolic 2/6, decrescendo at 2nd left intercostal space GI: soft, non-tender; bowel sounds normal; no masses,  no organomegaly Extremities: extremities normal, atraumatic, no cyanosis or edema  Lab Results: Recent Labs    2018-03-02 0054 03/01/18 0233  WBC 13.8* 11.8*  HGB 9.3* 9.0*  HCT 28.6* 27.4*  PLT 188 199   BMET:  Recent Labs    03/02/2018 0054 03/01/18 0233  NA 139 139  K 4.9 3.2*  CL 105 106  CO2 20* 19*  GLUCOSE 197* 274*  BUN 91* 93*  CREATININE 3.55* 3.19*  CALCIUM 8.1* 7.8*   Recent Labs    2018-03-02 1035  PTH 69*   Iron Studies:  Recent Labs    2018-03-02 1035  IRON 44*  TIBC 155*    Studies/Results: Ct Chest Wo Contrast  Result Date: 02/26/2018 CLINICAL DATA:  Respiratory distress. EXAM: CT CHEST WITHOUT CONTRAST TECHNIQUE: Multidetector CT imaging of the chest was performed following the standard protocol without IV contrast. COMPARISON:  Chest x-ray February 27, 2018.  Chest CT December 05, 2004 FINDINGS: Cardiovascular: Coronary artery calcifications. Mild cardiomegaly. Atherosclerotic change in the nonaneurysmal aorta. Central pulmonary arteries are normal in  caliber. Mediastinum/Nodes: Shotty and mildly enlarged lymph nodes are identified in the mediastinum. A pre tracheal node measuring 15 mm is seen on series 3, image 59. Pleural effusions. No pericardial effusion. The thyroid and esophagus are unremarkable. Lungs/Pleura: Central airways are normal. No pneumothorax. Bilateral pleural effusions with atelectasis, right greater than left. Ground-glass opacities are identified in the lungs, affecting all lobes but most pronounced in the upper lobes. There is also some interlobular thickening in the upper lobes. There are regions of more focal consolidation as well such as in the right lower lobe on image 101 in the right upper lobe on image 70. Upper Abdomen: Bilateral renal cysts. No other acute abnormalities on limited views of the upper abdomen. Musculoskeletal: No chest wall mass or suspicious bone lesions identified. IMPRESSION: 1. Diffuse bilateral pulmonary opacities. There are large ground-glass components particularly in the upper lobes and a few regions of more focal opacity in the right upper and lower lobes. The findings could represent either edema or infection. I suspect a large component of edema. That being said, the more focal regions of infiltrate on the right could represent a superimposed pneumonia. Recommend follow-up to resolution. 2. Coronary artery calcifications. Atherosclerotic changes in the nonaneurysmal aorta. 3. Shotty and mildly enlarged mediastinal nodes are probably reactive. 4. No other abnormalities. Aortic Atherosclerosis (ICD10-I70.0). Electronically Signed   By: Gerome Sam III M.D   On: 02/07/2018 12:56   US Renal  Result Date: 02/16/2018 CLINICAL DATA:  Acute kidney injury. EXAM: RENAL / URINARY TRACT ULTRASOUND COMPLETE COMPARISON:  CT, 03/21/2013. FINDINGS: Right Kidney: Length: 13.5 cm. Diffuse cortical thinning. Multiple cysts. From the lower pole, there is a 5.8 x 5.1 x 4.9 cm cyst. There is a somewhat complicated  appearing cystic mass arising from the upper pole measuring 2.5 x 1.8 x 2.6 cm. No stones. No hydronephrosis. Left Kidney: Length: 18.3 cm. Renal parenchyma not well visualized. There are numerous cysts. Largest arises from the region of the mid right kidney measuring 8.3 x 6.0 x 7.3 cm. No solid masses. No stones. No hydronephrosis. Bladder: Appears normal for degree of bladder distention. IMPRESSION: 1. Bilateral renal cysts, more numerous on the left. On the left this obscures the renal parenchyma. 1 cyst on the right is mildly complicated, arising from the upper pole. Recommend follow-up of this cyst with ultrasound to document stability, in 4-6 months. 2. Thinned renal parenchyma on the right. 3. No hydronephrosis. Electronically Signed   By: Amie Portland M.D.   On: Mar 05, 2018 17:13   Dg Chest Port 1 View  Result Date: 03/01/2018 CLINICAL DATA:  Tachypnea with bibasilar crackles EXAM: PORTABLE CHEST 1 VIEW COMPARISON:  CT Mar 05, 2018, 2018-03-05, 02/25/2018, 02/09/2018 radiograph FINDINGS: Moderate to marked diffuse bilateral ground-glass opacities with more focal consolidations at the left base and right upper lobe. Enlarged cardiomediastinal silhouette with aortic atherosclerosis. No pneumothorax. IMPRESSION: Slightly worsened diffuse bilateral ground-glass opacities with more focal consolidations in the right upper lobe and left base, findings could reflect underlying pulmonary edema with possible superimposed pneumonia. Electronically Signed   By: Jasmine Pang M.D.   On: 02/08/2018 23:19    I have reviewed the patient's current medications.  Assessment/Plan: 1 AKI improving slowly. Mild acidemia 2Low K Not sure why given diuretics, will give K 3 Pneu improving  4Afib rate controlled, anticoag 5 AS 6 hypothyroid P aB, follow chem , avoid diuretics at this point  LOS: 2 days   Fayrene Fearing Franciszek Platten 03/01/2018,8:24 AM

## 2018-03-01 NOTE — Progress Notes (Signed)
Dr. Donnamarie Poag ordered a dose of lasix.  It was administered and a condom cath was placed on the patient.

## 2018-03-01 NOTE — Progress Notes (Signed)
CRITICAL VALUE ALERT  Critical Value:  Lactic Acid 2.2  Date & Time Notied:  03/01/2018 at 0025  Provider Notified: Donnamarie Poag - Paged  Orders Received/Actions taken: Currently no instructions given.

## 2018-03-01 NOTE — Progress Notes (Signed)
Pt off NIV desat yo low 70s on 8L HFNC, increased to 15L with no change, returned to NIV, wean FIO2 as able

## 2018-03-01 NOTE — Progress Notes (Signed)
CRITICAL VALUE ALERT  Critical Value:  INR 4.38  Date & Time Notied:  03/01/18 @ 0315  Provider Notified: Donnamarie Poag  Orders Received/Actions taken: Currently no orders given.

## 2018-03-02 ENCOUNTER — Inpatient Hospital Stay (HOSPITAL_COMMUNITY): Payer: Medicare Other

## 2018-03-02 DIAGNOSIS — I481 Persistent atrial fibrillation: Secondary | ICD-10-CM

## 2018-03-02 DIAGNOSIS — I350 Nonrheumatic aortic (valve) stenosis: Secondary | ICD-10-CM

## 2018-03-02 DIAGNOSIS — I10 Essential (primary) hypertension: Secondary | ICD-10-CM

## 2018-03-02 LAB — RENAL FUNCTION PANEL
ALBUMIN: 2.1 g/dL — AB (ref 3.5–5.0)
Anion gap: 16 — ABNORMAL HIGH (ref 5–15)
BUN: 90 mg/dL — AB (ref 6–20)
CHLORIDE: 110 mmol/L (ref 101–111)
CO2: 19 mmol/L — ABNORMAL LOW (ref 22–32)
CREATININE: 2.6 mg/dL — AB (ref 0.61–1.24)
Calcium: 8 mg/dL — ABNORMAL LOW (ref 8.9–10.3)
GFR calc Af Amer: 23 mL/min — ABNORMAL LOW (ref >60)
GFR, EST NON AFRICAN AMERICAN: 20 mL/min — AB (ref >60)
GLUCOSE: 271 mg/dL — AB (ref 65–99)
POTASSIUM: 3.5 mmol/L (ref 3.5–5.1)
Phosphorus: 4.9 mg/dL — ABNORMAL HIGH (ref 2.5–4.6)
Sodium: 145 mmol/L (ref 135–145)

## 2018-03-02 LAB — BLOOD GAS, ARTERIAL
ACID-BASE DEFICIT: 5.3 mmol/L — AB (ref 0.0–2.0)
Bicarbonate: 19 mmol/L — ABNORMAL LOW (ref 20.0–28.0)
DELIVERY SYSTEMS: POSITIVE
DRAWN BY: 511471
Expiratory PAP: 6
FIO2: 50
INSPIRATORY PAP: 10
O2 Saturation: 98.8 %
Patient temperature: 98.6
pCO2 arterial: 33.4 mmHg (ref 32.0–48.0)
pH, Arterial: 7.372 (ref 7.350–7.450)
pO2, Arterial: 164 mmHg — ABNORMAL HIGH (ref 83.0–108.0)

## 2018-03-02 LAB — URINE CULTURE: Culture: 10000 — AB

## 2018-03-02 LAB — CBC
HEMATOCRIT: 28.5 % — AB (ref 39.0–52.0)
HEMOGLOBIN: 9.2 g/dL — AB (ref 13.0–17.0)
MCH: 31.1 pg (ref 26.0–34.0)
MCHC: 32.3 g/dL (ref 30.0–36.0)
MCV: 96.3 fL (ref 78.0–100.0)
Platelets: 233 10*3/uL (ref 150–400)
RBC: 2.96 MIL/uL — ABNORMAL LOW (ref 4.22–5.81)
RDW: 15.2 % (ref 11.5–15.5)
WBC: 8.8 10*3/uL (ref 4.0–10.5)

## 2018-03-02 LAB — GLUCOSE, CAPILLARY
GLUCOSE-CAPILLARY: 294 mg/dL — AB (ref 65–99)
GLUCOSE-CAPILLARY: 311 mg/dL — AB (ref 65–99)
Glucose-Capillary: 282 mg/dL — ABNORMAL HIGH (ref 65–99)
Glucose-Capillary: 332 mg/dL — ABNORMAL HIGH (ref 65–99)
Glucose-Capillary: 266 mg/dL — ABNORMAL HIGH (ref 65–99)

## 2018-03-02 LAB — PROTIME-INR
INR: 4.38
INR: 5.31 — AB
PROTHROMBIN TIME: 48.3 s — AB (ref 11.4–15.2)
Prothrombin Time: 41.6 seconds — ABNORMAL HIGH (ref 11.4–15.2)

## 2018-03-02 MED ORDER — AMIODARONE HCL 200 MG PO TABS: 200.0000 mg | ORAL_TABLET | Freq: Once | ORAL | Status: DC

## 2018-03-02 MED ORDER — LEVALBUTEROL HCL 1.25 MG/0.5ML IN NEBU
1.2500 mg | INHALATION_SOLUTION | Freq: Four times a day (QID) | RESPIRATORY_TRACT | Status: DC
  Administered 2018-03-02 – 2018-03-04 (×9): 1.25 mg via RESPIRATORY_TRACT
  Filled 2018-03-02 (×10): qty 0.5

## 2018-03-02 MED ORDER — ALPRAZOLAM 0.5 MG PO TABS
0.5000 mg | ORAL_TABLET | Freq: Three times a day (TID) | ORAL | Status: DC | PRN
  Administered 2018-03-04: 0.5 mg via ORAL
  Filled 2018-03-02: qty 1

## 2018-03-02 MED ORDER — METHYLPREDNISOLONE SODIUM SUCC 40 MG IJ SOLR
40.0000 mg | Freq: Four times a day (QID) | INTRAMUSCULAR | Status: DC
  Administered 2018-03-02 – 2018-03-04 (×7): 40 mg via INTRAVENOUS
  Filled 2018-03-02 (×7): qty 1

## 2018-03-02 MED ORDER — AMIODARONE HCL IN DEXTROSE 360-4.14 MG/200ML-% IV SOLN
60.0000 mg/h | INTRAVENOUS | Status: AC
  Administered 2018-03-02 (×2): 60 mg/h via INTRAVENOUS
  Filled 2018-03-02: qty 200

## 2018-03-02 MED ORDER — HALOPERIDOL LACTATE 5 MG/ML IJ SOLN
2.0000 mg | Freq: Four times a day (QID) | INTRAMUSCULAR | Status: DC | PRN
  Administered 2018-03-03 (×2): 2 mg via INTRAVENOUS
  Filled 2018-03-02 (×2): qty 1

## 2018-03-02 MED ORDER — AMIODARONE HCL 200 MG PO TABS: 200.0000 mg | ORAL_TABLET | Freq: Two times a day (BID) | ORAL | Status: DC

## 2018-03-02 MED ORDER — GUAIFENESIN-DM 100-10 MG/5ML PO SYRP
10.0000 mL | ORAL_SOLUTION | Freq: Four times a day (QID) | ORAL | Status: DC
  Filled 2018-03-02: qty 10

## 2018-03-02 MED ORDER — AMIODARONE LOAD VIA INFUSION
150.0000 mg | Freq: Once | INTRAVENOUS | Status: AC
  Administered 2018-03-02: 150 mg via INTRAVENOUS
  Filled 2018-03-02: qty 83.34

## 2018-03-02 MED ORDER — LEVALBUTEROL HCL 1.25 MG/0.5ML IN NEBU: 1.2500 mg | INHALATION_SOLUTION | Freq: Four times a day (QID) | RESPIRATORY_TRACT | Status: DC

## 2018-03-02 MED ORDER — FUROSEMIDE 10 MG/ML IJ SOLN
20.0000 mg | Freq: Once | INTRAMUSCULAR | Status: AC
  Administered 2018-03-02: 20 mg via INTRAVENOUS
  Filled 2018-03-02: qty 2

## 2018-03-02 MED ORDER — INSULIN ASPART 100 UNIT/ML ~~LOC~~ SOLN
0.0000 [IU] | Freq: Three times a day (TID) | SUBCUTANEOUS | Status: DC
  Administered 2018-03-02: 7 [IU] via SUBCUTANEOUS
  Administered 2018-03-02: 5 [IU] via SUBCUTANEOUS
  Administered 2018-03-02: 7 [IU] via SUBCUTANEOUS
  Administered 2018-03-03 (×2): 9 [IU] via SUBCUTANEOUS
  Administered 2018-03-03 – 2018-03-04 (×2): 7 [IU] via SUBCUTANEOUS
  Administered 2018-03-04 (×2): 9 [IU] via SUBCUTANEOUS

## 2018-03-02 MED ORDER — AMIODARONE HCL IN DEXTROSE 360-4.14 MG/200ML-% IV SOLN
30.0000 mg/h | INTRAVENOUS | Status: DC
  Administered 2018-03-02 – 2018-03-04 (×4): 30 mg/h via INTRAVENOUS
  Filled 2018-03-02 (×5): qty 200

## 2018-03-02 NOTE — Progress Notes (Signed)
This RN and RT attempted twice today to take pt off Bipap to give pt a break. Attempted this morning around 1000 to put pt on high flow nasal cannula and pt was unable to keep O2 saturations up and O2 saturation dropped to the 70s. Pt was placed back on Bipap. Attempted to place pt on non-rebreather mask and pt was able to maintain O2 saturation, but work of breathing increased and respirations were in the 40s. Pt placed back on Bipap. RN notifed Dr. Flossie Dibble that pt has not been able to come off Bipap. Will continue to monitor pt.

## 2018-03-02 NOTE — Progress Notes (Signed)
Progress Note  Patient Name: Adam [name] Date of Encounter: 03/02/2018  Primary Cardiologist: No primary care provider on file.   Subjective   Remains in atrial fibrillation with RVR in the 110's.  Currently on BiPAP and has not been able to come off due to persistent hypoxia.  Has not been able to get PO meds.   Inpatient Medications    Scheduled Meds: . amiodarone  200 mg Oral Daily  . insulin aspart  0-5 Units Subcutaneous QHS  . insulin aspart  0-9 Units Subcutaneous TID WC  . levothyroxine  88 mcg Oral QAC breakfast  . methylPREDNISolone (SOLU-MEDROL) injection  60 mg Intravenous Q12H  . Warfarin - Pharmacist Dosing Inpatient   Does not apply q1800   Continuous Infusions: . sodium chloride 10 mL/hr at 03/02/18 0155  . ceFEPime (MAXIPIME) IV 1 g (03/02/18 1054)   PRN Meds: acetaminophen, acetaminophen, benzonatate, ondansetron (ZOFRAN) IV   Vital Signs    Vitals:   03/02/18 0831 03/02/18 0907 03/02/18 0908 03/02/18 1131  BP: (!) 151/91 (!) 145/87 (!) 145/87 (!) 144/63  Pulse: 90 (!) 101 (!) 105 86  Resp: 18 (!) 24 (!) 24 (!) 24  Temp: 97.7 F (36.5 C)   97.9 F (36.6 C)  TempSrc: Oral   Oral  SpO2: 100% 98% 100% 98%  Weight:      Height:        Intake/Output Summary (Last 24 hours) at 03/02/2018 1143 Last data filed at 03/02/2018 1043 Gross per 24 hour  Intake 1373.67 ml  Output 2675 ml  Net -1301.33 ml   Filed Weights   03/01/18 0300 03/01/18 0501 03/02/18 0350  Weight: 159 lb 6.3 oz (72.3 kg) 156 lb 15.5 oz (71.2 kg) 156 lb 8.4 oz (71 kg)    Telemetry  Atrial fibrillation with RVR - Personally Reviewed  ECG    No new EKG to review - Personally Reviewed  Physical Exam   WUJ:WJXBJYN on BiPAP Neck: No JVD Cardiac: irregulalry irregular and tachy, no murmurs, rubs, or gallops.  Respiratory: Clear to auscultation bilaterally. GI: Soft, nontender, non-distended  MS: No edema; No deformity. Neuro:  Nonfocal  Psych: Normal affect   Labs      Chemistry Recent Labs  Lab 02/25/18 1053  03/01/2018 0054 03/01/18 0233 03/02/18 0400  NA 137   < > 139 139 145  K 5.7*   < > 4.9 3.2* 3.5  CL 103   < > 105 106 110  CO2 19*   < > 20* 19* 19*  GLUCOSE 285*   < > 197* 274* 271*  BUN 39*   < > 91* 93* 90*  CREATININE 2.20*   < > 3.55* 3.19* 2.60*  CALCIUM 8.5*   < > 8.1* 7.8* 8.0*  PROT 6.8  --   --   --   --   ALBUMIN  --   --   --  2.1* 2.1*  AST 15  --   --   --   --   ALT 11  --   --   --   --   BILITOT 1.7*  --   --   --   --   GFRNONAA 25*   < > 14* 16* 20*  GFRAA 29*   < > 16* 18* 23*  ANIONGAP  --    < > 14 14 16*   < > = values in this interval not displayed.     Hematology Recent Labs  Lab Mar 12, 2018 0054 03/01/18 0233 03/02/18 0400  WBC 13.8* 11.8* 8.8  RBC 2.96* 2.88* 2.96*  HGB 9.3* 9.0* 9.2*  HCT 28.6* 27.4* 28.5*  MCV 96.6 95.1 96.3  MCH 31.4 31.3 31.1  MCHC 32.5 32.8 32.3  RDW 14.7 14.7 15.2  PLT 188 199 233    Cardiac Enzymes Recent Labs  Lab 12-Mar-2018 0616 2018-03-12 1035 03/12/18 1649 Mar 12, 2018 2320  TROPONINI 5.54* 4.88* 3.83* 2.78*    Recent Labs  Lab 02/10/2018 0612  TROPIPOC 4.13*     BNP Recent Labs  Lab 02/19/2018 0607  BNP 1,044.6*     DDimer No results for input(s): DDIMER in the last 168 hours.   Radiology    Dg Chest Port 1 View  Result Date: 03/02/2018 CLINICAL DATA:  Pneumonia EXAM: PORTABLE CHEST 1 VIEW COMPARISON:  2018-03-12 FINDINGS: Improvement in bilateral airspace disease. Probable clearing edema. Cardiac enlargement. Bibasilar atelectasis/infiltrate left greater than right and small effusions IMPRESSION: Improving bilateral airspace disease most compatible with partial clearing of pulmonary edema. Electronically Signed   By: Marlan Palau M.D.   On: 03/02/2018 10:26   Dg Chest Port 1 View  Result Date: 12-Mar-2018 CLINICAL DATA:  Tachypnea with bibasilar crackles EXAM: PORTABLE CHEST 1 VIEW COMPARISON:  CT 02/08/2018, 02/18/2018, 02/25/2018, 02/09/2018 radiograph  FINDINGS: Moderate to marked diffuse bilateral ground-glass opacities with more focal consolidations at the left base and right upper lobe. Enlarged cardiomediastinal silhouette with aortic atherosclerosis. No pneumothorax. IMPRESSION: Slightly worsened diffuse bilateral ground-glass opacities with more focal consolidations in the right upper lobe and left base, findings could reflect underlying pulmonary edema with possible superimposed pneumonia. Electronically Signed   By: Jasmine Pang M.D.   On: 03-12-2018 23:19    Cardiac Studies   Echo 01/31/2018: Study Conclusions  - Left ventricle: The cavity size was normal. Wall thickness was increased in a pattern of moderate LVH. Systolic function was normal. The estimated ejection fraction was in the range of 60% to 65%. The study is not technically sufficient to allow evaluation of LV diastolic function. - Aortic valve: There was moderate stenosis. There was mild regurgitation. - Mitral valve: Small diastolic gradient but MVA above 2.0 cm 2. Severely calcified annulus. Severely thickened, severely calcified leaflets . - Left atrium: The atrium was moderately dilated. - Atrial septum: No defect or patent foramen ovale was identified.   Patient Profile     84 y.o. male  with history of paroxysmal atrial fibrillation who is now admitted with acute respiratory failure felt to be multifactorial from community-acquired pneumonia, acute on chronic diastolic heart failure, and possibly precipitated by atrial fibrillation  Assessment & Plan    1.  Paroxysmal atrial fibrillation -was in NSR yesterday but appears back in afib/flutter with RVR and HR 110's on tele today -check 12 lead EKG today -has not been able to get PO Amio due to hypoxia and need for BIPAP -change PO Amio to IV -continue warfarin - INR supra therapeutic at 5.31 - pharmacy managing  2.  Acute on chronic kidney injury -renal managing -creatinine down from  3.19 to 2.6 today  3.  Acute on chronic respiratory failure secondary to acute pulmonary edema -cxray this am shows improvement but still hypoxic requiring BIPAP -diuresis per renal - he has diuresed 2.1L yesterday and is net neg 3.1L  4.  Elevated troponin  -likely related to demand ischemia in setting of respiratory failure and atrial fib with RVR -Not a cath candidate given advanced age and chronic kidney disease.  -  Will continue ASA and statin.   5.  HTN  -BP elevated  -cannot use ACE I/ARB due to acute on CKD -per TRH  6.  Moderate AS - not a surgical candidate due to advanced age and CKD    For questions or updates, please contact [facility] Please consult [email or web address] for contact info under Cardiology/STEMI.      Signed, Armanda Magic, MD  03/02/2018, 11:43 AM

## 2018-03-02 NOTE — Progress Notes (Signed)
CRITICAL VALUE ALERT  Critical Value:  PT/INR 48.3/5.31  Date & Time Notied:  03/02/18 1610  Provider Notified: Donnamarie Poag NP  Orders Received/Actions taken: Will continue to monitor. See Epic for any orders

## 2018-03-02 NOTE — Progress Notes (Signed)
Subjective: Interval History: has no complaint.  Objective: Vital signs in last 24 hours: Temp:  [98.1 F (36.7 C)-98.7 F (37.1 C)] 98.6 F (37 C) (05/01 0350) Pulse Rate:  [44-107] 100 (05/01 0616) Resp:  [20-35] 23 (05/01 0616) BP: (106-141)/(56-95) 133/88 (05/01 0616) SpO2:  [95 %-100 %] 100 % (05/01 0616) FiO2 (%):  [50 %] 50 % (04/30 2333) Weight:  [71 kg (156 lb 8.4 oz)] 71 kg (156 lb 8.4 oz) (05/01 0350) Weight change: -1.3 kg (-2 lb 13.9 oz)  Intake/Output from previous day: 04/30 0701 - 05/01 0700 In: 1473.7 [I.V.:1073.7; IV Piggyback:400] Out: 2100 [Urine:2100] Intake/Output this shift: No intake/output data recorded.  General appearance: slowed mentation and lethargic , difficult to arouse, on BIPAP Resp: rales LLL Cardio: irregularly irregular rhythm, S1, S2 normal and systolic murmur: systolic ejection 2/6, decrescendo at 2nd left intercostal space GI: soft, non-tender; bowel sounds normal; no masses,  no organomegaly Extremities: extremities normal, atraumatic, no cyanosis or edema  Lab Results: Recent Labs    03/01/18 0233 03/02/18 0400  WBC 11.8* 8.8  HGB 9.0* 9.2*  HCT 27.4* 28.5*  PLT 199 233   BMET:  Recent Labs    03/01/18 0233 03/02/18 0400  NA 139 145  K 3.2* 3.5  CL 106 110  CO2 19* 19*  GLUCOSE 274* 271*  BUN 93* 90*  CREATININE 3.19* 2.60*  CALCIUM 7.8* 8.0*   Recent Labs    03-11-2018 1035  PTH 69*   Iron Studies:  Recent Labs    2018/03/11 1035  IRON 44*  TIBC 155*    Studies/Results: Dg Chest Port 1 View  Result Date: 03-11-18 CLINICAL DATA:  Tachypnea with bibasilar crackles EXAM: PORTABLE CHEST 1 VIEW COMPARISON:  CT 02/17/2018, 02/21/2018, 02/25/2018, 02/09/2018 radiograph FINDINGS: Moderate to marked diffuse bilateral ground-glass opacities with more focal consolidations at the left base and right upper lobe. Enlarged cardiomediastinal silhouette with aortic atherosclerosis. No pneumothorax. IMPRESSION: Slightly  worsened diffuse bilateral ground-glass opacities with more focal consolidations in the right upper lobe and left base, findings could reflect underlying pulmonary edema with possible superimposed pneumonia. Electronically Signed   By: Jasmine Pang M.D.   On: 03/11/2018 23:19    I have reviewed the patient's current medications.  Assessment/Plan: 1 AKI/CKD4 improving, mild acidemia, diuresing on his own. 2 Pneu  Improving 3 Anemia stable 4 Afib rate controlled and anticoag 5 CAD 6 DM controlled 7 AS P AB , BIPAP as needed, supportive care   LOS: 3 days   Kadden Rexanne Inocencio 03/02/2018,7:46 AM

## 2018-03-02 NOTE — Plan of Care (Signed)
Discussed with patient and family plan of care for the evening, pain management and importance of wearing the Bipap throughout the night with some teach back displayed.

## 2018-03-02 NOTE — Progress Notes (Signed)
RT and RN attempted to give patient a break from bipap and placed patient on non-rebreather mask. Patients sats remained stable but patient's WOB increased and RR went into the high 30's. Patient had to be placed back on bipap. Vitals are now stable and sats are 99%. RT will continue to monitor.

## 2018-03-02 NOTE — Progress Notes (Addendum)
[place] TEAM 1 - Stepdown/ICU TEAM  Adam [name]  ZOX:[id number] DOB: 1923-02-14 DOA: 02/08/2018 PCP: Lucky Cowboy, MD    Brief Narrative:  84 year old male with a hx of CAD, AoS with a valve area of 1 but peak gradient of 35, atrial fibrillation on Coumadin, and CKD stage IV who presented w/ 3 days of shaking chills and shortness of breath. Patient was seen by his PCP and started on doxycycline.   In the ED he was found to have a leukocytosis, GFR of 17, and a CXR noting perihilar infiltrates suggestive of pneumonia versus edema.  EKG reveals atrial fibrillation at 100.  Troponin was 4.   Significant Events: 4/28 admit  Subjective:  Patient was seen and examined this morning, has been on BiPAP since yesterday. Per nursing staff patient desats quickly once the taken off BiPAP or trying to wean off this time. Patient is otherwise awake alert oriented, communicating by writing stating that he has lost his voice. Wife present at bedside.    Assessment & Plan:   Acute respiratory failure -Hypoxia likely exacerbated with severe sepsis, multilobar pneumonia -Continue BiPAP -Continue antibiotics -We will increase Solu-Medrol frequency 40 mg q. 6, added Xopenex DuoNeb bronchodilator treatment (alternative to albuterol as patient has a history of atrial fibrillation with RVR, to minimize tachycardia)  Severe Sepsis and acute hypoxic respiratory failure due to Multilobar Pneumonia Cont empiric abx and follow clinically - wean off BIPAP as able  -Continue antibiotics of cefepime  NSTEMI Not a candidate for cardiac cath due to obvious reasons - Cards directing medical care   AKI on CKD stage IV Baseline creatinine approximately 2 - keep hydrated and follow  Recent Labs  Lab 02/25/18 1053 02/10/2018 0607 Mar 07, 2018 0054 03/01/18 0233 03/02/18 0400  CREATININE 2.20* 3.03* 3.55* 3.19* 2.60*    Chronic diastolic CHF TTE 06/02/18 noted EF 60-65% - appears volume depleted at  this time - gently hydrate - follow weights   Filed Weights   03/01/18 0300 03/01/18 0501 03/02/18 0350  Weight: 72.3 kg (159 lb 6.3 oz) 71.2 kg (156 lb 15.5 oz) 71 kg (156 lb 8.4 oz)    Moderate aortic stenosis -Trend closely,  -cardiology following  Parox atrial fibrillation with acute RVR Chadsvasc is 5 - on chronic warfarin - Cards following - on amio  DM 2 CBG reasonably controlled - follow w/o change for now   HTN BP currently controlled  HLD  DVT prophylaxis: warfarin  Code Status: DNR - NO CODE Family Communication: no family present at time of exam  Disposition Plan:   Consultants:  Nephrology Cardiology   Antimicrobials:  Cefepime 4/28 > Vancomycin 4/28 > 4/29  Objective: Blood pressure (!) 148/98, pulse (!) 109, temperature 97.9 F (36.6 C), temperature source Oral, resp. rate (!) 25, height  (1.702 m), weight 71 kg (156 lb 8.4 oz), SpO2 99 %.  Intake/Output Summary (Last 24 hours) at 03/02/2018 1305 Last data filed at 03/02/2018 1043 Gross per 24 hour  Intake 1373.67 ml  Output 1875 ml  Net -501.33 ml   Filed Weights   03/01/18 0300 03/01/18 0501 03/02/18 0350  Weight: 72.3 kg (159 lb 6.3 oz) 71.2 kg (156 lb 15.5 oz) 71 kg (156 lb 8.4 oz)    Examination: General: Awake alert, reporting that he has lost his voice, on BiPAP Lungs: Diffuse rhonchi, mild wheezing, subtle crackles possibly mildly had lower lobes Cardiovascular: Irregularly irregular mildly tacky S1-S2, no murmurs Abdomen: Soft nontender nondistended positive bowel sounds  Extremities: No significant cyanosis, clubbing, or edema bilateral lower extremities  CBC: Recent Labs  Lab 02/24/18 1116 02/25/18 1053 02/21/2018 0607 02/07/2018 0054 03/01/18 0233 03/02/18 0400  WBC 9.5 12.1* 16.4* 13.8* 11.8* 8.8  NEUTROABS 7,486 9,825* 13.6*  --   --   --   HGB 9.4* 9.4* 9.7* 9.3* 9.0* 9.2*  HCT 27.9* 28.0* 31.7* 28.6* 27.4* 28.5*  MCV 94.6 95.9 101.9* 96.6 95.1 96.3  PLT 222 218 235 188  199 233   Basic Metabolic Panel: Recent Labs  Lab 02/06/2018 0054 03/01/18 0233 03/02/18 0400  NA 139 139 145  K 4.9 3.2* 3.5  CL 105 106 110  CO2 20* 19* 19*  GLUCOSE 197* 274* 271*  BUN 91* 93* 90*  CREATININE 3.55* 3.19* 2.60*  CALCIUM 8.1* 7.8* 8.0*  PHOS  --  4.5 4.9*   GFR: Estimated Creatinine Clearance: 16.9 mL/min (A) (by C-G formula based on SCr of 2.6 mg/dL (H)).  Liver Function Tests: Recent Labs  Lab 02/25/18 1053 03/01/18 0233 03/02/18 0400  AST 15  --   --   ALT 11  --   --   BILITOT 1.7*  --   --   PROT 6.8  --   --   ALBUMIN  --  2.1* 2.1*   No results for input(s): LIPASE, AMYLASE in the last 168 hours. No results for input(s): AMMONIA in the last 168 hours.  Coagulation Profile: Recent Labs  Lab 02/12/2018 0607 02/20/2018 0054 03/01/18 0233 03/02/18 0400  INR 3.09 3.15 4.38* 5.31*    Cardiac Enzymes: Recent Labs  Lab 02/26/2018 2031 02/04/2018 0616 02/11/2018 1035 02/20/2018 1649 02/26/2018 2320  TROPONINI 9.13* 5.54* 4.88* 3.83* 2.78*    HbA1C: Hgb A1c MFr Bld  Date/Time Value Ref Range Status  02/24/2018 09:35 AM 6.9 (H) 4.8 - 5.6 % Final    Comment:    (NOTE) Pre diabetes:          5.7%-6.4% Diabetes:              >6.4% Glycemic control for   <7.0% adults with diabetes   02/08/2018 05:03 PM 7.1 (H) <5.7 % of total Hgb Final    Comment:    For someone without known diabetes, a hemoglobin A1c value of 6.5% or greater indicates that they may have  diabetes and this should be confirmed with a follow-up  test. . For someone with known diabetes, a value <7% indicates  that their diabetes is well controlled and a value  greater than or equal to 7% indicates suboptimal  control. A1c targets should be individualized based on  duration of diabetes, age, comorbid conditions, and  other considerations. . Currently, no consensus exists regarding use of hemoglobin A1c for diagnosis of diabetes for children. .     CBG: Recent Labs  Lab  03/01/18 0748 03/01/18 1224 03/01/18 1622 03/01/18 2151 03/02/18 0829  GLUCAP 316* 186* 166* 244* 282*      Scheduled Meds: . amiodarone  150 mg Intravenous Once  . insulin aspart  0-5 Units Subcutaneous QHS  . insulin aspart  0-9 Units Subcutaneous TID WC  . levothyroxine  88 mcg Oral QAC breakfast  . methylPREDNISolone (SOLU-MEDROL) injection  60 mg Intravenous Q12H  . Warfarin - Pharmacist Dosing Inpatient   Does not apply q1800     LOS: 3 days   If 7PM-7AM, please contact night-coverage [email or web address] Password [email or web address] 03/02/2018, 1:05 PM

## 2018-03-02 NOTE — Progress Notes (Signed)
  Amiodarone Drug - Drug Interaction Consult Note  Recommendations: INR is currently supratherapeutic.  Will evaluate for warfarin regimen decrease as INR returns to the therapeutic range.  Amiodarone is metabolized by the cytochrome P450 system and therefore has the potential to cause many drug interactions. Amiodarone has an average plasma half-life of 50 days (range 20 to 100 days).   There is potential for drug interactions to occur several weeks or months after stopping treatment and the onset of drug interactions may be slow after initiating amiodarone.    Statins: Increased risk of myopathy. Simvastatin- restrict dose to  daily. Other statins: counsel patients to report any muscle pain or weakness immediately.   Anticoagulants: Amiodarone can increase anticoagulant effect. Consider warfarin dose reduction. Patients should be monitored closely and the dose of anticoagulant altered accordingly, remembering that amiodarone levels take several weeks to stabilize.   Antiepileptics: Amiodarone can increase plasma concentration of phenytoin, the dose should be reduced. Note that small changes in phenytoin dose can result in large changes in levels. Monitor patient and counsel on signs of toxicity.   Beta blockers: increased risk of bradycardia, AV block and myocardial depression. Sotalol - avoid concomitant use.    Calcium channel blockers (diltiazem and verapamil): increased risk of bradycardia, AV block and myocardial depression.    Cyclosporine: Amiodarone increases levels of cyclosporine. Reduced dose of cyclosporine is recommended.   Digoxin dose should be halved when amiodarone is started.   Diuretics: increased risk of cardiotoxicity if hypokalemia occurs.   Oral hypoglycemic agents (glyburide, glipizide, glimepiride): increased risk of hypoglycemia. Patient's glucose levels should be monitored closely when initiating amiodarone therapy.    Drugs that  prolong the QT interval:  Torsades de pointes risk may be increased with concurrent use - avoid if possible.  Monitor QTc, also keep magnesium/potassium WNL if concurrent therapy can't be avoided. Marland Kitchen Antibiotics: e.g. fluoroquinolones, erythromycin. . Antiarrhythmics: e.g. quinidine, procainamide, disopyramide, sotalol. . Antipsychotics: e.g. phenothiazines, haloperidol.  . Lithium, tricyclic antidepressants, and methadone. Thank You,  Sallee Provencal  03/02/2018 12:24 PM

## 2018-03-02 NOTE — Progress Notes (Signed)
ANTICOAGULATION CONSULT NOTE - Follow Up Consult  Pharmacy Consult for Warfarin, Cefepime Indication: atrial fibrillation and HCAP  No Known Allergies  82 kg  Vital Signs: Temp: 97.7 F (36.5 C) (05/01 0831) Temp Source: Oral (05/01 0831) BP: 151/91 (05/01 0831) Pulse Rate: 90 (05/01 0831)  Labs: Recent Labs    02/22/2018 0054  02/13/2018 1035 03/01/2018 1649 02/20/2018 2320 04/30/Adam 0233 05/01/Adam 0400  HGB 9.3*  --   --   --   --  9.0* 9.2*  HCT 28.6*  --   --   --   --  27.4* 28.5*  PLT 188  --   --   --   --  199 233  LABPROT 32.1*  --   --   --   --  41.6* 48.3*  INR 3.15  --   --   --   --  4.38* 5.31*  CREATININE 3.55*  --   --   --   --  3.Adam* 2.60*  TROPONINI  --    < > 4.88* 3.83* 2.78*  --   --    < > = values in this interval not displayed.    Estimated Creatinine Clearance: 16.9 mL/min (A) (by C-G formula based on SCr of 2.6 mg/dL (H)).   Medical History: Past Medical History:  Diagnosis Date  . Atrial fibrillation (HCC)   . CORONARY ARTERY DISEASE   . HYPERTENSION   . Hypothyroidism   . Leg pain    a. ABI (05/2014):  ABIs falsely elevated due to calcification; TBIs ok; dopplers without significant stenosis  . MURMUR   . NEPHROLITHIASIS, HX OF   . SINUS BRADYCARDIA   . VITAMIN B12 DEFICIENCY    Assessment: 84 y/o Adam [name] on chronic anticoagulation with coumadin for atrial fibrillation.  His INR was 3.09 on admission on a regimen of Coumadin 2.5mg  daily except 1.25mg  on Mon PTA. His INR is now up to 5.31 despite giving reduced doses of Coumadin and holding Coumadin 4/30.  His hemoglobin is stable with no bleeding noted.  He is also on Day #4 of cefepime for HCAP.  His creatinine is trending down and his regimen remains appropriate for his renal function.  Goal of Therapy:  INR 2-3 Monitor platelets by anticoagulation protocol: Yes   Plan:  Hold Coumadin tonight Monitor daily INR  Continue cefepime 1g IV q24h Monitor renal function and available micro  data  Estella Husk, Pharm.D., BCPS, BCIDP Clinical Pharmacist Phone: [phone] 03/02/2018, 9:05 AM

## 2018-03-02 NOTE — Progress Notes (Signed)
Inpatient Diabetes Program Recommendations  AACE/ADA: New Consensus Statement on Inpatient Glycemic Control (2015)  Target Ranges:  Prepandial:   less than 140 mg/dL      Peak postprandial:   less than 180 mg/dL (1-2 hours)      Critically ill patients:  140 - 180 mg/dL   Results for JABEZ, MOLNER (MRN [id number]) as of 03/01/2018 11:50  Ref. Range 03/01/2018 04:20 01/31/2018 07:40 02/24/2018 11:35 02/13/2018 17:28 02/14/2018 20:40 03/01/2018 07:48  Glucose-Capillary Latest Ref Range: 65 - 99 mg/dL 409 (H) 811 (H) 914 (H) 225 (H) 232 (H) 316 (H)   Results for ELISHAH, ASHMORE (MRN [id number]) as of 03/01/2018 11:50  Ref. Range 10/29/2017 11:15 02/08/2018 17:03 2018-03-27 09:35  Hemoglobin A1C Latest Ref Range: 4.8 - 5.6 % 6.8 (H) 7.1 (H) 6.9 (H)    Review of Glycemic Control  Diabetes history: DM2 Outpatient Diabetes medications: None Current orders for Inpatient glycemic control: Novolog 0-9 units TID with meals, Novolog 0-5 units QHS; Solumedrol 60 mg Q12H  Inpatient Diabetes Program Recommendations:  Insulin - Basal: If steroids are continued as ordered, please consider ordering Lantus 7 units daily (based on 71 kg x 0.1 units).  HgbA1C: A1C 6.9% on 2018/03/27 indicating average glucose of 151 mg/dl over the past 2-3 months which is appropriate goal for patient. Diet: If appropriate, please consider discontinuing Regular diet and order Carb Modified diet.  Thanks, Orlando Penner, RN, MSN, CDE Diabetes Coordinator Inpatient Diabetes Program [phone] (Team Pager from 8am to 5pm)

## 2018-03-02 DEATH — deceased

## 2018-03-03 LAB — RENAL FUNCTION PANEL
ANION GAP: 16 — AB (ref 5–15)
Albumin: 2.2 g/dL — ABNORMAL LOW (ref 3.5–5.0)
BUN: 112 mg/dL — ABNORMAL HIGH (ref 6–20)
CO2: 21 mmol/L — AB (ref 22–32)
Calcium: 8.1 mg/dL — ABNORMAL LOW (ref 8.9–10.3)
Chloride: 114 mmol/L — ABNORMAL HIGH (ref 101–111)
Creatinine, Ser: 2.75 mg/dL — ABNORMAL HIGH (ref 0.61–1.24)
GFR calc non Af Amer: 19 mL/min — ABNORMAL LOW (ref >60)
GFR, EST AFRICAN AMERICAN: 22 mL/min — AB (ref >60)
GLUCOSE: 373 mg/dL — AB (ref 65–99)
POTASSIUM: 3.7 mmol/L (ref 3.5–5.1)
Phosphorus: 5.5 mg/dL — ABNORMAL HIGH (ref 2.5–4.6)
Sodium: 151 mmol/L — ABNORMAL HIGH (ref 135–145)

## 2018-03-03 LAB — PROTIME-INR
INR: 7.15
INR: 7.65 — AB
PROTHROMBIN TIME: 64.2 s — AB (ref 11.4–15.2)
Prothrombin Time: 60.9 seconds — ABNORMAL HIGH (ref 11.4–15.2)

## 2018-03-03 LAB — GLUCOSE, CAPILLARY
GLUCOSE-CAPILLARY: 372 mg/dL — AB (ref 65–99)
Glucose-Capillary: 376 mg/dL — ABNORMAL HIGH (ref 65–99)
Glucose-Capillary: 322 mg/dL — ABNORMAL HIGH (ref 65–99)
Glucose-Capillary: 395 mg/dL — ABNORMAL HIGH (ref 65–99)

## 2018-03-03 MED ORDER — VITAMIN K1 10 MG/ML IJ SOLN
2.0000 mg | Freq: Once | INTRAVENOUS | Status: AC
  Administered 2018-03-03: 2 mg via INTRAVENOUS
  Filled 2018-03-03: qty 0.2

## 2018-03-03 MED ORDER — SALINE SPRAY 0.65 % NA SOLN
1.0000 | NASAL | Status: DC | PRN
  Administered 2018-03-03: 1 via NASAL
  Filled 2018-03-03: qty 44

## 2018-03-03 MED ORDER — DEXTROSE-NACL 5-0.2 % IV SOLN
INTRAVENOUS | Status: DC
  Administered 2018-03-03 – 2018-03-04 (×2): via INTRAVENOUS
  Filled 2018-03-03 (×3): qty 1000

## 2018-03-03 MED ORDER — INSULIN GLARGINE 100 UNIT/ML ~~LOC~~ SOLN
15.0000 [IU] | Freq: Every day | SUBCUTANEOUS | Status: DC
  Administered 2018-03-03: 15 [IU] via SUBCUTANEOUS
  Filled 2018-03-03 (×2): qty 0.15

## 2018-03-03 MED ORDER — OXYMETAZOLINE HCL 0.05 % NA SOLN
1.0000 | Freq: Two times a day (BID) | NASAL | Status: DC
  Administered 2018-03-03: 1 via NASAL
  Filled 2018-03-03: qty 15

## 2018-03-03 NOTE — Progress Notes (Signed)
[place] TEAM 1 - Stepdown/ICU TEAM  Adam [name]  ZOX:[id number] DOB: 04-22-1923 DOA: 12-Mar-2018 PCP: Lucky Cowboy, MD    Brief Narrative:  84 year old male with a hx of CAD, AoS with a valve area of 1 but peak gradient of 35, atrial fibrillation on Coumadin, and CKD stage IV who presented w/ 3 days of shaking chills and shortness of breath. Patient was seen by his PCP and started on doxycycline.   In the ED he was found to have a leukocytosis, GFR of 17, and a CXR noting perihilar infiltrates suggestive of pneumonia versus edema.  EKG reveals atrial fibrillation at 100.  Troponin was 4.  ----------------------------------------------------------------------------------------------------------------------------------- Significant Events: 4/28 admit  Subjective:  Patient was seen and examined today.  Still on BiPAP.  No issues overnight. Wife present at bedside.  Still complaining that he has lost his voice.  Complaining of nasal congestion.  Today INR noted >7 No signs of complaining of bleeding  Also worsening BUN/creatinine, elevated sodium noted   -----------------------------------------------------------------------------------------------------------------------------------  Assessment & Plan:  Supratherapeutic INR -Holding Coumadin -We will administer small dose of vitamin K, monitor closely for any signs of bleeding. -Currently stable  Parox atrial fibrillation with acute RVR Chadsvasc is 5 - on chronic warfarin - Cards following - on   Acute respiratory failure -Try to wean patient off BiPAP, Ventimask, O2 via nasal cannula -Hypoxia likely exacerbated with severe sepsis, multilobar pneumonia -Continue BiPAP -Continue antibiotics,  -Continue IV Solu-Medrol, Xopenex DuoNeb bronchodilator treatment (alternative to albuterol as patient has a history of atrial fibrillation with RVR, to minimize tachycardia)  Severe Sepsis and acute hypoxic respiratory failure  due to Multilobar Pneumonia Cont empiric abx and follow clinically - wean off BIPAP as able  -Continue antibiotics of Maxipime   NSTEMI Not a candidate for cardiac cath due to obvious reasons - Cards directing medical care   AKI on CKD stage IV Baseline creatinine approximately 2 - keep hydrated and follow  Recent Labs  Lab 03/12/18 0607 02/21/2018 0054 03/01/18 0233 03/02/18 0400 03/03/18 0237  CREATININE 3.03* 3.55* 3.19* 2.60* 2.75*   -Elevated sodium to 151, creatinine is above -Neurology following -Recommended -gentle hypotonic IV fluid D5 1/4 ormal saline  Chronic diastolic CHF TTE 06/02/18 noted EF 60-65% - appears volume depleted at this time - gently hydrate - follow weights   Filed Weights   03/01/18 0501 03/02/18 0350 03/03/18 0500  Weight: 71.2 kg (156 lb 15.5 oz) 71 kg (156 lb 8.4 oz) 69 kg (152 lb 1.9 oz)    Moderate aortic stenosis -Trend closely,  -cardiology following  DM 2 CBG reasonably controlled -  Hyperglycemia as patient on D51/4 NS per nephrology recommendations, also on Solu-Medrol, will add a long-acting insulin -We will continue SSI,  HTN BP currently controlled  HLD -Continue statins  Nasal congestion,  - NS nasal spray, Afrin, Flonase     DVT prophylaxis: warfarin  Code Status: DNR - NO CODE Family Communication: no family present at time of exam  Disposition Plan:   Consultants:  Nephrology Cardiology   Antimicrobials:  Cefepime 4/28 > Vancomycin 4/28 > 4/29  Objective: Blood pressure (!) 141/64, pulse (!) 106, temperature 97.6 F (36.4 C), temperature source Axillary, resp. rate (!) 28, height  (1.702 m), weight 69 kg (152 lb 1.9 oz), SpO2 97 %.  Intake/Output Summary (Last 24 hours) at 03/03/2018 1155 Last data filed at 03/03/2018 0850 Gross per 24 hour  Intake 307.72 ml  Output 400 ml  Net -92.28  ml   Filed Weights   03/01/18 0501 03/02/18 0350 03/03/18 0500  Weight: 71.2 kg (156 lb 15.5 oz) 71 kg (156 lb 8.4  oz) 69 kg (152 lb 1.9 oz)    Examination: General: Awake alert, reporting that he has lost his voice, on BiPAP Lungs: Diffuse rhonchi, mild wheezing, subtle crackles possibly mildly had lower lobes Cardiovascular: Irregularly irregular mildly tacky S1-S2, no murmurs Abdomen: Soft nontender nondistended positive bowel sounds Extremities: No significant cyanosis, clubbing, or edema bilateral lower extremities  CBC: Recent Labs  Lab 02/25/18 1053 Mar 07, 2018 0607 02/19/2018 0054 03/01/18 0233 03/02/18 0400  WBC 12.1* 16.4* 13.8* 11.8* 8.8  NEUTROABS 9,825* 13.6*  --   --   --   HGB 9.4* 9.7* 9.3* 9.0* 9.2*  HCT 28.0* 31.7* 28.6* 27.4* 28.5*  MCV 95.9 101.9* 96.6 95.1 96.3  PLT 218 235 188 199 233   Basic Metabolic Panel: Recent Labs  Lab 03/01/18 0233 03/02/18 0400 03/03/18 0237  NA 139 145 151*  K 3.2* 3.5 3.7  CL 106 110 114*  CO2 19* 19* 21*  GLUCOSE 274* 271* 373*  BUN 93* 90* 112*  CREATININE 3.19* 2.60* 2.75*  CALCIUM 7.8* 8.0* 8.1*  PHOS 4.5 4.9* 5.5*   GFR: Estimated Creatinine Clearance: 16 mL/min (A) (by C-G formula based on SCr of 2.75 mg/dL (H)).  Liver Function Tests: Recent Labs  Lab 02/25/18 1053 03/01/18 0233 03/02/18 0400 03/03/18 0237  AST 15  --   --   --   ALT 11  --   --   --   BILITOT 1.7*  --   --   --   PROT 6.8  --   --   --   ALBUMIN  --  2.1* 2.1* 2.2*   No results for input(s): LIPASE, AMYLASE in the last 168 hours. No results for input(s): AMMONIA in the last 168 hours.  Coagulation Profile: Recent Labs  Lab 02/04/2018 0054 03/01/18 0233 03/02/18 0400 03/03/18 0237 03/03/18 0929  INR 3.15 4.38* 5.31* 7.15* 7.65*    Cardiac Enzymes: Recent Labs  Lab 03/07/18 2031 02/26/2018 0616 02/27/2018 1035 02/12/2018 1649 02/03/2018 2320  TROPONINI 9.13* 5.54* 4.88* 3.83* 2.78*    HbA1C: Hgb A1c MFr Bld  Date/Time Value Ref Range Status  03-07-18 09:35 AM 6.9 (H) 4.8 - 5.6 % Final    Comment:    (NOTE) Pre diabetes:           5.7%-6.4% Diabetes:              >6.4% Glycemic control for   <7.0% adults with diabetes   02/08/2018 05:03 PM 7.1 (H) <5.7 % of total Hgb Final    Comment:    For someone without known diabetes, a hemoglobin A1c value of 6.5% or greater indicates that they may have  diabetes and this should be confirmed with a follow-up  test. . For someone with known diabetes, a value <7% indicates  that their diabetes is well controlled and a value  greater than or equal to 7% indicates suboptimal  control. A1c targets should be individualized based on  duration of diabetes, age, comorbid conditions, and  other considerations. . Currently, no consensus exists regarding use of hemoglobin A1c for diagnosis of diabetes for children. .     CBG: Recent Labs  Lab 03/02/18 1132 03/02/18 1405 03/02/18 1615 03/02/18 2121 03/03/18 0748  GLUCAP 294* 311* 332* 266* 376*      Scheduled Meds: . guaiFENesin-dextromethorphan  10 mL Oral  Q6H  . insulin aspart  0-5 Units Subcutaneous QHS  . insulin aspart  0-9 Units Subcutaneous TID WC  . levalbuterol  1.25 mg Nebulization Q6H  . levothyroxine  88 mcg Oral QAC breakfast  . methylPREDNISolone (SOLU-MEDROL) injection  40 mg Intravenous Q6H  . oxymetazoline  1 spray Each Nare BID  . Warfarin - Pharmacist Dosing Inpatient   Does not apply q1800     LOS: 4 days   If 7PM-7AM, please contact night-coverage [email or web address] Password [email or web address] 03/03/2018, 11:55 AM

## 2018-03-03 NOTE — Progress Notes (Signed)
Pt c/o "lump in throat," RT removed BiPAP briefly to suction pt. Bright red bloody sputum was noted in the suction tube. On call MD Katrinka Blazing was notified. MD Katrinka Blazing instructed this nurse to continue to monitor the pt for any repeat incidences of bloody sputum.

## 2018-03-03 NOTE — Progress Notes (Signed)
ANTICOAGULATION CONSULT NOTE - Follow Up Consult  Pharmacy Consult for Warfarin Indication: atrial fibrillation   No Known Allergies  Vital Signs: Temp: 97.6 F (36.4 C) (05/02 0404) Temp Source: Axillary (05/02 0404) BP: 150/70 (05/02 0404) Pulse Rate: 94 (05/02 0404)  Labs: Recent Labs    02/26/2018 1035 02/24/2018 1649 02/03/2018 2320 03/01/18 0233 03/02/18 0400 03/03/18 0237  HGB  --   --   --  9.0* 9.2*  --   HCT  --   --   --  27.4* 28.5*  --   PLT  --   --   --  199 233  --   LABPROT  --   --   --  41.6* 48.3* 60.9*  INR  --   --   --  4.38* 5.31* 7.15*  CREATININE  --   --   --  3.19* 2.60* 2.75*  TROPONINI 4.88* 3.83* 2.78*  --   --   --    Estimated Creatinine Clearance: 16 mL/min (A) (by C-G formula based on SCr of 2.75 mg/dL (H)).  Assessment: 84 y/o M on chronic anticoagulation with coumadin for atrial fibrillation.  His INR was 3.09 on admission on a regimen of Coumadin 2.5mg  daily except 1.25mg  on Mon PTA. His INR is now up to 7.15 despite giving reduced doses of Coumadin and holding Coumadin 4/30 and 5/1.    His hemoglobin was stable 5/1, CBC ordered for tomorrow. No bleeding noted.  Goal of Therapy:  INR 2-3 Monitor platelets by anticoagulation protocol: Yes   Plan:  Hold Coumadin again tonight Monitor daily INR, CBC Monitor for s/sx of bleeding   Diana L. Marcy Salvo, PharmD, MS PGY1 Pharmacy Resident Pager: [phone]

## 2018-03-03 NOTE — Progress Notes (Signed)
Inpatient Diabetes Program Recommendations  AACE/ADA: New Consensus Statement on Inpatient Glycemic Control (2015)  Target Ranges:  Prepandial:   less than 140 mg/dL      Peak postprandial:   less than 180 mg/dL (1-2 hours)      Critically ill patients:  140 - 180 mg/dL   Lab Results  Component Value Date   GLUCAP 376 (H) 03/03/2018   HGBA1C 6.9 (H) 02/09/2018    Review of Glycemic ControlResults for ADITYA, NASTASI (MRN [id number]) as of 03/03/2018 10:22  Ref. Range 03/02/2018 11:32 03/02/2018 14:05 03/02/2018 16:15 03/02/2018 21:21 03/03/2018 07:48  Glucose-Capillary Latest Ref Range: 65 - 99 mg/dL 621 (H) 308 (H) 657 (H) 266 (H) 376 (H)   Diabetes history: Type 2 DM  Outpatient Diabetes medications: None Current orders for Inpatient glycemic control:  Novolog sensitive tid with meals and HS Solumedrol 40 mg IV q 6 hours Inpatient Diabetes Program Recommendations:   Note CBG's increased with steroids. Please increase Novolog to moderate tid with meals and HS.  Also consider adding Levemir 10 units q AM while on steroids.   Thanks,  Beryl Meager, RN, BC-ADM Inpatient Diabetes Coordinator Pager [phone] (8a-5p)

## 2018-03-03 NOTE — Progress Notes (Signed)
CRITICAL VALUE ALERT  Critical Value: INR 7.15  Date & Time Notied: 03/03/18 0430  Provider Notified: On Call MD Katrinka Blazing  Orders Received/Actions taken: No new orders at this time

## 2018-03-03 NOTE — Progress Notes (Addendum)
Progress Note  Patient Name: Adam [name] Date of Encounter: 03/03/2018  Primary Cardiologist: Dr. Eden Emms  Subjective   Patient very hard of hearing. Nurse was able to wean bipap down to high flow [place] today with O2 sats 92%. Had "anxiety attack" earlier today which has since calmed down, but resp status remains dependent on O2. No CP.  Inpatient Medications    Scheduled Meds: . guaiFENesin-dextromethorphan  10 mL Oral Q6H  . insulin aspart  0-5 Units Subcutaneous QHS  . insulin aspart  0-9 Units Subcutaneous TID WC  . insulin glargine  15 Units Subcutaneous Daily  . levalbuterol  1.25 mg Nebulization Q6H  . levothyroxine  88 mcg Oral QAC breakfast  . methylPREDNISolone (SOLU-MEDROL) injection  40 mg Intravenous Q6H  . oxymetazoline  1 spray Each Nare BID  . Warfarin - Pharmacist Dosing Inpatient   Does not apply q1800   Continuous Infusions: . sodium chloride 10 mL/hr at 03/02/18 0155  . amiodarone 30 mg/hr (03/03/18 1425)  . ceFEPime (MAXIPIME) IV Stopped (03/03/18 1009)  . dextrose 5 % and 0.2 % NaCl 50 mL/hr at 03/03/18 1017   PRN Meds: acetaminophen, acetaminophen, ALPRAZolam, benzonatate, haloperidol lactate, ondansetron (ZOFRAN) IV, sodium chloride   Vital Signs    Vitals:   03/03/18 1150 03/03/18 1151 03/03/18 1245 03/03/18 1301  BP: (!) 141/64  126/73   Pulse: (!) 107 (!) 106 (!) 105 (!) 105  Resp: (!) 27 (!) 28 (!) 27 (!) 33  Temp:   97.9 F (36.6 C)   TempSrc:   Axillary   SpO2: 97% 97%  94%  Weight:      Height:        Intake/Output Summary (Last 24 hours) at 03/03/2018 1431 Last data filed at 03/03/2018 0850 Gross per 24 hour  Intake 307.72 ml  Output 400 ml  Net -92.28 ml   Filed Weights   03/01/18 0501 03/02/18 0350 03/03/18 0500  Weight: 156 lb 15.5 oz (71.2 kg) 156 lb 8.4 oz (71 kg) 152 lb 1.9 oz (69 kg)    Telemetry    Atrial fib rates 90-110 - Personally Reviewed  Physical Exam   GEN: No acute distress, frail, elderly gentleman in  NAD HEENT: Normocephalic, atraumatic, sclera non-icteric. Neck: No JVD or bruits. Cardiac: Irregularly irregular, tachycardic, no murmurs, rubs, or gallops.  Radials/DP/PT 1+ and equal bilaterally.  Respiratory: Coarse BS throughout with subtle crackles in lower lobes, very rare exp wheeze, no rhonchi. Mild increase WOB GI: Soft, nontender, non-distended, BS +x 4. MS: no deformity but generalized atrophy and tremor noted  Extremities: No clubbing or cyanosis. No edema. Distal pedal pulses are 2+ and equal bilaterally. Neuro:  AAOx3. Follows commands. Psych:  Responds to questions appropriately with a normal affect.  Labs    Chemistry Recent Labs  Lab 02/25/18 1053  03/01/18 0233 03/02/18 0400 03/03/18 0237  NA 137   < > 139 145 151*  K 5.7*   < > 3.2* 3.5 3.7  CL 103   < > 106 110 114*  CO2 19*   < > 19* 19* 21*  GLUCOSE 285*   < > 274* 271* 373*  BUN 39*   < > 93* 90* 112*  CREATININE 2.20*   < > 3.19* 2.60* 2.75*  CALCIUM 8.5*   < > 7.8* 8.0* 8.1*  PROT 6.8  --   --   --   --   ALBUMIN  --   --  2.1* 2.1* 2.2*  AST 15  --   --   --   --   ALT 11  --   --   --   --   BILITOT 1.7*  --   --   --   --   GFRNONAA 25*   < > 16* 20* 19*  GFRAA 29*   < > 18* 23* 22*  ANIONGAP  --    < > 14 16* 16*   < > = values in this interval not displayed.     Hematology Recent Labs  Lab 02/17/2018 0054 03/01/18 0233 03/02/18 0400  WBC 13.8* 11.8* 8.8  RBC 2.96* 2.88* 2.96*  HGB 9.3* 9.0* 9.2*  HCT 28.6* 27.4* 28.5*  MCV 96.6 95.1 96.3  MCH 31.4 31.3 31.1  MCHC 32.5 32.8 32.3  RDW 14.7 14.7 15.2  PLT 188 199 233    Cardiac Enzymes Recent Labs  Lab 02/14/2018 0616 02/27/2018 1035 02/16/2018 1649 02/26/2018 2320  TROPONINI 5.54* 4.88* 3.83* 2.78*    Recent Labs  Lab 03/02/2018 0612  TROPIPOC 4.13*     BNP Recent Labs  Lab Mar 02, 2018 0607  BNP 1,044.6*     DDimer No results for input(s): DDIMER in the last 168 hours.   Radiology    Dg Chest Port 1 View  Result Date:  03/02/2018 CLINICAL DATA:  Pneumonia EXAM: PORTABLE CHEST 1 VIEW COMPARISON:  03/01/2018 FINDINGS: Improvement in bilateral airspace disease. Probable clearing edema. Cardiac enlargement. Bibasilar atelectasis/infiltrate left greater than right and small effusions IMPRESSION: Improving bilateral airspace disease most compatible with partial clearing of pulmonary edema. Electronically Signed   By: Marlan Palau M.D.   On: 03/02/2018 10:26    Patient Profile     84 y.o. male with PAF, HTN, CKD IV, CAD s/p PCI 1990s (last cath 2000 -> med rx), moderate AS. Recently seen by PCP for f/u ?Cr at which time he reported DOE/fatigue. The next day he developed cough/hemoptysis with CXR suggesting PNA. Despite initiation of abx he declined and was subsequently admitted with acute respiratory failure requiring bipap, CAP, AKI on CKD, leukocytosis, recurrent AF RVR, and elevated troponin.  Assessment & Plan    1. Paroxysmal atrial fibrillation - amio stopped PTA 01/31/18 2/2 low TSH. Restarted upon admission - initially converted to NSR but went back into AF/flutter so was restarted on amiodarone yesterday. He is maintaining a HR of 90-110 which is not unexpected given the degree of respiratory illness. I would avoid beta blocker with wheezing but wonder if addition of diltiazem would be helpful to further control HR. Given his acute illness, hypernatremia, and hypoxia I am not convinced of our ability to maintain NSR until he is clinically improved. I will review addition with dilt with Dr. Excell Seltzer - earlier notes allude to reassessment of LV function but do not see this has been ordered. The last assessment we have of EF was prior to this admission (4/1 - normal). IM has given vitamin K for persistently elevated INR.  2. NSTEMI - troponin peak 10.4, possibly related to demand ischemia in the setting of respiratory failure. The patient is not a candidate for cardiac catheterization with advanced kidney disease at age 29.  Aspirin dc'd 4/30 by IM I suspect due to perpetually elevated INR. Can resume when more stable. Statin is also on hold - f/u LFTs in AM and resume if stable.  3. AKI on CKD stage IV - nephrology following. Initially patient was treated with diuresis for pulm edema -> now sodium  151 with rising BUN/Cr so renal/IM recommending D5/1/4NS.   4. Sepsis/acute respiratory failure in setting of acute diastolic CHF, multilobar PNA, AF - as above. CXR 5/1 interim partial clearing of pulm edema. Would recommend to consider PCCM consult if he does not show continued clinical improvement.  5. Anemia - hemoglobin appears slightly below recent baseline, further monitoring per primary team.  6. Mod AS by echo 01/31/18 - follow clinically, but not a surgical candidate should the need arise in the future.  Prognosis: remains guarded in my eyes. Given his advanced age and multisystem involvement, would consider palliative care conversations in the days to come if he does not show further improvement. The afternoon nurse reports some improvement in being able to wean off bipap, but I remain concerned about his prognosis in the face of many serious comorbidities.  For questions or updates, please contact [facility] Please consult [email or web address] for contact info under Cardiology/STEMI.  Signed, Laurann Montana, PA-C 03/03/2018, 2:31 PM    Patient seen, examined. Available data reviewed. Agree with findings, assessment, and plan as outlined by Ronie Spies, PA-C. On my exam he is an elderly male in mild respiratory distress. Lungs with coarse breath sounds, CV: irregularly irregular with a 2/6 harsh systolic murmur at the RUSB/LLSB, extremities without edema.   Agree with findings and plan as above.Suspect AF will be difficult to control. Continue IV amiodarone until taking PO then convert to oral loading. Otherwise will follow with you. Agree with very guarded prognosis.   Tonny Bollman, M.D. 03/03/2018 3:38 PM

## 2018-03-03 NOTE — Progress Notes (Signed)
Called to patients room by SWAT nurse. Patient reported feeling like he was having a anxiety attack. Patient very restless with RR in the 40's. Administered ordered prn Haldol. Patient is now calm resting in bed with eyes closed RR in the 20's. Wife is present at the bedside. Will continue to monitor. VS stable.

## 2018-03-03 NOTE — Progress Notes (Signed)
RT placed patient on 8L HFNC (salter). Vitals are stable and sats are 95%. Patient seems comfortable and is happy to have the bipap mask off. RN is aware. RT will continue to monitor.

## 2018-03-03 NOTE — Progress Notes (Signed)
Subjective: Interval History: has complaints hungry.  Objective: Vital signs in last 24 hours: Temp:  [97.6 F (36.4 C)-98 F (36.7 C)] 97.6 F (36.4 C) (05/02 0404) Pulse Rate:  [81-117] 107 (05/02 0828) Resp:  [18-30] 28 (05/02 0828) BP: (111-165)/(49-128) 120/49 (05/02 0828) SpO2:  [93 %-100 %] 96 % (05/02 0828) FiO2 (%):  [40 %] 40 % (05/02 0825) Weight:  [69 kg (152 lb 1.9 oz)] 69 kg (152 lb 1.9 oz) (05/02 0500) Weight change: -2 kg (-4 lb 6.6 oz)  Intake/Output from previous day: 05/01 0701 - 05/02 0700 In: 200.4 [I.V.:200.4] Out: 975 [Urine:975] Intake/Output this shift: No intake/output data recorded.  General appearance: alert and cooperative Resp: rales LLL and on BIPAP Cardio: irregularly irregular rhythm and systolic murmur: systolic ejection 2/6, decrescendo at 2nd left intercostal space GI: pos bs, liver down 4 cm Extremities: extremities normal, atraumatic, no cyanosis or edema  Lab Results: Recent Labs    03/01/18 0233 03/02/18 0400  WBC 11.8* 8.8  HGB 9.0* 9.2*  HCT 27.4* 28.5*  PLT 199 233   BMET:  Recent Labs    03/02/18 0400 03/03/18 0237  NA 145 151*  K 3.5 3.7  CL 110 114*  CO2 19* 21*  GLUCOSE 271* 373*  BUN 90* 112*  CREATININE 2.60* 2.75*  CALCIUM 8.0* 8.1*   Recent Labs    02/22/2018 1035  PTH 69*   Iron Studies:  Recent Labs    02/18/2018 1035  IRON 44*  TIBC 155*    Studies/Results: Dg Chest Port 1 View  Result Date: 03/02/2018 CLINICAL DATA:  Pneumonia EXAM: PORTABLE CHEST 1 VIEW COMPARISON:  02/22/2018 FINDINGS: Improvement in bilateral airspace disease. Probable clearing edema. Cardiac enlargement. Bibasilar atelectasis/infiltrate left greater than right and small effusions IMPRESSION: Improving bilateral airspace disease most compatible with partial clearing of pulmonary edema. Electronically Signed   By: Marlan Palau M.D.   On: 03/02/2018 10:26    I have reviewed the patient's current  medications.  Assessment/Plan: 1 AKI Cr ^ today and is hyper Osm with Kermit Balo for diuresis, and ^ glu.  Will use hypotonic fluid. CKD4 2 Anemia stable 3 Pneu on AB improving 4 Afib rate control on Amio 5 DM needs better control 6 Debill P D51/4,  AB mobilize    LOS: 4 days   Adam [name] Adam [name] 03/03/2018,8:30 AM

## 2018-03-03 NOTE — Progress Notes (Signed)
MD notified of critical INR 7.65. Patient just finished Vitamin K infusion. MD ordered to monitor for bleeding and recheck INR in the morning.

## 2018-03-03 NOTE — Care Management Important Message (Signed)
Important Message  Patient Details  Name: Adam [name] MRN: [id number] Date of Birth: 01-29-1923   Medicare Important Message Given:  Yes    Dorena Bodo 03/03/2018, 1:44 PM

## 2018-03-03 NOTE — Plan of Care (Signed)
  Problem: Clinical Measurements: Goal: Respiratory complications will improve Outcome: Not Progressing Note:  Pt continuing to require BiPAP, attempts to wean during the day unsuccessful.

## 2018-03-04 ENCOUNTER — Inpatient Hospital Stay (HOSPITAL_COMMUNITY): Payer: Medicare Other

## 2018-03-04 LAB — BLOOD GAS, ARTERIAL
Acid-base deficit: 10.7 mmol/L — ABNORMAL HIGH (ref 0.0–2.0)
Bicarbonate: 16.9 mmol/L — ABNORMAL LOW (ref 20.0–28.0)
DRAWN BY: 521601
O2 Content: 15 L/min
O2 Saturation: 83.6 %
PCO2 ART: 52.5 mmHg — AB (ref 32.0–48.0)
Patient temperature: 98.1
pH, Arterial: 7.133 — CL (ref 7.350–7.450)
pO2, Arterial: 63.6 mmHg — ABNORMAL LOW (ref 83.0–108.0)

## 2018-03-04 LAB — RENAL FUNCTION PANEL
ALBUMIN: 2.2 g/dL — AB (ref 3.5–5.0)
ANION GAP: 18 — AB (ref 5–15)
BUN: 140 mg/dL — ABNORMAL HIGH (ref 6–20)
CO2: 17 mmol/L — ABNORMAL LOW (ref 22–32)
Calcium: 8.3 mg/dL — ABNORMAL LOW (ref 8.9–10.3)
Chloride: 117 mmol/L — ABNORMAL HIGH (ref 101–111)
Creatinine, Ser: 3.38 mg/dL — ABNORMAL HIGH (ref 0.61–1.24)
GFR calc non Af Amer: 14 mL/min — ABNORMAL LOW (ref >60)
GFR, EST AFRICAN AMERICAN: 17 mL/min — AB (ref >60)
GLUCOSE: 431 mg/dL — AB (ref 65–99)
PHOSPHORUS: 7.1 mg/dL — AB (ref 2.5–4.6)
POTASSIUM: 3.8 mmol/L (ref 3.5–5.1)
Sodium: 152 mmol/L — ABNORMAL HIGH (ref 135–145)

## 2018-03-04 LAB — CBC
HEMATOCRIT: 31 % — AB (ref 39.0–52.0)
HEMOGLOBIN: 9.8 g/dL — AB (ref 13.0–17.0)
MCH: 31.3 pg (ref 26.0–34.0)
MCHC: 31.6 g/dL (ref 30.0–36.0)
MCV: 99 fL (ref 78.0–100.0)
Platelets: 226 10*3/uL (ref 150–400)
RBC: 3.13 MIL/uL — AB (ref 4.22–5.81)
RDW: 15.9 % — ABNORMAL HIGH (ref 11.5–15.5)
WBC: 16.9 10*3/uL — ABNORMAL HIGH (ref 4.0–10.5)

## 2018-03-04 LAB — GLUCOSE, CAPILLARY
Glucose-Capillary: 395 mg/dL — ABNORMAL HIGH (ref 65–99)
Glucose-Capillary: 353 mg/dL — ABNORMAL HIGH (ref 65–99)
Glucose-Capillary: 301 mg/dL — ABNORMAL HIGH (ref 65–99)

## 2018-03-04 LAB — HEPATIC FUNCTION PANEL
ALBUMIN: 2.3 g/dL — AB (ref 3.5–5.0)
ALT: 485 U/L — AB (ref 17–63)
AST: 68 U/L — ABNORMAL HIGH (ref 15–41)
Alkaline Phosphatase: 130 U/L — ABNORMAL HIGH (ref 38–126)
BILIRUBIN TOTAL: 1.6 mg/dL — AB (ref 0.3–1.2)
Bilirubin, Direct: 0.5 mg/dL (ref 0.1–0.5)
Indirect Bilirubin: 1.1 mg/dL — ABNORMAL HIGH (ref 0.3–0.9)
Total Protein: 6.4 g/dL — ABNORMAL LOW (ref 6.5–8.1)

## 2018-03-04 LAB — CULTURE, BLOOD (ROUTINE X 2)
CULTURE: NO GROWTH
Culture: NO GROWTH

## 2018-03-04 LAB — PROTIME-INR
INR: 4.11
Prothrombin Time: 39.6 seconds — ABNORMAL HIGH (ref 11.4–15.2)

## 2018-03-04 LAB — PROCALCITONIN: PROCALCITONIN: 0.98 ng/mL

## 2018-03-04 MED ORDER — SODIUM BICARBONATE 650 MG PO TABS
1300.0000 mg | ORAL_TABLET | Freq: Two times a day (BID) | ORAL | Status: DC
  Administered 2018-03-04: 1300 mg via ORAL
  Filled 2018-03-04: qty 2

## 2018-03-04 MED ORDER — DEXTROSE 5 % IV SOLN
INTRAVENOUS | Status: DC
  Administered 2018-03-04: 08:00:00 via INTRAVENOUS

## 2018-03-04 MED ORDER — FUROSEMIDE 10 MG/ML IJ SOLN
20.0000 mg | Freq: Once | INTRAMUSCULAR | Status: AC
  Administered 2018-03-04: 20 mg via INTRAVENOUS
  Filled 2018-03-04: qty 2

## 2018-03-04 MED ORDER — METHYLPREDNISOLONE SODIUM SUCC 40 MG IJ SOLR: 40.0000 mg | Freq: Three times a day (TID) | INTRAMUSCULAR | Status: DC

## 2018-03-04 MED ORDER — INSULIN GLARGINE 100 UNIT/ML ~~LOC~~ SOLN
20.0000 [IU] | Freq: Two times a day (BID) | SUBCUTANEOUS | Status: DC
  Administered 2018-03-04: 20 [IU] via SUBCUTANEOUS
  Filled 2018-03-04 (×3): qty 0.2

## 2018-03-04 MED ORDER — LANTHANUM CARBONATE 500 MG PO CHEW
1000.0000 mg | CHEWABLE_TABLET | Freq: Three times a day (TID) | ORAL | Status: DC
  Filled 2018-03-04: qty 2

## 2018-03-04 MED ORDER — MORPHINE SULFATE (PF) 2 MG/ML IV SOLN
2.0000 mg | INTRAVENOUS | Status: DC | PRN
  Administered 2018-03-04: 2 mg via INTRAVENOUS
  Filled 2018-03-04: qty 1

## 2018-03-05 NOTE — Progress Notes (Signed)
Pt has been transported to the morgue.

## 2018-04-02 NOTE — Progress Notes (Signed)
Pt has worsening breath sounds with increased coarse crackles, mainly on the left side. SpO2 WNL on 60% O2 on the BiPAP. Pt continues to be tachypneic in the 30's. On call NP Bodenheimer notified. 20 mg of lasix IV push ordered. Will continue to monitor.

## 2018-04-02 NOTE — Progress Notes (Signed)
Wife and 2 sons here with father/husband. HE likely has little time left. IT is hard for them to take him off the tube.  I wilol go back and see if they would be at pe   Mar 08, 2018 1700  Clinical Encounter Type  Visited With Patient and family together  Visit Type Spiritual support;Critical Care;Patient actively dying  Referral From Social work  Consult/Referral To Orthoptist  Spiritual Encounters  Spiritual Needs Prayer;Emotional  Stress Factors  Patient Stress Factors None identified  Family Stress Factors Loss;Other (Comment) (know he doesnt want to be on tube but hard to say take it.)  ace following his wishes.

## 2018-04-02 NOTE — Progress Notes (Signed)
[place] TEAM 1 - Stepdown/ICU TEAM  Adam [name]  ZOX:[id number] DOB: 04-Oct-1923 DOA: 2018/03/02 PCP: Lucky Cowboy, MD    Brief Narrative:  84 year old male with a hx of CAD, AoS with a valve area of 1 but peak gradient of 35, atrial fibrillation on Coumadin, and CKD stage IV who presented w/ 3 days of shaking chills and shortness of breath. Patient was seen by his PCP and started on doxycycline.   In the ED he was found to have a leukocytosis, GFR of 17, and a CXR noting perihilar infiltrates suggestive of pneumonia versus edema.  EKG reveals atrial fibrillation at 100.  Troponin was 4.  ----------------------------------------------------------------------------------------------------------------------------------- Significant Events: 4/28 admit  Subjective:  Patient was seen and examined this morning.  Still on BiPAP, was mildly tachycardic He was awake and alert, was not complaining of any chest pain. Patient's son and wife was present at bedside They were notified that his INR was elevated yesterday status post vitamin K treatment improved today, no signs of bleeding. Trial of weaning off BiPAP as failed yesterday. They were informed that his electrolyte, kidney function is getting worse   -----------------------------------------------------------------------------------------------------------------------------------  Assessment & Plan:  Supratherapeutic INR -Holding Coumadin -His INR was as high 7.15, status post 2 mg vitamin K, today INR is 4.11, no signs of bleeding, will continue to monitor  Parox atrial fibrillation with acute RVR Chadsvasc is 5 - on chronic warfarin - Cards following - Restarted amiodarone heart rate 90- 110, -Not on any beta-blocker due to respiratory failure -  Acute respiratory failure -Patient failed weaning off BiPAP yesterday once again today, -Hypoxia likely exacerbated with severe sepsis, multilobar pneumonia -Continue BiPAP,  repeated ABGs, patient is in respiratory acidosis -Family has confirmed DNR status -We will continue the patient on IV antibiotics BiPAP -Continue IV Solu-Medrol, Xopenex DuoNeb bronchodilator treatment (alternative to albuterol as patient has a history of atrial fibrillation with RVR, to minimize tachycardia)  Severe Sepsis and acute hypoxic respiratory failure due to Multilobar Pneumonia Cont empiric abx and follow clinically -failed being weaned off BiPAP -Blood cultures negative to date, urine cultures growing enterococcus faecalis -Continue antibiotics of Maxipime  -Limited use of vancomycin as his creatinine is getting worse  NSTEMI Not a candidate for cardiac cath due to obvious reasons - Cards directing medical care   AKI on CKD stage IV Baseline creatinine approximately 2 -  Recent Labs  Lab 02/19/2018 0054 03/01/18 0233 03/02/18 0400 03/03/18 0237 03/25/2018 0333  CREATININE 3.55* 3.19* 2.60* 2.75* 3.38*   -Elevated sodium to 152, worsening BUN/creatinine -Neurology following -Recommended -gentle hypotonic IV fluid D5 1/4 ormal saline -Patient is on sodium bicarb 1300 mg twice daily  Hypernatremia -Nephrology following -Commended to avoid Lasix -  Chronic diastolic CHF TTE 06/02/18 noted EF 60-65% - appears volume depleted at this time - gently hydrate - follow weights   Filed Weights   03/02/18 0350 03/03/18 0500 03/22/2018 0500  Weight: 71 kg (156 lb 8.4 oz) 69 kg (152 lb 1.9 oz) 70.9 kg (156 lb 4.9 oz)    Moderate aortic stenosis -Trend closely,  -cardiology following  DM 2 CBG reasonably controlled -  Hyperglycemia as patient on D51/4 NS per nephrology recommendations, also on Solu-Medrol, will add a long-acting insulin -We will continue SSI,  HTN BP currently controlled  HLD -Continue statins  Nasal congestion,  - NS nasal spray, Afrin, Flonase   Due to declining health, debility, unable to wean off BiPAP, progressing to decline physically and  mentally -  we will consult palliative care team  DVT prophylaxis: warfarin  Code Status: DNR - NO CODE Family Communication: Patient wife and son was present at bedside, all the findings were discussed in detail with the patient family.  Confirmed DNR/DNI status, will proceed with consulting palliative care Disposition Plan: To be determined  Consultants:  Nephrology Cardiology   Antimicrobials:  Cefepime 4/28 > Vancomycin 4/28 > 4/29  Objective: Blood pressure (!) 112/49, pulse 66, temperature 98.1 F (36.7 C), temperature source Axillary, resp. rate 20, height  (1.702 m), weight 70.9 kg (156 lb 4.9 oz), SpO2 (!) 89 %.  Intake/Output Summary (Last 24 hours) at 2018/03/30 1325 Last data filed at 03/30/2018 0600 Gross per 24 hour  Intake 2139.31 ml  Output 250 ml  Net 1889.31 ml   Filed Weights   03/02/18 0350 03/03/18 0500 2018/03/30 0500  Weight: 71 kg (156 lb 8.4 oz) 69 kg (152 lb 1.9 oz) 70.9 kg (156 lb 4.9 oz)    Examination: General: Awake on BiPAP, follows some commands.   Lungs: Few's rhonchi, with mild wheezing, mild crackles at the bases, positive breath sounds Cardiovascular: Irregularly irregular mildly tacky S1-S2, no murmurs Abdomen: Soft nontender nondistended positive bowel sounds Extremities: No significant cyanosis, clubbing, or edema bilateral lower extremities  CBC: Recent Labs  Lab 02/07/2018 0607  03/01/18 0233 03/02/18 0400 2018-03-30 0333  WBC 16.4*   < > 11.8* 8.8 16.9*  NEUTROABS 13.6*  --   --   --   --   HGB 9.7*   < > 9.0* 9.2* 9.8*  HCT 31.7*   < > 27.4* 28.5* 31.0*  MCV 101.9*   < > 95.1 96.3 99.0  PLT 235   < > 199 233 226   < > = values in this interval not displayed.   Basic Metabolic Panel: Recent Labs  Lab 03/02/18 0400 03/03/18 0237 03-30-18 0333  NA 145 151* 152*  K 3.5 3.7 3.8  CL 110 114* 117*  CO2 19* 21* 17*  GLUCOSE 271* 373* 431*  BUN 90* 112* 140*  CREATININE 2.60* 2.75* 3.38*  CALCIUM 8.0* 8.1* 8.3*  PHOS 4.9*  5.5* 7.1*   GFR: Estimated Creatinine Clearance: 13 mL/min (A) (by C-G formula based on SCr of 3.38 mg/dL (H)).  Liver Function Tests: Recent Labs  Lab 03/01/18 0233 03/02/18 0400 03/03/18 0237 Mar 30, 2018 0333  AST  --   --   --  68*  ALT  --   --   --  485*  ALKPHOS  --   --   --  130*  BILITOT  --   --   --  1.6*  PROT  --   --   --  6.4*  ALBUMIN 2.1* 2.1* 2.2* 2.3*  2.2*   No results for input(s): LIPASE, AMYLASE in the last 168 hours. No results for input(s): AMMONIA in the last 168 hours.  Coagulation Profile: Recent Labs  Lab 03/01/18 0233 03/02/18 0400 03/03/18 0237 03/03/18 0929 03-30-18 0333  INR 4.38* 5.31* 7.15* 7.65* 4.11*    Cardiac Enzymes: Recent Labs  Lab 02/04/2018 2031 02/20/2018 0616 02/27/2018 1035 02/09/2018 1649 02/27/2018 2320  TROPONINI 9.13* 5.54* 4.88* 3.83* 2.78*    HbA1C: Hgb A1c MFr Bld  Date/Time Value Ref Range Status  02/12/2018 09:35 AM 6.9 (H) 4.8 - 5.6 % Final    Comment:    (NOTE) Pre diabetes:          5.7%-6.4% Diabetes:              >  6.4% Glycemic control for   <7.0% adults with diabetes   02/08/2018 05:03 PM 7.1 (H) <5.7 % of total Hgb Final    Comment:    For someone without known diabetes, a hemoglobin A1c value of 6.5% or greater indicates that they may have  diabetes and this should be confirmed with a follow-up  test. . For someone with known diabetes, a value <7% indicates  that their diabetes is well controlled and a value  greater than or equal to 7% indicates suboptimal  control. A1c targets should be individualized based on  duration of diabetes, age, comorbid conditions, and  other considerations. . Currently, no consensus exists regarding use of hemoglobin A1c for diagnosis of diabetes for children. .     CBG: Recent Labs  Lab 03/03/18 1153 03/03/18 1714 03/03/18 2104 03/19/2018 0757 03/31/2018 1212  GLUCAP 322* 395* 372* 395* 353*      Scheduled Meds: . guaiFENesin-dextromethorphan  10 mL  Oral Q6H  . insulin aspart  0-5 Units Subcutaneous QHS  . insulin aspart  0-9 Units Subcutaneous TID WC  . insulin glargine  20 Units Subcutaneous BID  . lanthanum  1,000 mg Oral TID WC  . levalbuterol  1.25 mg Nebulization Q6H  . levothyroxine  88 mcg Oral QAC breakfast  . methylPREDNISolone (SOLU-MEDROL) injection  40 mg Intravenous Q8H  . oxymetazoline  1 spray Each Nare BID  . sodium bicarbonate  1,300 mg Oral BID  . Warfarin - Pharmacist Dosing Inpatient   Does not apply q1800     LOS: 5 days   If 7PM-7AM, please contact night-coverage [email or web address] Password [email or web address] 03/07/2018, 1:25 PM

## 2018-04-02 NOTE — Progress Notes (Signed)
Subjective: Interval History: has no complaint, still coughing up phlegm.  Objective: Vital signs in last 24 hours: Temp:  [96.8 F (36 C)-98.5 F (36.9 C)] 98.1 F (36.7 C) (05/03 0338) Pulse Rate:  [78-109] 78 (05/03 0600) Resp:  [25-39] 29 (05/03 0600) BP: (120-167)/(49-107) 120/54 (05/03 0600) SpO2:  [89 %-99 %] 89 % (05/03 0600) FiO2 (%):  [40 %-60 %] 60 % (05/03 0303) Weight:  [70.9 kg (156 lb 4.9 oz)] 70.9 kg (156 lb 4.9 oz) (05/03 0500) Weight change: 1.9 kg (4 lb 3 oz)  Intake/Output from previous day: 05/02 0701 - 05/03 0700 In: 2486.6 [P.O.:540; I.V.:1746.6; IV Piggyback:200] Out: 250 [Urine:250] Intake/Output this shift: No intake/output data recorded.  General appearance: alert, pale and on BIPAP Resp: rales bibasilar Cardio: irregularly irregular rhythm and systolic murmur: systolic ejection 2/6, decrescendo at 2nd left intercostal space GI: soft, non-tender; bowel sounds normal; no masses,  no organomegaly Extremities: extremities normal, atraumatic, no cyanosis or edema  Lab Results: Recent Labs    03/02/18 0400 03/22/2018 0333  WBC 8.8 16.9*  HGB 9.2* 9.8*  HCT 28.5* 31.0*  PLT 233 226   BMET:  Recent Labs    03/03/18 0237 03/08/2018 0333  NA 151* 152*  K 3.7 3.8  CL 114* 117*  CO2 21* 17*  GLUCOSE 373* 431*  BUN 112* 140*  CREATININE 2.75* 3.38*  CALCIUM 8.1* 8.3*   No results for input(s): PTH in the last 72 hours. Iron Studies: No results for input(s): IRON, TIBC, TRANSFERRIN, FERRITIN in the last 72 hours.  Studies/Results: No results found.  I have reviewed the patient's current medications.  Assessment/Plan: 1 CKD  Suspect not far from baseline GFR.  Main concern is ^ SNa, which appears to be mild hypervolemic hypernatremia. Need to avoid Lasix 2 pneumonia severe and not much better. On AB 3 Debill 4 Afib 5 CAD 6 DM needs good control  On steroids and D5 7 Anemia stable 8 AS P Cont AB, avoid diuretics,  Cont Free water    LOS: 5 days   Blease Grey Schlauch 04/01/2018,7:40 AM

## 2018-04-02 NOTE — Plan of Care (Signed)
Pt required an increase from 40% to 60% O2 on the BiPAP overnight and the pt's HR remained tachycardic in the 110's. Pt does not appear to be progressing adequately.

## 2018-04-02 NOTE — Progress Notes (Addendum)
Progress Note  Patient Name: Adam [name] Date of Encounter: 03/19/2018  Primary Cardiologist: No primary care provider on file.  Subjective   Patient has been difficult to arouse all morning according to notes and now placed back in Bipap. Family at the bedside.   Inpatient Medications    Scheduled Meds: . guaiFENesin-dextromethorphan  10 mL Oral Q6H  . insulin aspart  0-5 Units Subcutaneous QHS  . insulin aspart  0-9 Units Subcutaneous TID WC  . insulin glargine  20 Units Subcutaneous BID  . lanthanum  1,000 mg Oral TID WC  . levalbuterol  1.25 mg Nebulization Q6H  . levothyroxine  88 mcg Oral QAC breakfast  . methylPREDNISolone (SOLU-MEDROL) injection  40 mg Intravenous Q8H  . oxymetazoline  1 spray Each Nare BID  . sodium bicarbonate  1,300 mg Oral BID  . Warfarin - Pharmacist Dosing Inpatient   Does not apply q1800   Continuous Infusions: . sodium chloride 10 mL/hr at 03/02/18 0155  . amiodarone 30 mg/hr (03/29/2018 0214)  . ceFEPime (MAXIPIME) IV Stopped (03/11/2018 1209)  . dextrose 50 mL/hr at 03/05/2018 0826   PRN Meds: acetaminophen, acetaminophen, ALPRAZolam, benzonatate, haloperidol lactate, ondansetron (ZOFRAN) IV, sodium chloride   Vital Signs    Vitals:   03/28/2018 0500 03/16/2018 0600 03/05/2018 0818 03/19/2018 1213  BP: (!) 149/64 (!) 120/54 109/61 (!) 112/49  Pulse: (!) 106 78  66  Resp: (!) 36 (!) 29  20  Temp:      TempSrc:      SpO2: 94% (!) 89%    Weight: 156 lb 4.9 oz (70.9 kg)     Height:        Intake/Output Summary (Last 24 hours) at 03/03/2018 1256 Last data filed at 03/15/2018 0600 Gross per 24 hour  Intake 2139.31 ml  Output 250 ml  Net 1889.31 ml   Filed Weights   03/02/18 0350 03/03/18 0500 03/22/2018 0500  Weight: 156 lb 8.4 oz (71 kg) 152 lb 1.9 oz (69 kg) 156 lb 4.9 oz (70.9 kg)    Telemetry    Afib Rate 90-100s - Personally Reviewed  Physical Exam   General: Frail, ill appearing older WM, now back on Bipap, does not arouse to  verbal stimuli  Head: Normocephalic, atraumatic.  Neck: Supple, no JVD Lungs:  Resp regular,unlabored, wheezing anteriorly. Heart: Irreg Irreg, S1, S2, soft systolic murmur; no rub. Abdomen: Soft, non-tender, non-distended with normoactive bowel sounds.  Extremities: No clubbing, cyanosis, edema. Distal pedal pulses are 2+ bilaterally. Neuro: He does not respond to verbal stimuli, eye closed.  Labs    Chemistry Recent Labs  Lab 03/02/18 0400 03/03/18 0237 03/16/2018 0333  NA 145 151* 152*  K 3.5 3.7 3.8  CL 110 114* 117*  CO2 19* 21* 17*  GLUCOSE 271* 373* 431*  BUN 90* 112* 140*  CREATININE 2.60* 2.75* 3.38*  CALCIUM 8.0* 8.1* 8.3*  PROT  --   --  6.4*  ALBUMIN 2.1* 2.2* 2.3*  2.2*  AST  --   --  68*  ALT  --   --  485*  ALKPHOS  --   --  130*  BILITOT  --   --  1.6*  GFRNONAA 20* 19* 14*  GFRAA 23* 22* 17*  ANIONGAP 16* 16* 18*     Hematology Recent Labs  Lab 03/01/18 0233 03/02/18 0400 03/03/2018 0333  WBC 11.8* 8.8 16.9*  RBC 2.88* 2.96* 3.13*  HGB 9.0* 9.2* 9.8*  HCT 27.4* 28.5* 31.0*  MCV 95.1 96.3 99.0  MCH 31.3 31.1 31.3  MCHC 32.8 32.3 31.6  RDW 14.7 15.2 15.9*  PLT 199 233 226    Cardiac Enzymes Recent Labs  Lab 2018-03-18 0616 2018/03/18 1035 18-Mar-2018 1649 03-18-18 2320  TROPONINI 5.54* 4.88* 3.83* 2.78*    Recent Labs  Lab 02/05/2018 0612  TROPIPOC 4.13*     BNP Recent Labs  Lab 02/09/2018 0607  BNP 1,044.6*     DDimer No results for input(s): DDIMER in the last 168 hours.    Radiology    Dg Chest Port 1 View  Result Date: 03/28/2018 CLINICAL DATA:  Shortness of breath, labored breathing and wheezing EXAM: PORTABLE CHEST 1 VIEW COMPARISON:  Portable chest x-ray of 03/02/2018 FINDINGS: There has been worsening of diffuse airspace disease throughout the lungs most consistent with pneumonia, with some sparing of the perihilar regions. Small effusions cannot be excluded. Cardiomegaly is stable. No bony abnormality is noted. IMPRESSION:  Worsening of diffuse airspace disease most consistent with pneumonia. Electronically Signed   By: Dwyane Dee M.D.   On: 03/06/2018 10:23    Cardiac Studies   N/a   Patient Profile     84 y.o. male with PAF, HTN, CKD IV, CAD s/p PCI 1990s (last cath 2000 -> med rx), moderate AS. Recently seen by PCP for f/u ?Cr at which time he reported DOE/fatigue. The next day he developed cough/hemoptysis with CXR suggesting PNA. Despite initiation of abx he declined and was subsequently admitted with acute respiratory failure requiring bipap, CAP, AKI on CKD, leukocytosis, recurrent AF RVR, and elevated troponin.  Assessment & Plan    1. Paroxysmal atrial fibrillation - amio stopped PTA 01/31/18 2/2 low TSH. Restarted upon admission - initially converted to NSR but went back into AF/flutter so was restarted on amiodarone. He is maintaining a HR of 90-110 which is not unexpected given the degree of respiratory illness. No room for BB given wheezing. Given his acute illness, hypernatremia, and hypoxia will be difficult maintain NSR unless he clinically improves. The last assessment we have of EF was prior to this admission (4/1 - normal). IM has given vitamin K for persistently elevated INR. INR 4.11 today.  2. NSTEMI - troponin peak 10.4, possibly related to demand ischemia in the setting of respiratory failure. The patient is not a candidate for cardiac catheterization with advanced kidney disease at age 21. Aspirin dc'd 4/30 by IM probably 2/2 elevated INR. Can resume when more stable. Statin is also on hold - f/u LFTs today remain elevated.   3. AKI on CKD stage IV - nephrology following. Initially patient was treated with diuresis for pulm edema -> now sodium 152 with rising BUN/Cr. Cr 3.38 today.  4. Sepsis/acute respiratory failure in setting of acute diastolic CHF, multilobar PNA, AF - as above. CXR 5/1 interim partial clearing of pulm edema. Was able to wean from Bipap yesterday, but was lethargic and  unable to arouse this morning. Now back on Bipap with worsening respiratory acidosis. WBC >>16.9 and CXR with worsening PNA.  5. Anemia - hemoglobin appears slightly below recent baseline, further monitoring per primary team.  6. Mod AS by echo 01/31/18 - follow clinically, but not a surgical candidate should the need arise in the future.  Signed, Laverda Page, NP  03/03/2018, 12:56 PM  Pager # [phone]   Patient seen, examined. Available data reviewed. Agree with findings, assessment, and plan as outlined by Laverda Page, NP.  Chart reviewed.  The patient has taken  a turn for the worse.  He has failed weaning from BiPAP.  He has become less responsive and is difficult to arouse.   Palliative care has been consulted.  I had a lengthy discussion with his son at the bedside.  His son is coming to terms with his dad's clinical deterioration and he understands that he now exhibits signs of multiorgan failure.  All seem to be in agreement with a palliative approach to his care. Otherwise as outlined above.   Tonny Bollman, M.D. 03/07/2018 3:35 PM   For questions or updates, please contact [facility] Please consult [email or web address] for contact info under Cardiology/STEMI.

## 2018-04-02 NOTE — Discharge Summary (Signed)
  Patient expired when I was off service.  Please see nurse's note from 03/05/2018 at  1920.

## 2018-04-02 NOTE — Death Summary Note (Addendum)
  Patient passed away at [facility] time: 1920 on 03/05/2018. Nurses note were reviewed.  To RN at verified pronounce Mr. Adam [name] was subsequently pronounced deceased. (two RN verification of death was made by Villa Herb and Einar Gip)   Patient was in respiratory failure due to multiple factors, despite all measures fail BiPAP.  Subsequently patient family requested for BiPAP to be determined off, patient mentation apparently progressed.  Respiratory effort continued to diminish progressed to agonal breathing subsequently patient, stop breathing and passed a repeat  Immediate cause of death thought to be respiratory failure multifactorial: due to severe sepsis, leading to hypoxia, multilobar pneumonia,dCHF, NSTEMI,   Braven Wolk MD

## 2018-04-02 NOTE — Progress Notes (Signed)
CSW received phone call from attending about reaching out to Palliative Care and/or Chaplain about having someone speak with family members.   CSW reached out to Palliative Care. CSW spoke with on call MD with Palliative. Palliative care will prioritize pt for tomorrow morning.   CSW paged and spoke with Chaplain. Chaplain stated she will see pt and pt's family.   Montine Circle, [facility]  [phone]

## 2018-04-02 NOTE — Progress Notes (Signed)
Discussed pt with MD and RN.  Pt taken off NIV, HFNC to keep SpO2 88-92%.  WOB about the same off as on, mild to  Moderate use of accessory muscles.  Obtain ABG if O2 requirement significantly increases (increased to 14 LPM at this time), or WOB becomes more labored.  Pt resting w th eyes closed, wakes to voice.  Continue to monitor closely.

## 2018-04-02 NOTE — Progress Notes (Signed)
Patient's family discussed with Chaplain about current health status of patient. Following that discussion the family approached this RN about discontinuing Bipap, monitor, and iv pump, and letting the patient transition to comfort care. This RN provided education to the family and provided emotional support.The family made the decision to remove all equipment. This RN then paged the MD S. Shahmehdi and let him know about the family's decision. The MD returned the call and gave the OK to remove equipment. This RN removed all the equipment and gave the patient PRN morphine for comfort. Will pass this information to night shift nurse.

## 2018-04-02 NOTE — Progress Notes (Signed)
1905- Pt off of BiPAP with agonal, periodic breathing during shift change and hand off report. Pt sonRosanne [name], at bed side. This nurse and day shift nurse Venita Sheffield, RN went over the plan of care for the night with the son.   1920- Pt passed away, a two RN verification of death was made by Villa Herb and Einar Gip. The family was notified. On call NP Blount was notified. The pt's son, Adam [name], requested that the pt keep their wedding band on. The family would like for the pt to go to the [address] location of the [facility].   2000- Post mortem care has been preformed and [place] donor services have been contacted.    0981 Death certificate has been signed by NP Blount.   Pt to be transferred to morgue once transfer bed obtained.

## 2018-04-02 NOTE — Progress Notes (Signed)
ANTICOAGULATION CONSULT NOTE - Follow Up Consult  Pharmacy Consult for Warfarin Indication: atrial fibrillation   No Known Allergies  Vital Signs: Temp: 98.1 F (36.7 C) (05/03 0338) Temp Source: Axillary (05/03 0338) BP: 120/54 (05/03 0600) Pulse Rate: 78 (05/03 0600)  Labs: Recent Labs    03/02/18 0400 03/03/18 0237 03/03/18 0929 2018/03/21 0333  HGB 9.2*  --   --  9.8*  HCT 28.5*  --   --  31.0*  PLT 233  --   --  226  LABPROT 48.3* 60.9* 64.2* 39.6*  INR 5.31* 7.15* 7.65* 4.11*  CREATININE 2.60* 2.75*  --  3.38*   Estimated Creatinine Clearance: 13 mL/min (A) (by C-G formula based on SCr of 3.38 mg/dL (H)).  Assessment: 84 y/o M on chronic anticoagulation with coumadin for atrial fibrillation.  His INR was 3.09 on admission on a regimen of Coumadin 2.5mg  daily except 1.25mg  on Mon PTA. His INR is now up to 7.15 despite giving reduced doses of Coumadin and holding Coumadin 4/30 and 5/1.    His hemoglobin was stable 5/1, CBC ordered for tomorrow. No bleeding noted.  Goal of Therapy:  INR 2-3 Monitor platelets by anticoagulation protocol: Yes   Plan:  Will continue to hold Coumadin tonight Monitor daily INR, CBC Monitor for s/sx of bleeding   Tera Mater, PharmD Mar 21, 2018

## 2018-04-02 NOTE — Progress Notes (Signed)
Pt decreased SpO2 to 70s (on monitor), pt difficult to arouse.  ABG obtained 7.13/52/63/HCO3- 16, BD -10.  Will notify MD.  Pt placed back on NIV, able to wean FIO2, WOB less.  Pt continues to be difficult to arouse.  Discussed with RN

## 2018-04-02 NOTE — Progress Notes (Signed)
Pt HR increasing to 110's-120's. Pt BP stable 144/70. On call NP Bodenheimer notified. No new orders at this time.

## 2018-04-02 NOTE — Progress Notes (Signed)
CRITICAL VALUE ALERT  Critical Value: INR 4.11  Date & Time Notied: 03/07/2018 0459  Provider Notified: On call NP Bodenheimer   Orders Received/Actions taken: No new orders

## 2018-04-02 NOTE — Progress Notes (Signed)
I shared with family that they are honoring the patient by following his wishes that he doesn't want to be on tubes and following their wishes not to see him with all of that.  They just decided to leave the room so they dont have to see things removed one son will take mom home.  Phebe Colla, Chaplain   03/16/2018 1800  Clinical Encounter Type  Visited With Patient and family together  Visit Type Spiritual support;Critical Care

## 2018-04-02 NOTE — Progress Notes (Signed)
Inpatient Diabetes Program Recommendations  AACE/ADA: New Consensus Statement on Inpatient Glycemic Control (2015)  Target Ranges:  Prepandial:   less than 140 mg/dL      Peak postprandial:   less than 180 mg/dL (1-2 hours)      Critically ill patients:  140 - 180 mg/dL   Lab Results  Component Value Date   GLUCAP 395 (H) 03/20/2018   HGBA1C 6.9 (H) 2018-03-05    Review of Glycemic ControlResults for OVIE, CORNELIO (MRN [id number]) as of 03/29/2018 10:13  Ref. Range 03/03/2018 07:48 03/03/2018 11:53 03/03/2018 17:14 03/03/2018 21:04 03/23/2018 07:57  Glucose-Capillary Latest Ref Range: 65 - 99 mg/dL 409 (H) 811 (H) 914 (H) 372 (H) 395 (H)   Diabetes history: Type 2 DM  Outpatient Diabetes medications: None Current orders for Inpatient glycemic control:  Novolog sensitive tid with meals and HS Solumedrol 40 mg IV q 6 hours, Lantus 20 units bid Inpatient Diabetes Program Recommendations:   Note elevated blood sugars with steroids.  Note however that A1C very good prior to admit.  As steroids are tapered, please reduce insulin as well.  Also may consider increasing frequency of Novolog to sensitive q 4 hours while on steroids if appropriate.   Thanks,  Beryl Meager, RN, BC-ADM Inpatient Diabetes Coordinator Pager [phone]

## 2018-04-02 DEATH — deceased

## 2018-05-31 ENCOUNTER — Ambulatory Visit: Payer: Self-pay | Admitting: Internal Medicine

## 2018-11-24 ENCOUNTER — Encounter: Payer: Self-pay | Admitting: Internal Medicine

## 2019-02-20 ENCOUNTER — Ambulatory Visit: Payer: Self-pay | Admitting: Adult Health

## 2019-03-06 IMAGING — CR DG CHEST 2V
2 series · 2 of 2 positions shown · non-contrast
Comparison: 12/27/2017

CLINICAL DATA: Follow-up pneumonia

EXAM:
CHEST - 2 VIEW

[chest pa]
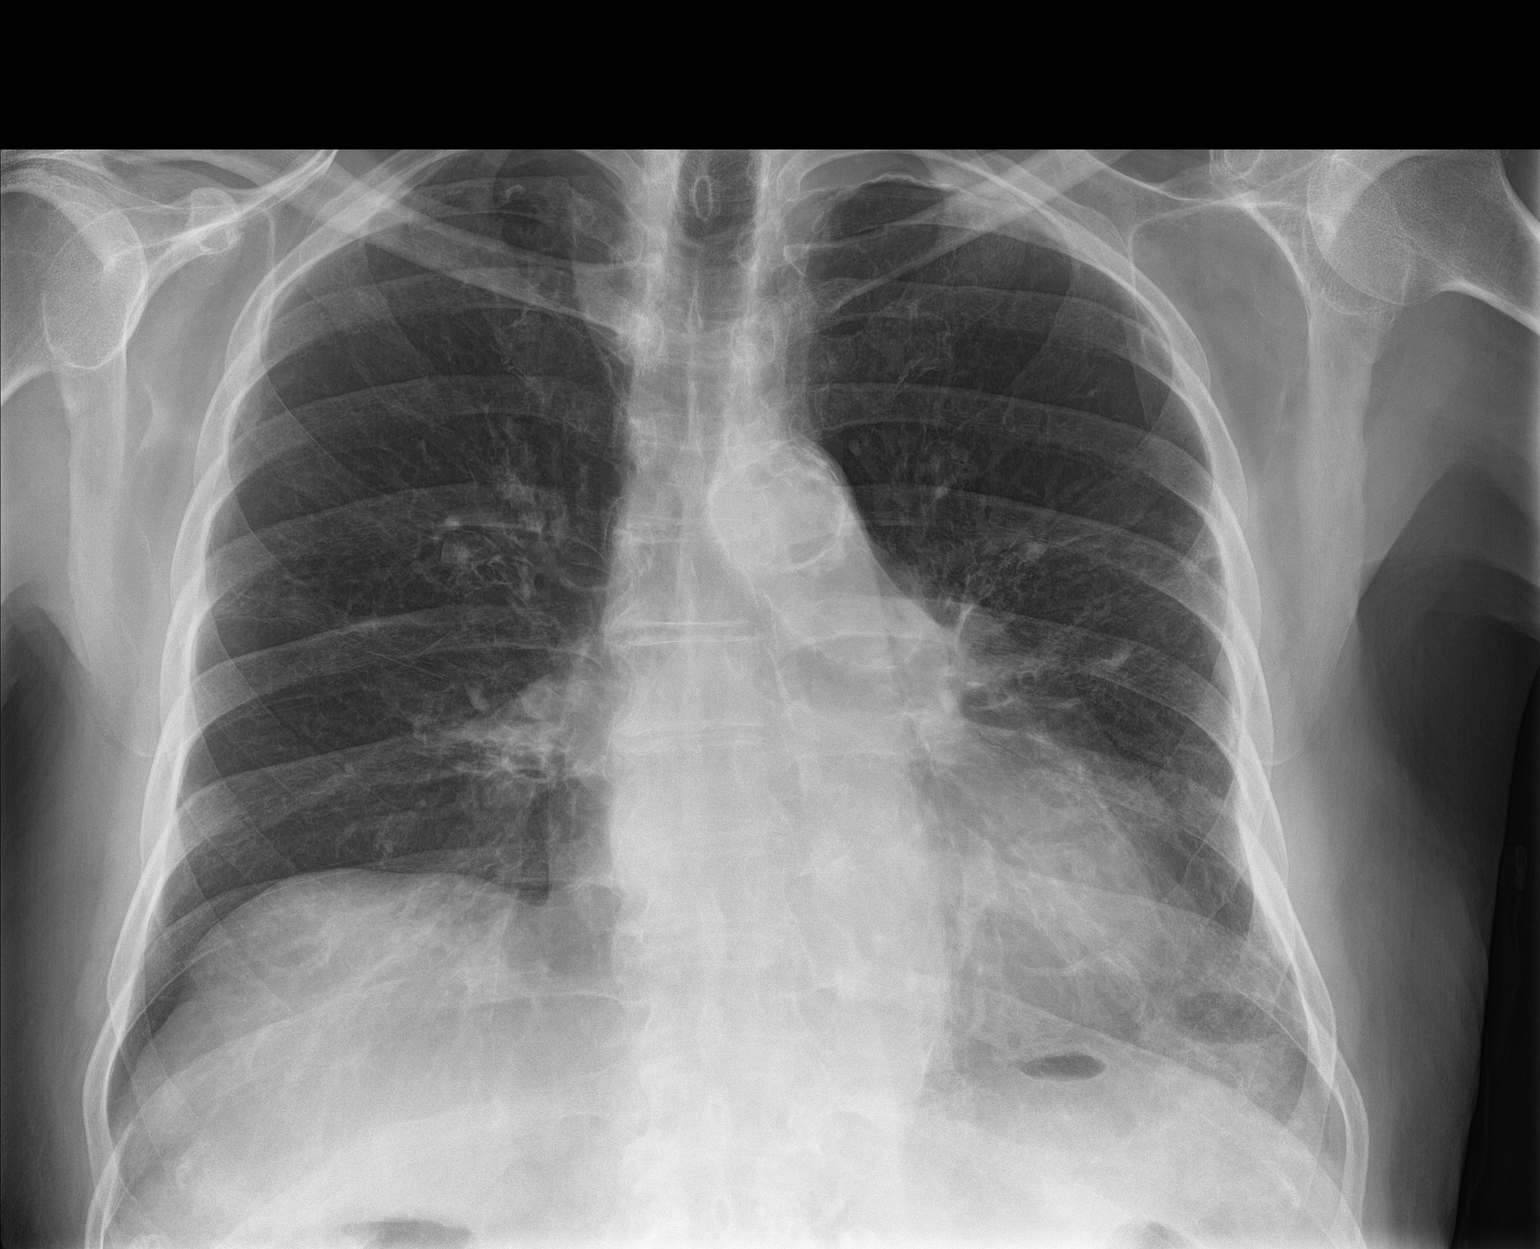

[chest lat]
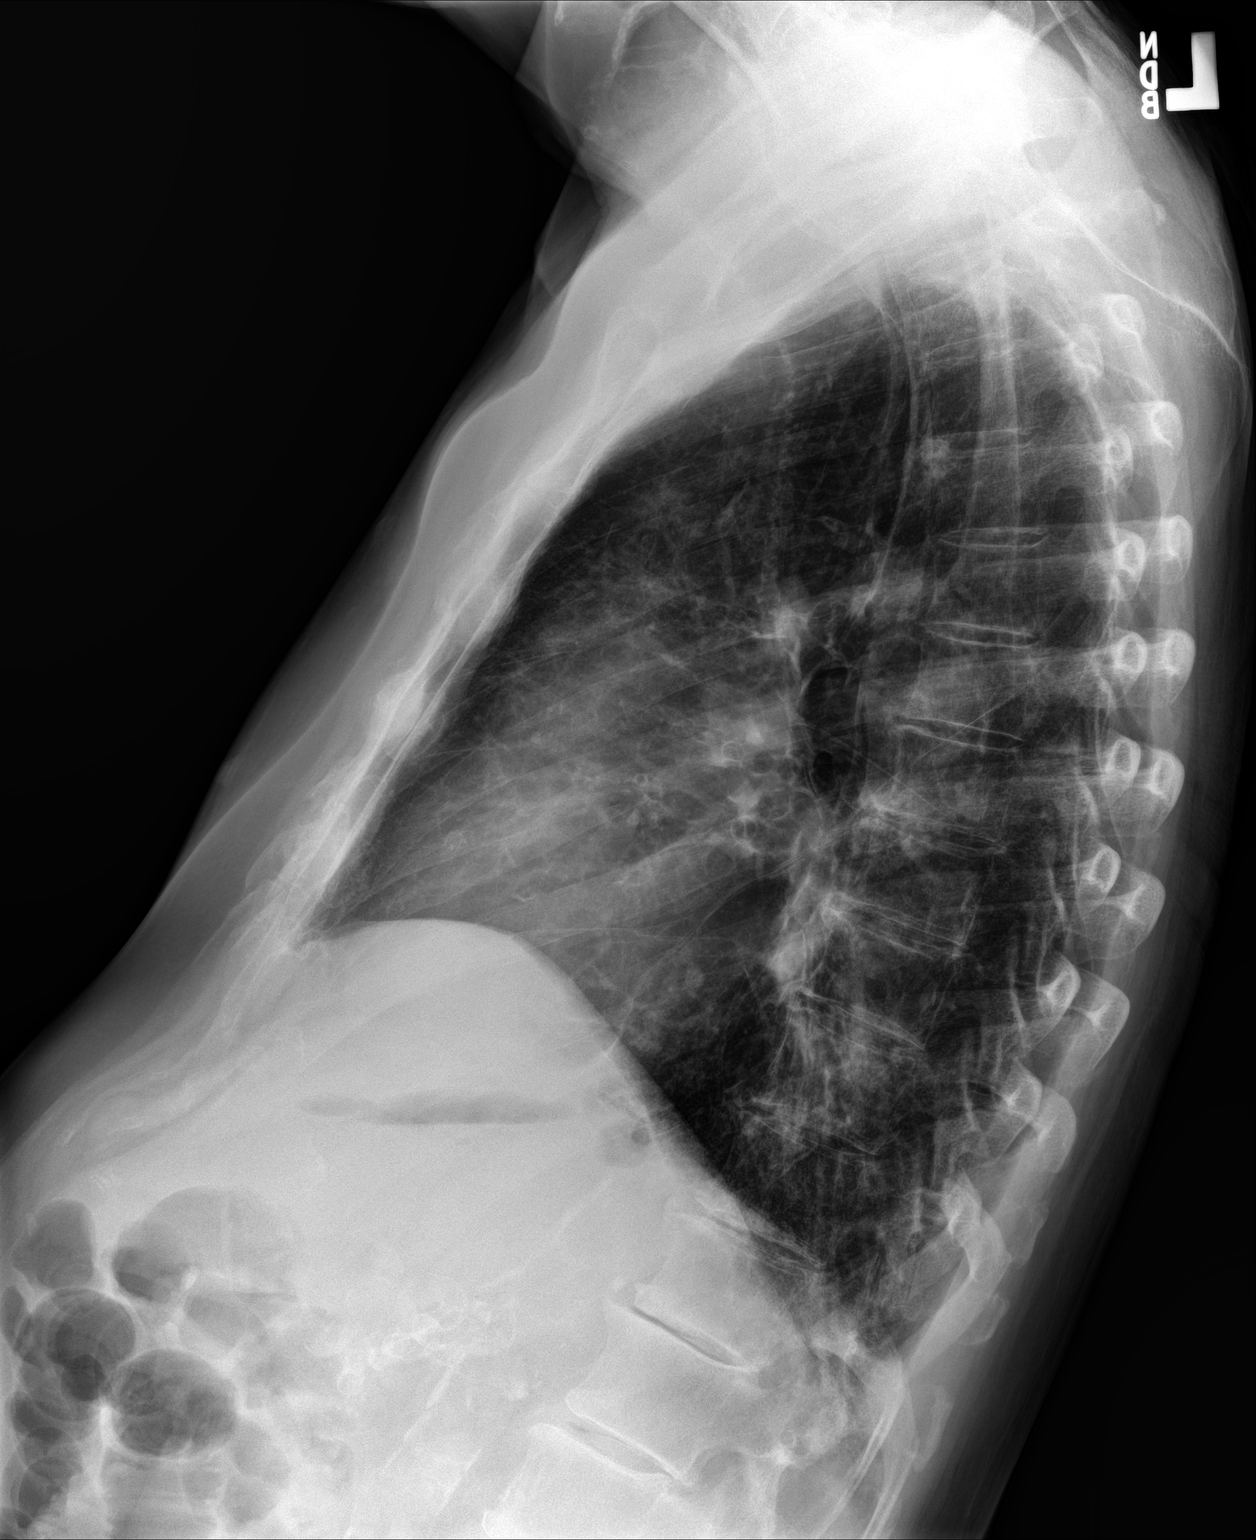

[2 of 2 positions shown; findings below may reference images not displayed]

FINDINGS: Enlargement of cardiac silhouette.

Atherosclerotic calcification aorta.

Mediastinal contours and pulmonary vascularity normal.

Bronchitic and emphysematous changes consistent with COPD.

Significantly improved LEFT lower lung infiltrate.

No pleural effusion or pneumothorax.

Bones demineralized.
IMPRESSION: Significantly improved LEFT lung base infiltrate.

## 2019-03-24 IMAGING — DX DG CHEST 1V PORT
1 series · 1 of 1 positions shown · non-contrast
Comparison: 02/25/2018

CLINICAL DATA: Shortness of breath

EXAM:
PORTABLE CHEST 1 VIEW

[chest ap]
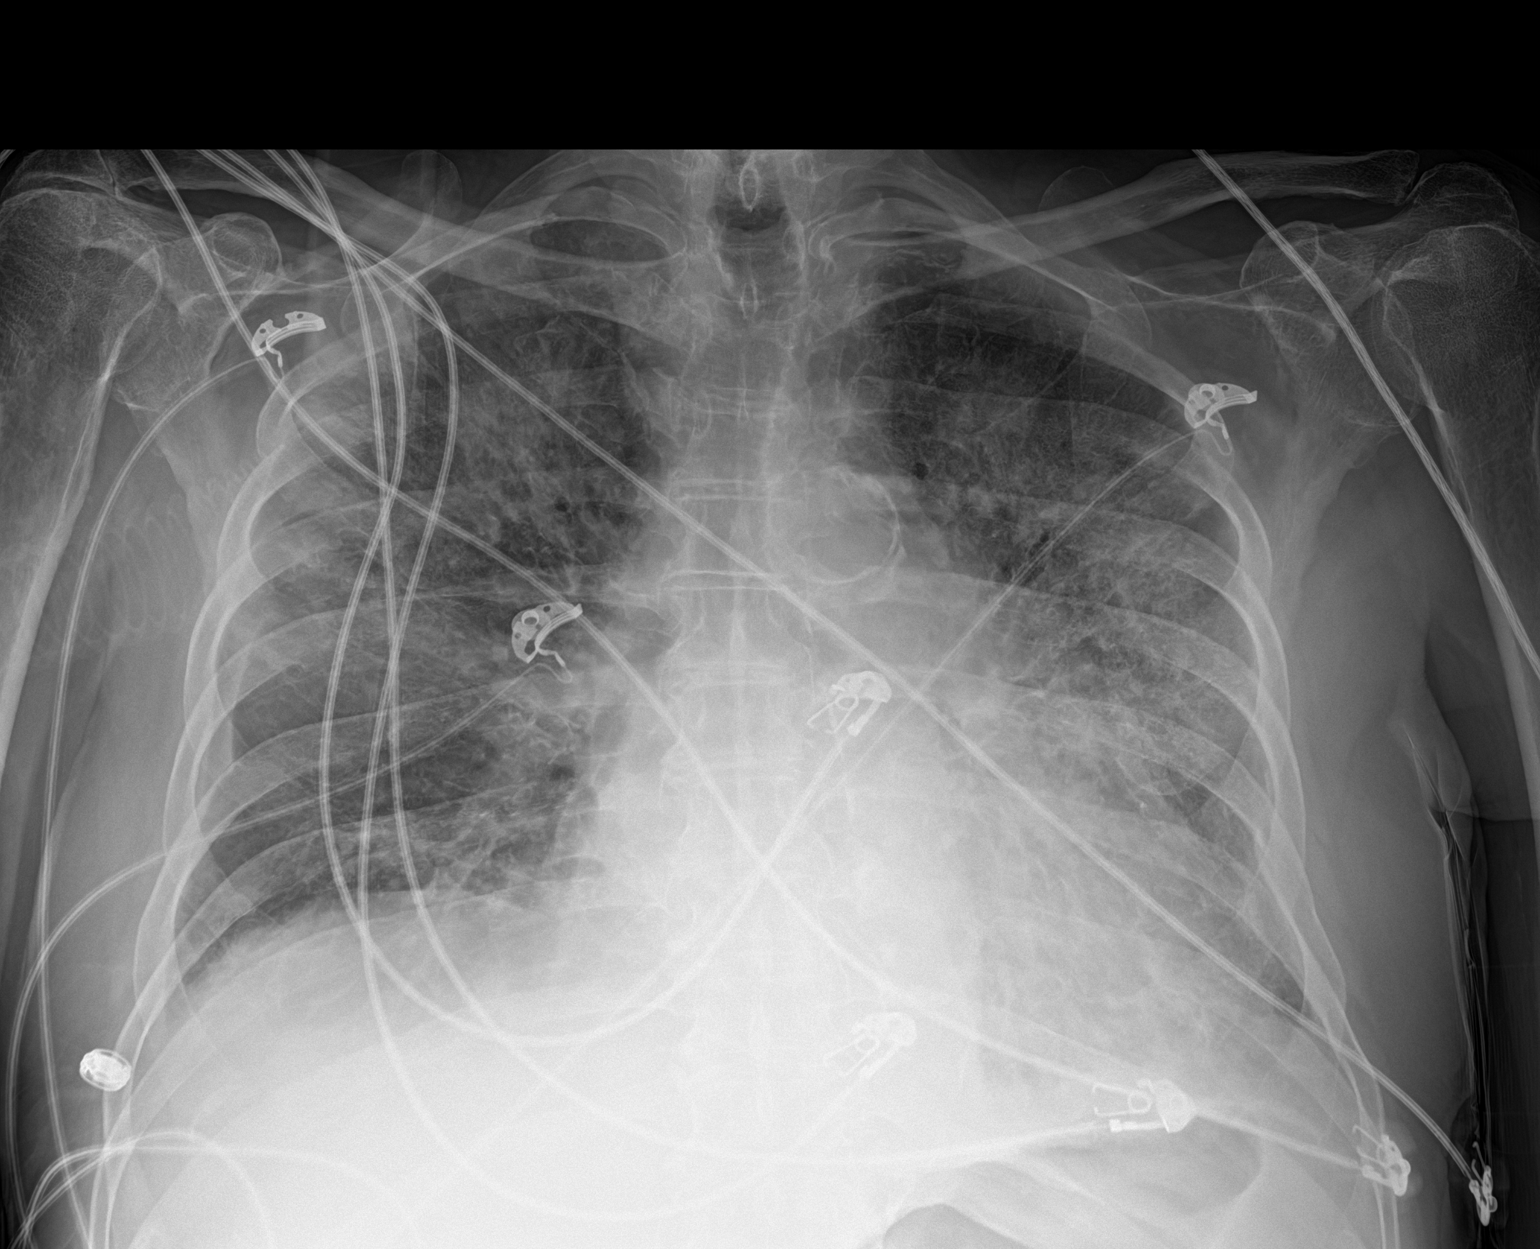

[1 of 1 positions shown; findings below may reference images not displayed]

FINDINGS: Shallow inspiration. Cardiac enlargement. Perihilar interstitial and
alveolar infiltrates progressing since previous study. This suggests
developing multifocal pneumonia or edema. Underlying chronic
bronchitic changes. No blunting of costophrenic angles. No
pneumothorax. Mediastinal contours appear intact. Calcification of
the aorta.
IMPRESSION: Increasing perihilar interstitial and alveolar infiltrates suggest
developing multifocal pneumonia or edema. Underlying chronic
bronchitic changes. Aortic atherosclerosis.

## 2019-03-27 IMAGING — DX DG CHEST 1V PORT
1 series · 1 of 1 positions shown · non-contrast
Comparison: 02/28/2018

CLINICAL DATA: Pneumonia

EXAM:
PORTABLE CHEST 1 VIEW

[chest ap]
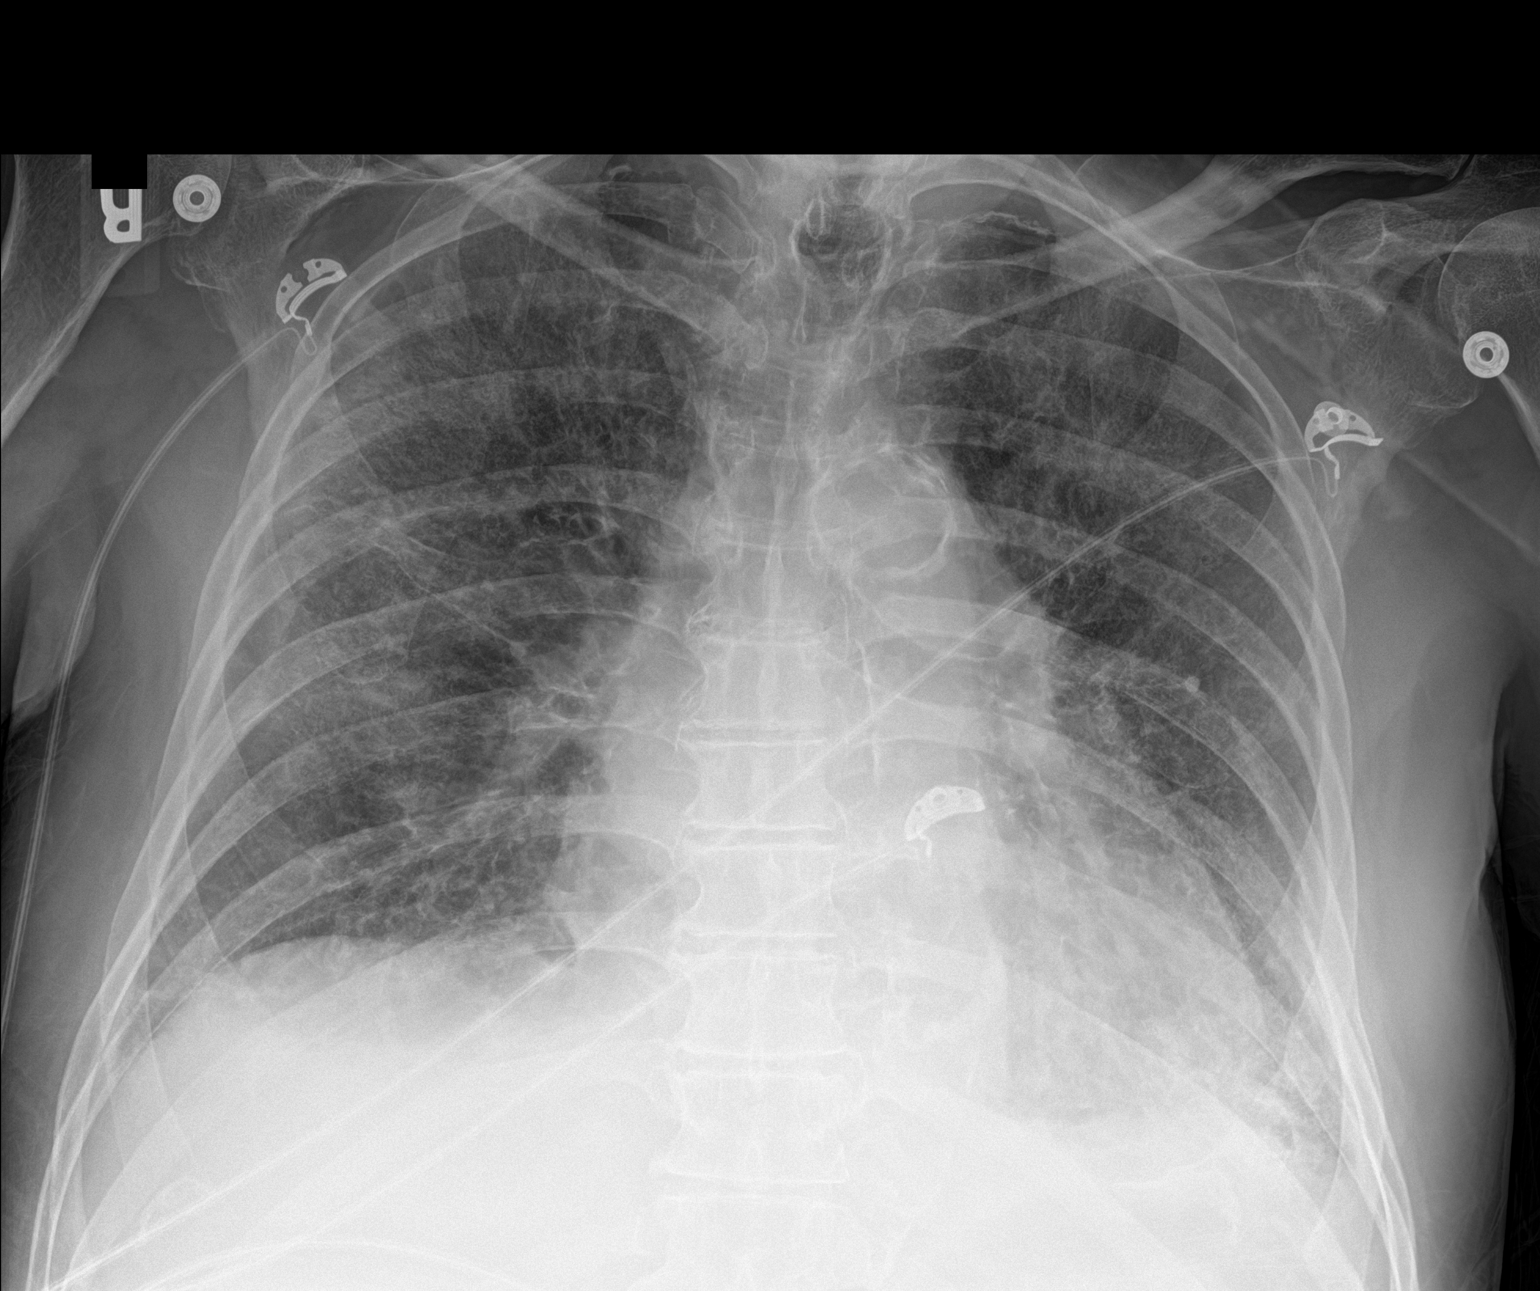

[1 of 1 positions shown; findings below may reference images not displayed]

FINDINGS: Improvement in bilateral airspace disease. Probable clearing edema.
Cardiac enlargement. Bibasilar atelectasis/infiltrate left greater
than right and small effusions
IMPRESSION: Improving bilateral airspace disease most compatible with partial
clearing of pulmonary edema.

## 2019-03-29 IMAGING — DX DG CHEST 1V PORT
1 series · 1 of 1 positions shown · non-contrast
Comparison: Portable chest x-ray of 03/02/2018

CLINICAL DATA: Shortness of breath, labored breathing and wheezing

EXAM:
PORTABLE CHEST 1 VIEW

[chest ap]
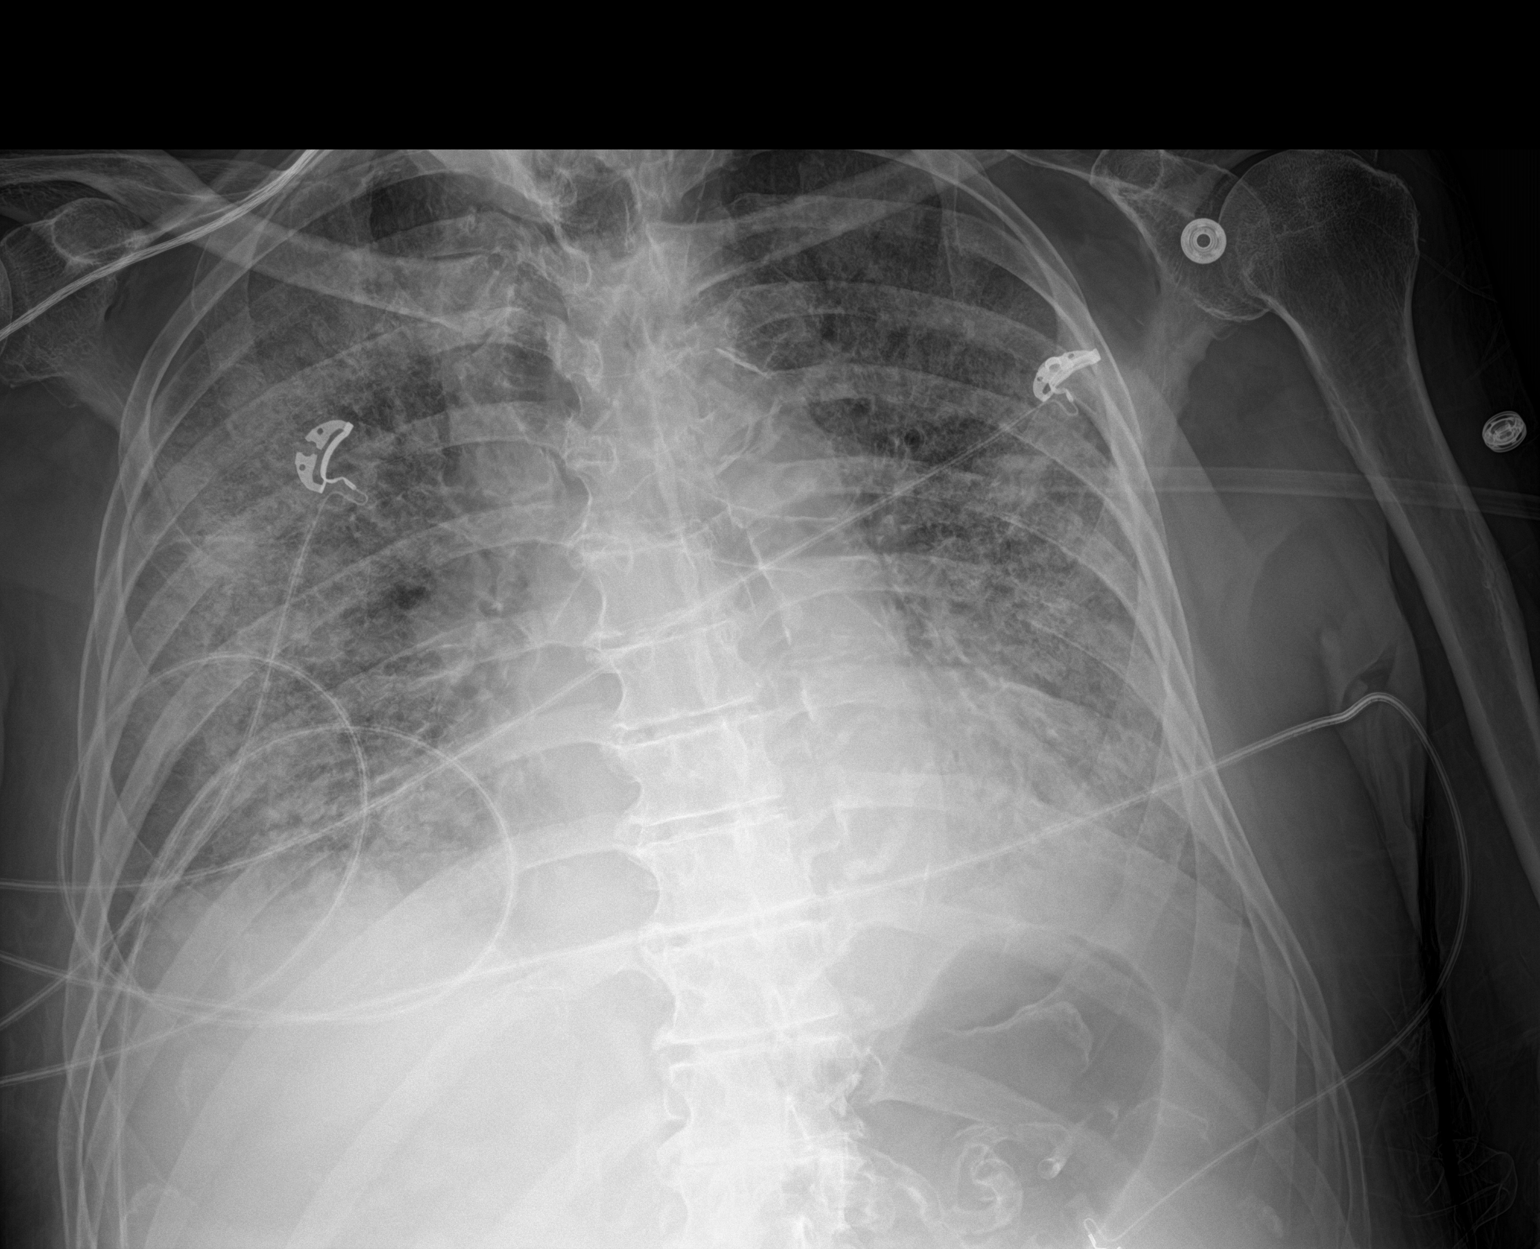

[1 of 1 positions shown; findings below may reference images not displayed]

FINDINGS: There has been worsening of diffuse airspace disease throughout the
lungs most consistent with pneumonia, with some sparing of the
perihilar regions. Small effusions cannot be excluded. Cardiomegaly
is stable. No bony abnormality is noted.
IMPRESSION: Worsening of diffuse airspace disease most consistent with
pneumonia.

## 2020-02-08 IMAGING — US US RENAL
1 series · 14 of 25 positions shown · non-contrast
Comparison: CT, 03/21/2013.

CLINICAL DATA: Acute kidney injury.

EXAM:
RENAL / URINARY TRACT ULTRASOUND COMPLETE

[Series 1: us renal · 0.25mm/px · 14 of 46 slices shown]
[im 1/46]
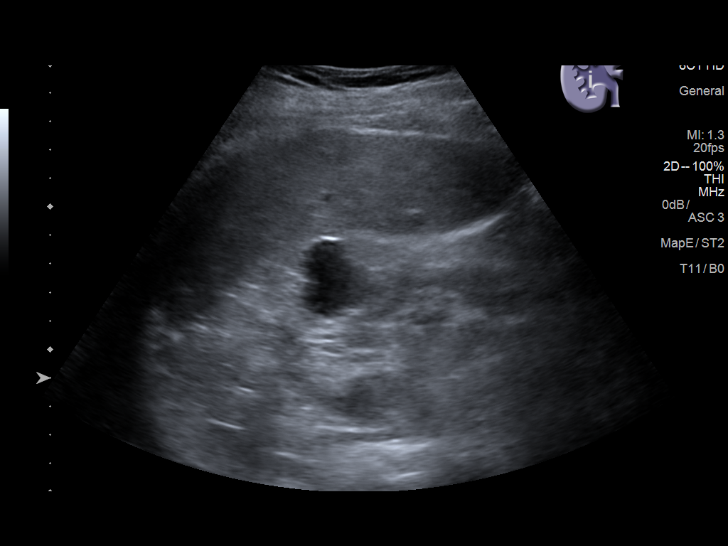
[im 4/46]
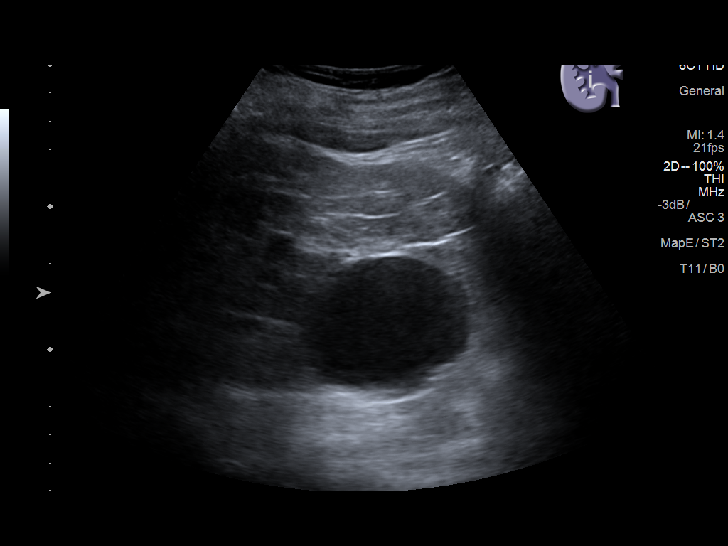
[im 8/46]
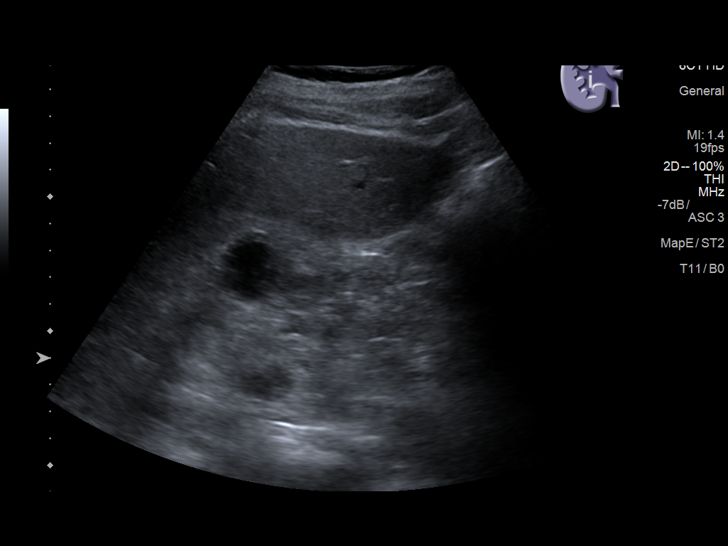
[im 12/46]
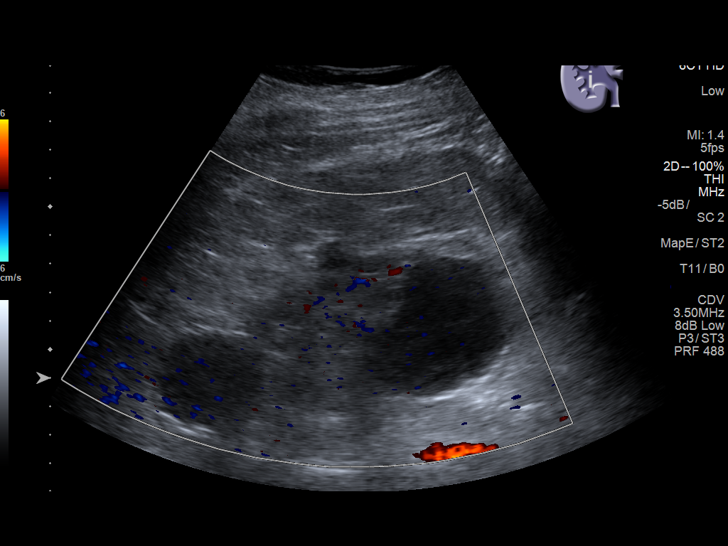
[im 16/46]
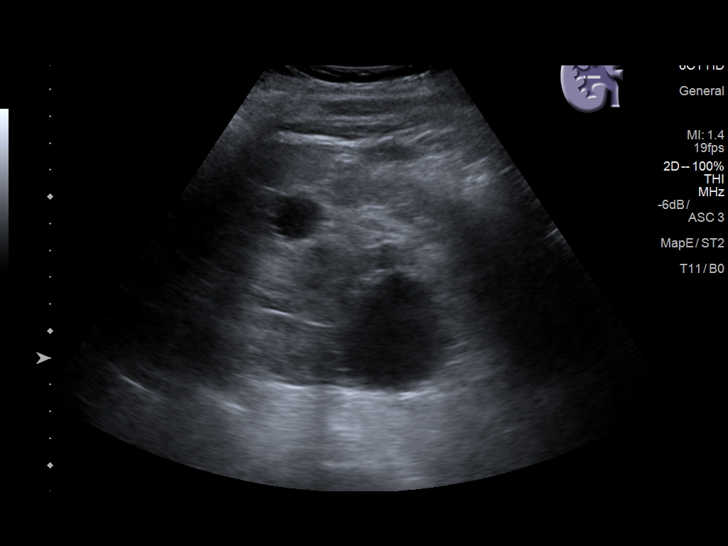
[im 17/46]
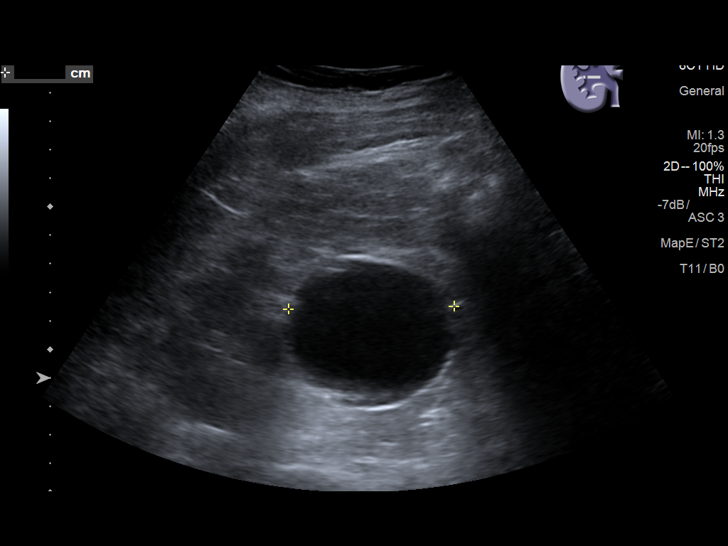
[im 21/46]
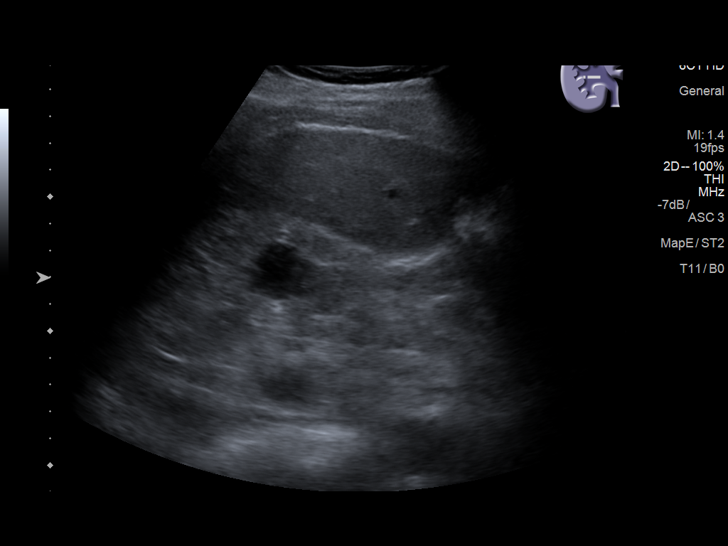
[im 25/46]
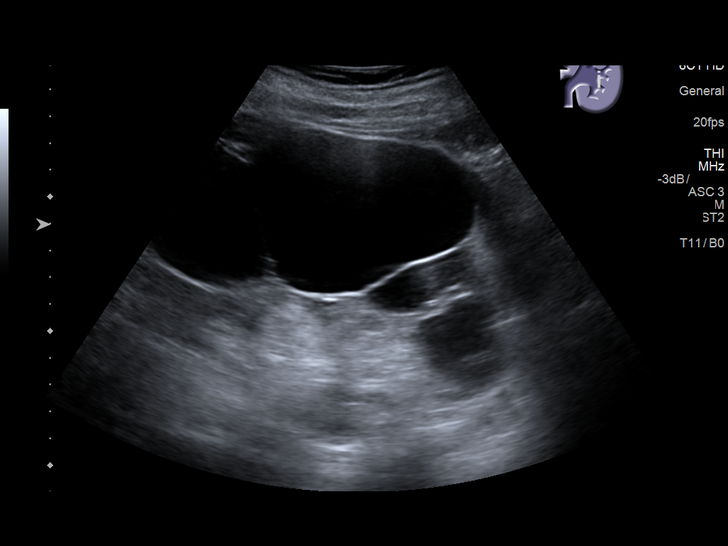
[im 29/46]
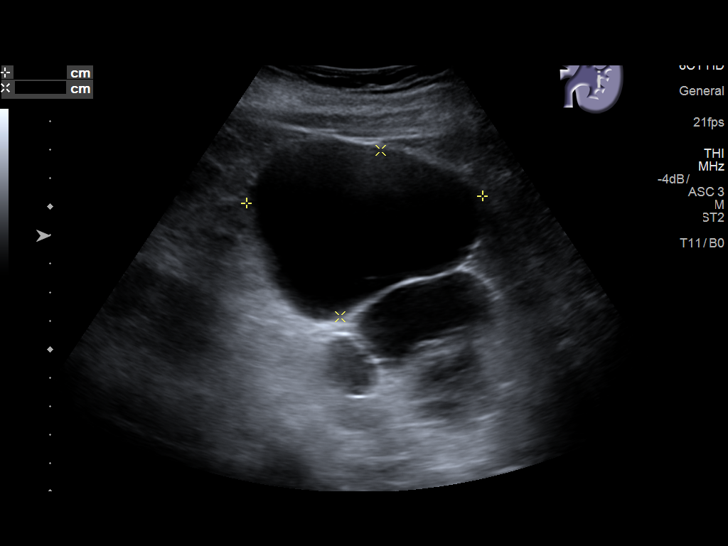
[im 31/46]
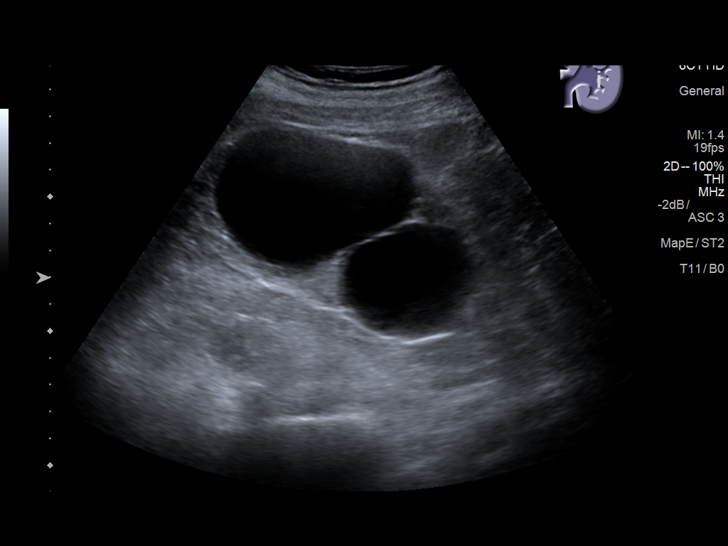
[im 34/46]
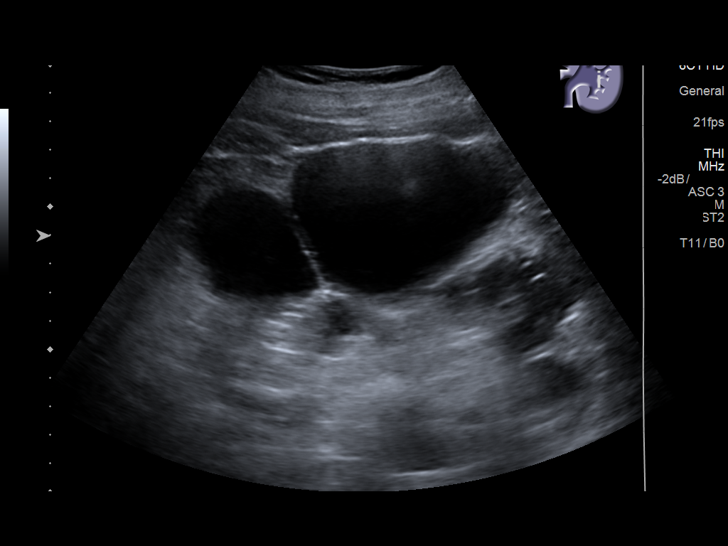
[im 38/46]
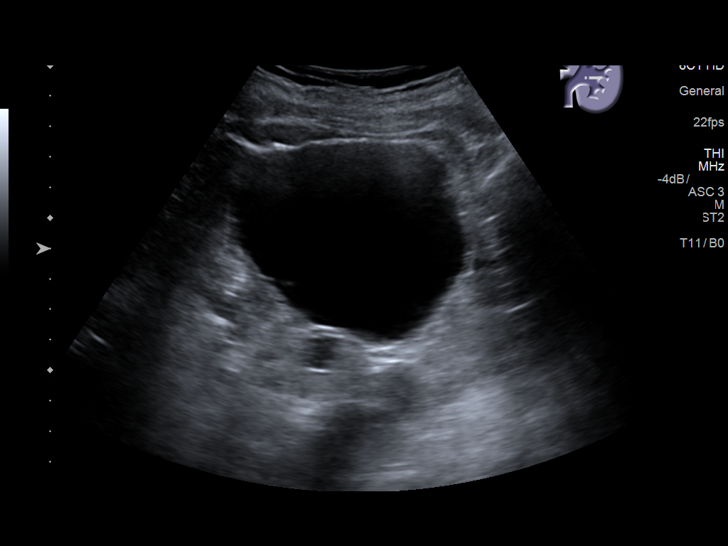
[im 42/46]
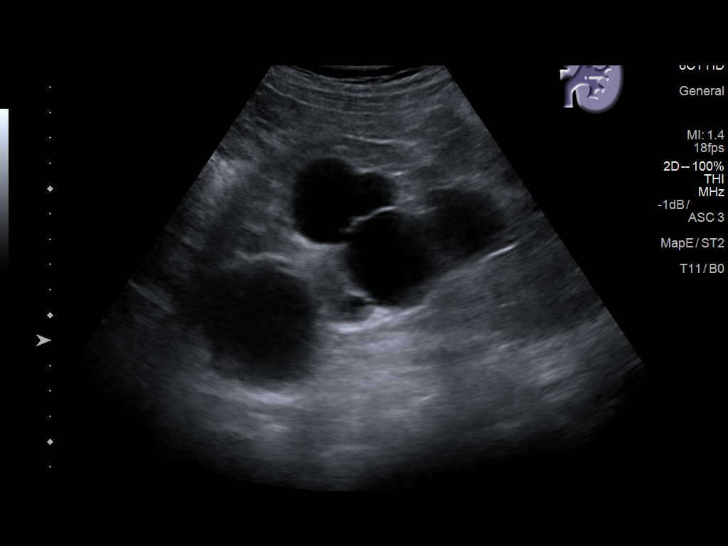
[im 46/46]
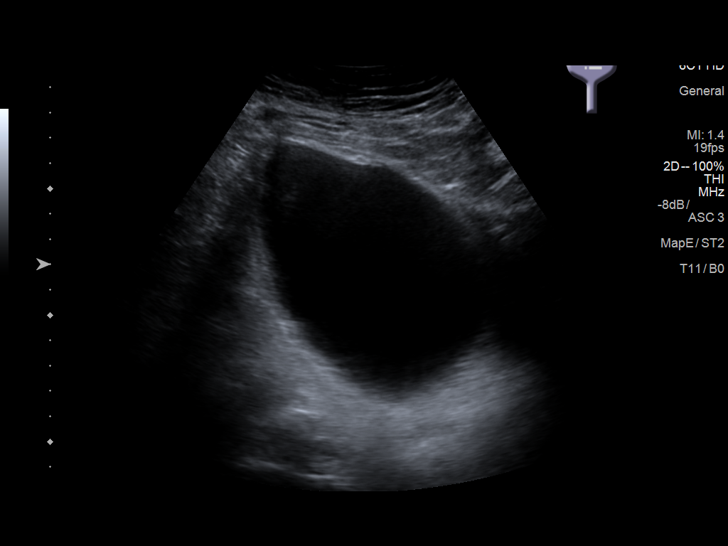

[14 of 25 positions shown; findings below may reference images not displayed]

FINDINGS: Right Kidney:

Length: 13.5 cm. Diffuse cortical thinning. Multiple cysts. From the
lower pole, there is a 5.8 x 5.1 x 4.9 cm cyst. There is a somewhat
complicated appearing cystic mass arising from the upper pole
measuring 2.5 x 1.8 x 2.6 cm. No stones. No hydronephrosis.

Left Kidney:

Length: 18.3 cm. Renal parenchyma not well visualized. There are
numerous cysts. Largest arises from the region of the mid right
kidney measuring 8.3 x 6.0 x 7.3 cm. No solid masses. No stones. No
hydronephrosis.

Bladder:

Appears normal for degree of bladder distention.
IMPRESSION: 1. Bilateral renal cysts, more numerous on the left. On the left
this obscures the renal parenchyma. 1 cyst on the right is mildly
complicated, arising from the upper pole. Recommend follow-up of
this cyst with ultrasound to document stability, in 4-6 months.
2. Thinned renal parenchyma on the right.
3. No hydronephrosis.

## 2022-05-14 ENCOUNTER — Other Ambulatory Visit: Payer: Self-pay | Admitting: Internal Medicine
